# Patient Record
Sex: Female | Born: 1935 | Race: Black or African American
Health system: Southern US, Community
[De-identification: ages and names within clinical notes are randomized; demographics above are authoritative.]

## PROBLEM LIST (undated history)

## (undated) DIAGNOSIS — C8400 Mycosis fungoides, unspecified site: Secondary | ICD-10-CM

## (undated) DIAGNOSIS — I10 Essential (primary) hypertension: Secondary | ICD-10-CM

## (undated) DIAGNOSIS — E039 Hypothyroidism, unspecified: Secondary | ICD-10-CM

## (undated) DIAGNOSIS — H409 Unspecified glaucoma: Secondary | ICD-10-CM

## (undated) DIAGNOSIS — R569 Unspecified convulsions: Secondary | ICD-10-CM

## (undated) DIAGNOSIS — R32 Unspecified urinary incontinence: Secondary | ICD-10-CM

## (undated) DIAGNOSIS — K219 Gastro-esophageal reflux disease without esophagitis: Secondary | ICD-10-CM

## (undated) DIAGNOSIS — I639 Cerebral infarction, unspecified: Secondary | ICD-10-CM

## (undated) DIAGNOSIS — E7800 Pure hypercholesterolemia, unspecified: Secondary | ICD-10-CM

## (undated) HISTORY — DX: Cerebral infarction, unspecified: I63.9

## (undated) HISTORY — DX: Unspecified convulsions: R56.9

## (undated) HISTORY — DX: Pure hypercholesterolemia, unspecified: E78.00

## (undated) HISTORY — DX: Essential (primary) hypertension: I10

## (undated) HISTORY — PX: JOINT REPLACEMENT: SHX530

---

## 2011-08-04 DIAGNOSIS — I10 Essential (primary) hypertension: Secondary | ICD-10-CM | POA: Insufficient documentation

## 2011-08-04 DIAGNOSIS — E785 Hyperlipidemia, unspecified: Secondary | ICD-10-CM | POA: Insufficient documentation

## 2017-04-27 DIAGNOSIS — H401132 Primary open-angle glaucoma, bilateral, moderate stage: Secondary | ICD-10-CM | POA: Insufficient documentation

## 2018-09-12 DIAGNOSIS — C8405 Mycosis fungoides, lymph nodes of inguinal region and lower limb: Secondary | ICD-10-CM | POA: Insufficient documentation

## 2019-01-29 HISTORY — PX: HAND, CLOSED REDUCTION: SHX4185

## 2019-07-31 DIAGNOSIS — K921 Melena: Secondary | ICD-10-CM | POA: Insufficient documentation

## 2019-07-31 DIAGNOSIS — G4089 Other seizures: Secondary | ICD-10-CM | POA: Insufficient documentation

## 2019-07-31 DIAGNOSIS — I639 Cerebral infarction, unspecified: Secondary | ICD-10-CM | POA: Insufficient documentation

## 2021-02-07 ENCOUNTER — Other Ambulatory Visit: Payer: Self-pay

## 2021-02-07 ENCOUNTER — Inpatient Hospital Stay (HOSPITAL_COMMUNITY)
Admission: EM | Admit: 2021-02-07 | Discharge: 2021-02-11 | DRG: 065 | Disposition: A | Discharge status: Skilled Nursing Facility | Payer: Medicare Other | Attending: Internal Medicine | Admitting: Internal Medicine

## 2021-02-07 ENCOUNTER — Encounter (HOSPITAL_COMMUNITY): Payer: Self-pay | Admitting: Neurology

## 2021-02-07 ENCOUNTER — Emergency Department (HOSPITAL_COMMUNITY): Payer: Medicare Other

## 2021-02-07 ENCOUNTER — Observation Stay (HOSPITAL_COMMUNITY): Payer: Medicare Other

## 2021-02-07 DIAGNOSIS — E039 Hypothyroidism, unspecified: Secondary | ICD-10-CM

## 2021-02-07 DIAGNOSIS — R29701 NIHSS score 1: Secondary | ICD-10-CM | POA: Diagnosis present

## 2021-02-07 DIAGNOSIS — I6329 Cerebral infarction due to unspecified occlusion or stenosis of other precerebral arteries: Principal | ICD-10-CM | POA: Diagnosis present

## 2021-02-07 DIAGNOSIS — Z79899 Other long term (current) drug therapy: Secondary | ICD-10-CM

## 2021-02-07 DIAGNOSIS — R32 Unspecified urinary incontinence: Secondary | ICD-10-CM | POA: Diagnosis present

## 2021-02-07 DIAGNOSIS — G459 Transient cerebral ischemic attack, unspecified: Secondary | ICD-10-CM | POA: Diagnosis not present

## 2021-02-07 DIAGNOSIS — I10 Essential (primary) hypertension: Secondary | ICD-10-CM | POA: Diagnosis present

## 2021-02-07 DIAGNOSIS — R4781 Slurred speech: Secondary | ICD-10-CM | POA: Diagnosis present

## 2021-02-07 DIAGNOSIS — R059 Cough, unspecified: Secondary | ICD-10-CM

## 2021-02-07 DIAGNOSIS — R9431 Abnormal electrocardiogram [ECG] [EKG]: Secondary | ICD-10-CM | POA: Diagnosis present

## 2021-02-07 DIAGNOSIS — Z823 Family history of stroke: Secondary | ICD-10-CM

## 2021-02-07 DIAGNOSIS — N390 Urinary tract infection, site not specified: Secondary | ICD-10-CM | POA: Diagnosis present

## 2021-02-07 DIAGNOSIS — I69354 Hemiplegia and hemiparesis following cerebral infarction affecting left non-dominant side: Secondary | ICD-10-CM

## 2021-02-07 DIAGNOSIS — R1310 Dysphagia, unspecified: Secondary | ICD-10-CM | POA: Diagnosis present

## 2021-02-07 DIAGNOSIS — H409 Unspecified glaucoma: Secondary | ICD-10-CM | POA: Diagnosis present

## 2021-02-07 DIAGNOSIS — K5900 Constipation, unspecified: Secondary | ICD-10-CM | POA: Diagnosis present

## 2021-02-07 DIAGNOSIS — Z79890 Hormone replacement therapy (postmenopausal): Secondary | ICD-10-CM

## 2021-02-07 DIAGNOSIS — K219 Gastro-esophageal reflux disease without esophagitis: Secondary | ICD-10-CM | POA: Diagnosis present

## 2021-02-07 DIAGNOSIS — W19XXXA Unspecified fall, initial encounter: Secondary | ICD-10-CM | POA: Diagnosis present

## 2021-02-07 DIAGNOSIS — R471 Dysarthria and anarthria: Secondary | ICD-10-CM | POA: Diagnosis present

## 2021-02-07 DIAGNOSIS — Z8249 Family history of ischemic heart disease and other diseases of the circulatory system: Secondary | ICD-10-CM

## 2021-02-07 DIAGNOSIS — I69392 Facial weakness following cerebral infarction: Secondary | ICD-10-CM

## 2021-02-07 DIAGNOSIS — E785 Hyperlipidemia, unspecified: Secondary | ICD-10-CM | POA: Diagnosis present

## 2021-02-07 DIAGNOSIS — I639 Cerebral infarction, unspecified: Secondary | ICD-10-CM | POA: Diagnosis present

## 2021-02-07 DIAGNOSIS — Z20822 Contact with and (suspected) exposure to covid-19: Secondary | ICD-10-CM | POA: Diagnosis present

## 2021-02-07 DIAGNOSIS — G40909 Epilepsy, unspecified, not intractable, without status epilepticus: Secondary | ICD-10-CM | POA: Diagnosis present

## 2021-02-07 DIAGNOSIS — C8400 Mycosis fungoides, unspecified site: Secondary | ICD-10-CM

## 2021-02-07 DIAGNOSIS — I6350 Cerebral infarction due to unspecified occlusion or stenosis of unspecified cerebral artery: Secondary | ICD-10-CM

## 2021-02-07 DIAGNOSIS — S62101A Fracture of unspecified carpal bone, right wrist, initial encounter for closed fracture: Secondary | ICD-10-CM | POA: Diagnosis present

## 2021-02-07 DIAGNOSIS — Z880 Allergy status to penicillin: Secondary | ICD-10-CM

## 2021-02-07 HISTORY — DX: Unspecified glaucoma: H40.9

## 2021-02-07 HISTORY — DX: Mycosis fungoides, unspecified site: C84.00

## 2021-02-07 HISTORY — DX: Essential (primary) hypertension: I10

## 2021-02-07 HISTORY — DX: Hypothyroidism, unspecified: E03.9

## 2021-02-07 HISTORY — DX: Gastro-esophageal reflux disease without esophagitis: K21.9

## 2021-02-07 HISTORY — DX: Unspecified convulsions: R56.9

## 2021-02-07 HISTORY — DX: Unspecified urinary incontinence: R32

## 2021-02-07 LAB — APTT: aPTT: 29 seconds (ref 24–36)

## 2021-02-07 LAB — I-STAT CHEM 8, ED
BUN: 20 mg/dL (ref 8–23)
Calcium, Ion: 1.11 mmol/L — ABNORMAL LOW (ref 1.15–1.40)
Chloride: 102 mmol/L (ref 98–111)
Creatinine, Ser: 1 mg/dL (ref 0.44–1.00)
Glucose, Bld: 116 mg/dL — ABNORMAL HIGH (ref 70–99)
HCT: 32 % — ABNORMAL LOW (ref 36.0–46.0)
Hemoglobin: 10.9 g/dL — ABNORMAL LOW (ref 12.0–15.0)
Potassium: 3.9 mmol/L (ref 3.5–5.1)
Sodium: 140 mmol/L (ref 135–145)
TCO2: 32 mmol/L (ref 22–32)

## 2021-02-07 LAB — CBC
HCT: 34.8 % — ABNORMAL LOW (ref 36.0–46.0)
Hemoglobin: 11.1 g/dL — ABNORMAL LOW (ref 12.0–15.0)
MCH: 26.3 pg (ref 26.0–34.0)
MCHC: 31.9 g/dL (ref 30.0–36.0)
MCV: 82.5 fL (ref 80.0–100.0)
Platelets: 263 10*3/uL (ref 150–400)
RBC: 4.22 MIL/uL (ref 3.87–5.11)
RDW: 13.8 % (ref 11.5–15.5)
WBC: 4.9 10*3/uL (ref 4.0–10.5)
nRBC: 0 % (ref 0.0–0.2)

## 2021-02-07 LAB — PROTIME-INR
INR: 1 (ref 0.8–1.2)
Prothrombin Time: 13.1 seconds (ref 11.4–15.2)

## 2021-02-07 LAB — URINALYSIS, ROUTINE W REFLEX MICROSCOPIC
Bilirubin Urine: NEGATIVE
Glucose, UA: NEGATIVE mg/dL
Hgb urine dipstick: NEGATIVE
Ketones, ur: NEGATIVE mg/dL
Nitrite: NEGATIVE
Protein, ur: NEGATIVE mg/dL
Specific Gravity, Urine: 1.013 (ref 1.005–1.030)
pH: 7 (ref 5.0–8.0)

## 2021-02-07 LAB — ETHANOL: Alcohol, Ethyl (B): <10 mg/dL (ref <10)

## 2021-02-07 LAB — COMPREHENSIVE METABOLIC PANEL
ALT: 20 U/L (ref 0–44)
AST: 30 U/L (ref 15–41)
Albumin: 3.3 g/dL — ABNORMAL LOW (ref 3.5–5.0)
Alkaline Phosphatase: 62 U/L (ref 38–126)
Anion gap: 10 (ref 5–15)
BUN: 15 mg/dL (ref 8–23)
CO2: 27 mmol/L (ref 22–32)
Calcium: 9 mg/dL (ref 8.9–10.3)
Chloride: 101 mmol/L (ref 98–111)
Creatinine, Ser: 1.01 mg/dL — ABNORMAL HIGH (ref 0.44–1.00)
GFR, Estimated: 52 mL/min — ABNORMAL LOW (ref >60)
Glucose, Bld: 117 mg/dL — ABNORMAL HIGH (ref 70–99)
Potassium: 3.9 mmol/L (ref 3.5–5.1)
Sodium: 138 mmol/L (ref 135–145)
Total Bilirubin: 0.7 mg/dL (ref 0.3–1.2)
Total Protein: 7 g/dL (ref 6.5–8.1)

## 2021-02-07 LAB — DIFFERENTIAL
Abs Immature Granulocytes: 0.02 10*3/uL (ref 0.00–0.07)
Basophils Absolute: 0 10*3/uL (ref 0.0–0.1)
Basophils Relative: 1 %
Eosinophils Absolute: 0.1 10*3/uL (ref 0.0–0.5)
Eosinophils Relative: 1 %
Immature Granulocytes: 0 %
Lymphocytes Relative: 15 %
Lymphs Abs: 0.7 10*3/uL (ref 0.7–4.0)
Monocytes Absolute: 0.5 10*3/uL (ref 0.1–1.0)
Monocytes Relative: 11 %
Neutro Abs: 3.5 10*3/uL (ref 1.7–7.7)
Neutrophils Relative %: 72 %

## 2021-02-07 LAB — LIPID PANEL
Cholesterol: 202 mg/dL — ABNORMAL HIGH (ref 0–200)
HDL: 43 mg/dL (ref >40)
LDL Cholesterol: 138 mg/dL — ABNORMAL HIGH (ref 0–99)
Total CHOL/HDL Ratio: 4.7 RATIO
Triglycerides: 105 mg/dL (ref <150)
VLDL: 21 mg/dL (ref 0–40)

## 2021-02-07 LAB — RAPID URINE DRUG SCREEN, HOSP PERFORMED
Amphetamines: NOT DETECTED
Barbiturates: NOT DETECTED
Benzodiazepines: NOT DETECTED
Cocaine: NOT DETECTED
Opiates: NOT DETECTED
Tetrahydrocannabinol: NOT DETECTED

## 2021-02-07 LAB — HEMOGLOBIN A1C
Hgb A1c MFr Bld: 6.2 % — ABNORMAL HIGH (ref 4.8–5.6)
Mean Plasma Glucose: 131.24 mg/dL

## 2021-02-07 LAB — RESP PANEL BY RT-PCR (FLU A&B, COVID) ARPGX2
Influenza A by PCR: NEGATIVE
Influenza B by PCR: NEGATIVE
SARS Coronavirus 2 by RT PCR: NEGATIVE

## 2021-02-07 IMAGING — CT CT HEAD CODE STROKE
4 series · 16 of 47 positions shown, 18 images · non-contrast
Comparison: None.

CLINICAL DATA: Code stroke.

EXAM:
CT HEAD WITHOUT CONTRAST
TECHNIQUE: Contiguous axial images were obtained from the base of the skull
through the vertex without intravenous contrast.

[Series 3: head wo · axial · 0.43mm/px · z∈[-112,-8]mm · 7 of 29 slices shown, 9 images]
[im 4/29  brain]
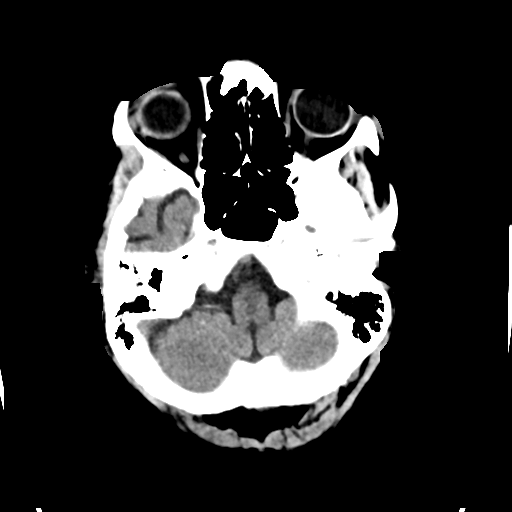
[im 4/29  bone]
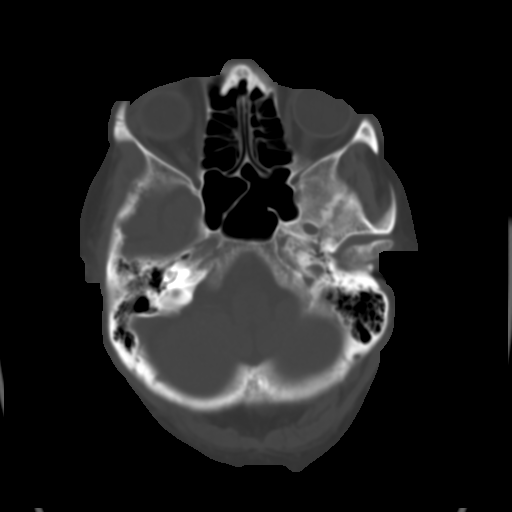
[im 8/29  brain]
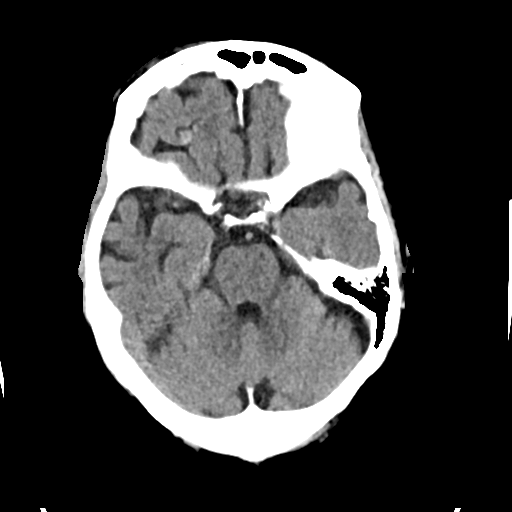
[im 11/29  brain]
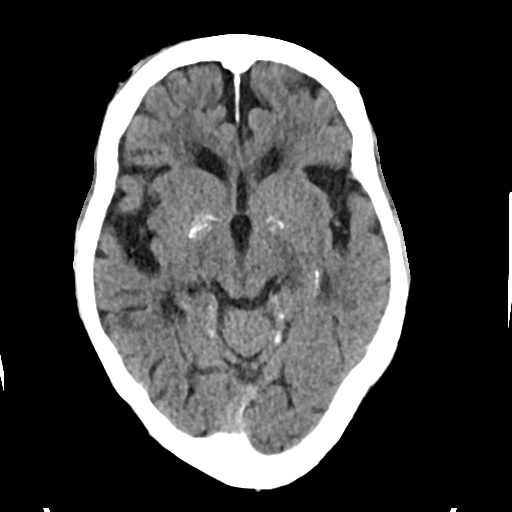
[im 15/29  brain]
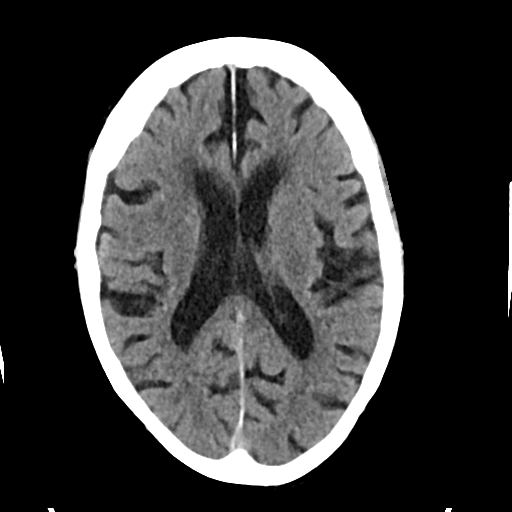
[im 18/29  brain]
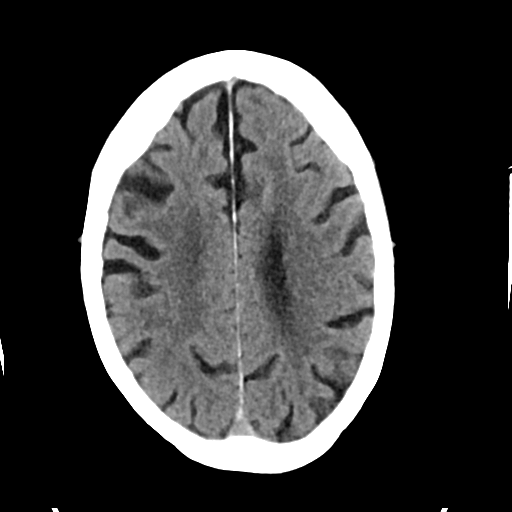
[im 18/29  bone]
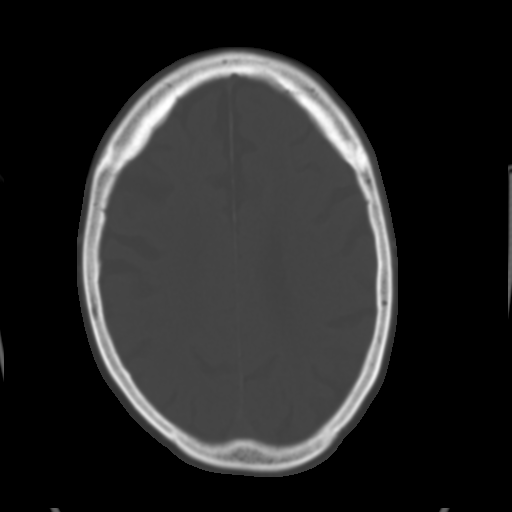
[im 22/29  brain]
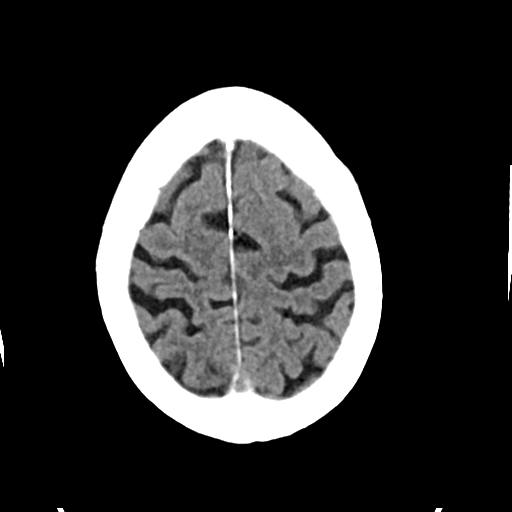
[im 25/29  brain]
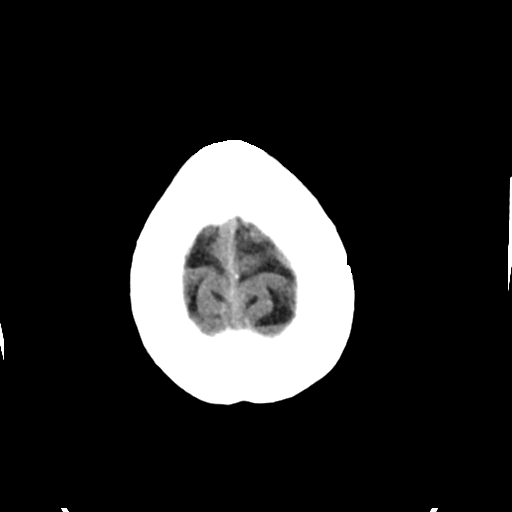

[Series 4: head bone · axial · 0.43mm/px · z∈[-114,-86]mm · 3 of 71 slices shown]
[im 8/71  bone]
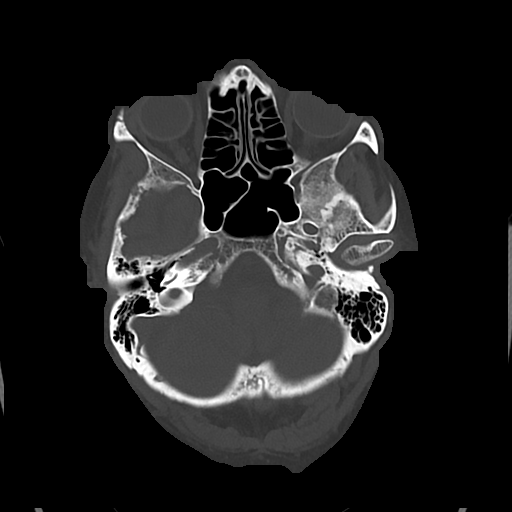
[im 15/71  bone]
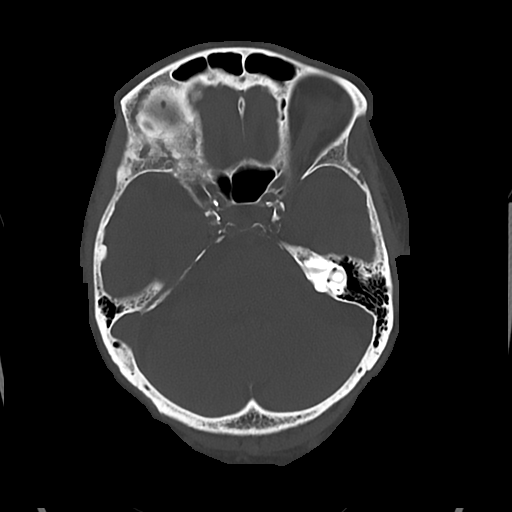
[im 22/71  bone]
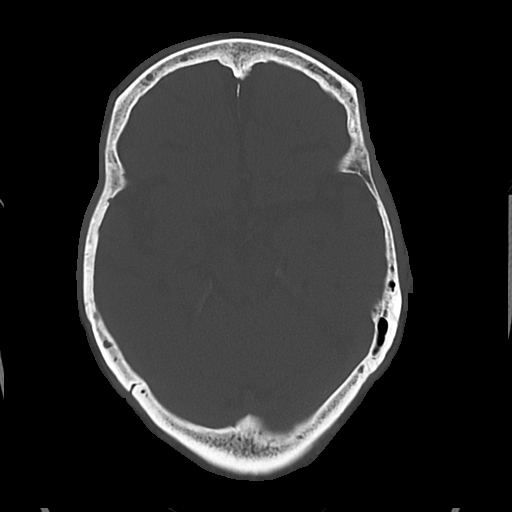

[Series 5: cor soft · coronal · 0.30mm/px · 3 of 68 slices shown]
[im 23/68  brain]
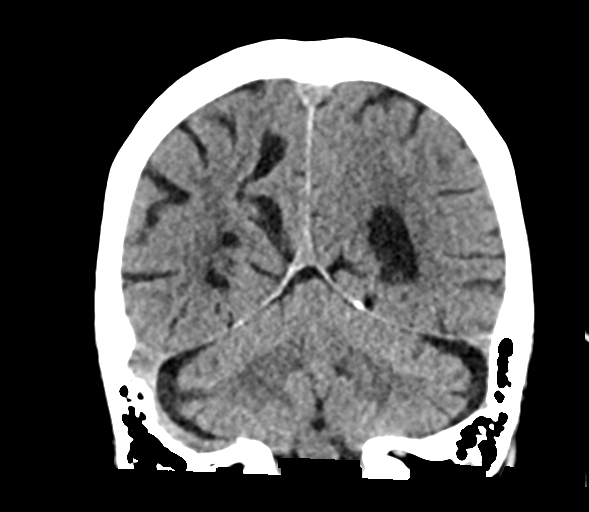
[im 30/68  brain]
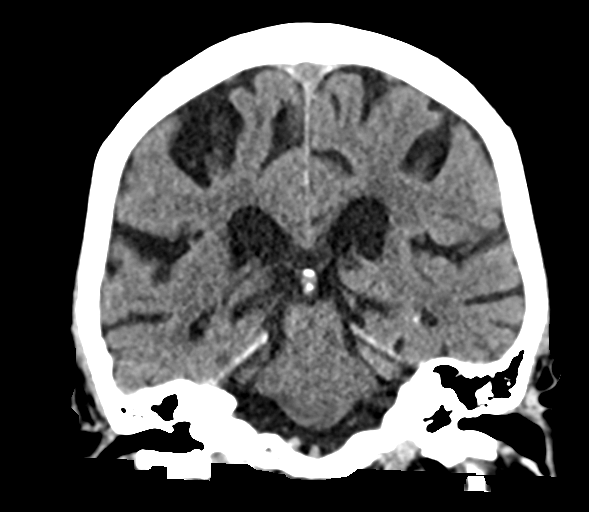
[im 38/68  brain]
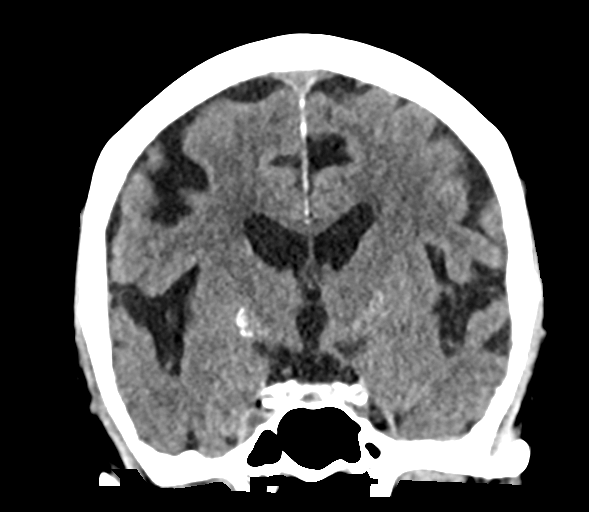

[Series 6: sag soft · sagittal · 0.30mm/px · 3 of 59 slices shown]
[im 20/59  brain]
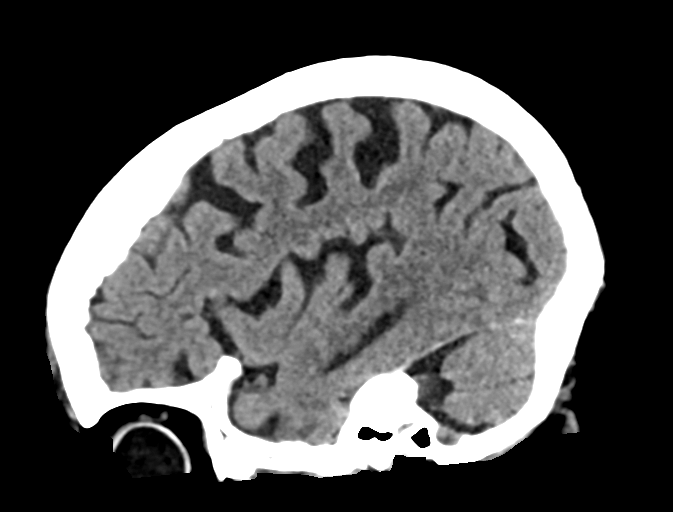
[im 30/59  brain]
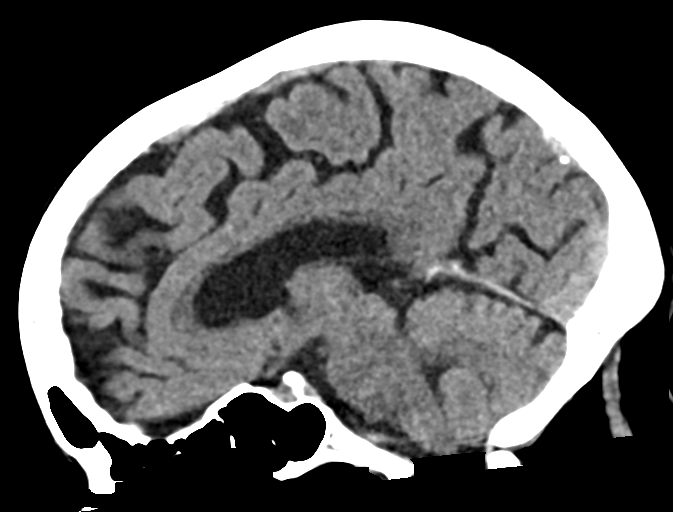
[im 39/59  brain]
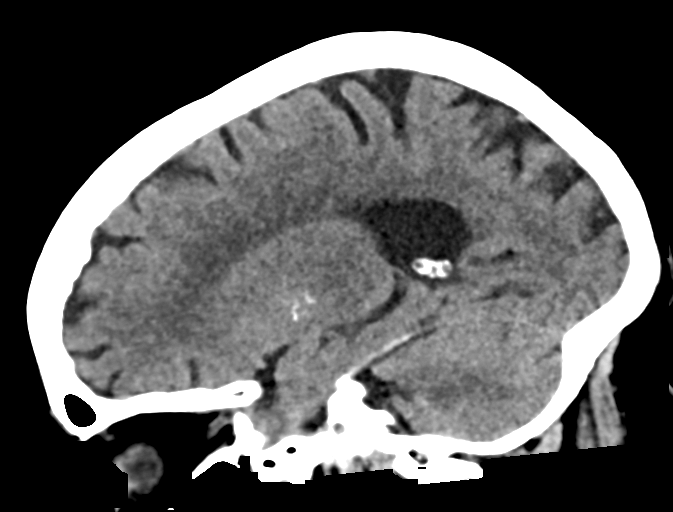

[16 of 47 positions shown; findings below may reference images not displayed]

FINDINGS: Brain: No evidence of acute infarction, hemorrhage, hydrocephalus,
extra-axial collection or mass lesion/mass effect. Generalized brain
atrophy. Chronic small vessel ischemic type low-density in the deep
cerebral white matter.

Vascular: No hyperdense vessel or unexpected calcification.

Skull: Negative for fracture or focal lesion. Subcutaneous
reticulation superior to the right orbit which could be from
contusion or scar.

Sinuses/Orbits: No acute finding.

Other: These results were communicated to Dr BRUNO at [DATE] BRUNO
[DATE]by text page via the [employer] messaging system.

ASPECTS ([place] Stroke Program Early CT Score)

Not scored without localizing symptoms
IMPRESSION: 1. No acute finding.
2. Generalized atrophy and chronic small vessel ischemia.

## 2021-02-07 IMAGING — MR MR HEAD W/O CM
7 of 11 series · 25 of 48 positions shown · non-contrast
Comparison: Head CT from earlier today

CLINICAL DATA: Stroke-like symptoms and left-sided weakness

EXAM:
MRI HEAD WITHOUT CONTRAST
TECHNIQUE: Multiplanar, multiecho pulse sequences of the brain and surrounding
structures were obtained without intravenous contrast.

[Series 2: DWI · axial · 3.0mm · 0.94mm/px · z∈[-85,+66]mm · 7 of 103 slices shown (1 of 2)]
[im 1/103]
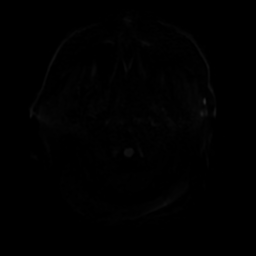
[im 18/103]
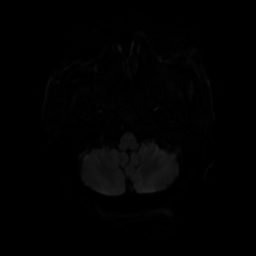
[im 35/103]
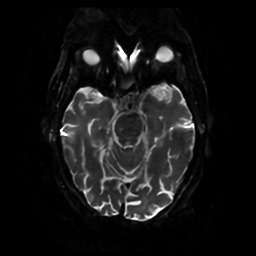
[im 52/103]
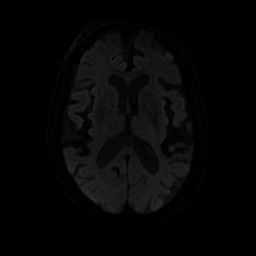
[im 69/103]
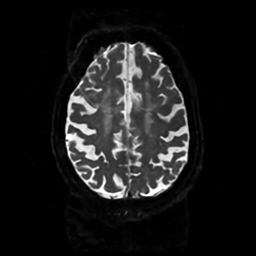
[im 86/103]
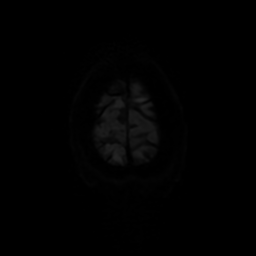
[im 103/103]
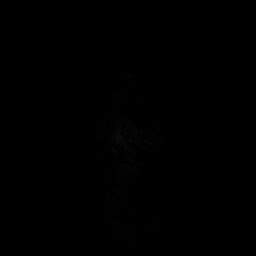

[Series 3: DWI · coronal · 4.0mm · 0.94mm/px · 6 of 77 slices shown (2 of 2)]
[im 1/77]
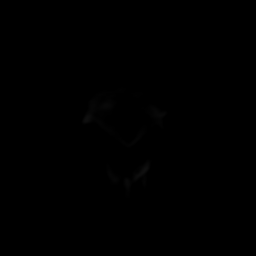
[im 16/77]
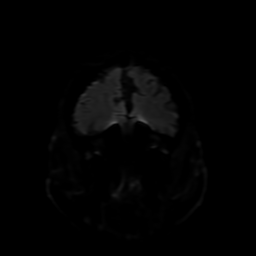
[im 31/77]
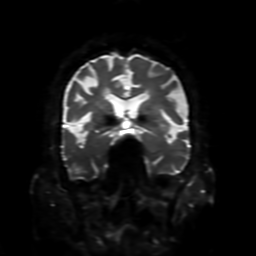
[im 46/77]
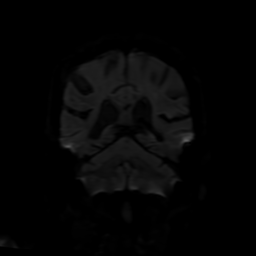
[im 61/77]
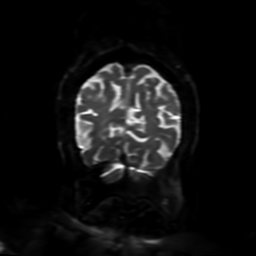
[im 77/77]
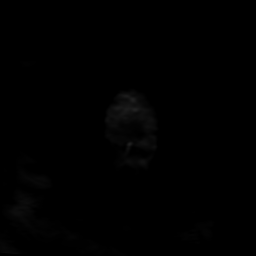

[Series 4: FLAIR · sagittal · 5.0mm · 0.23mm/px · 2 of 26 slices shown (1 of 2)]
[im 1/26]
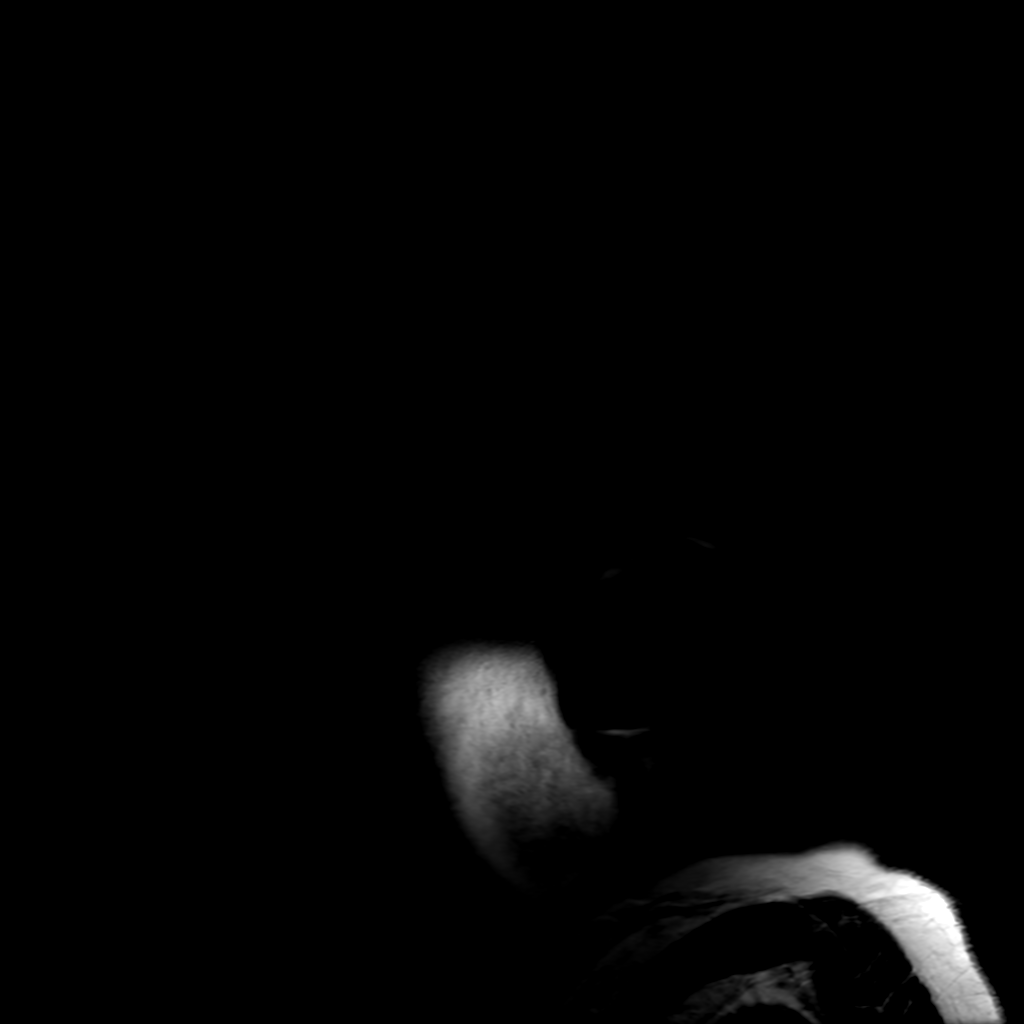
[im 26/26]
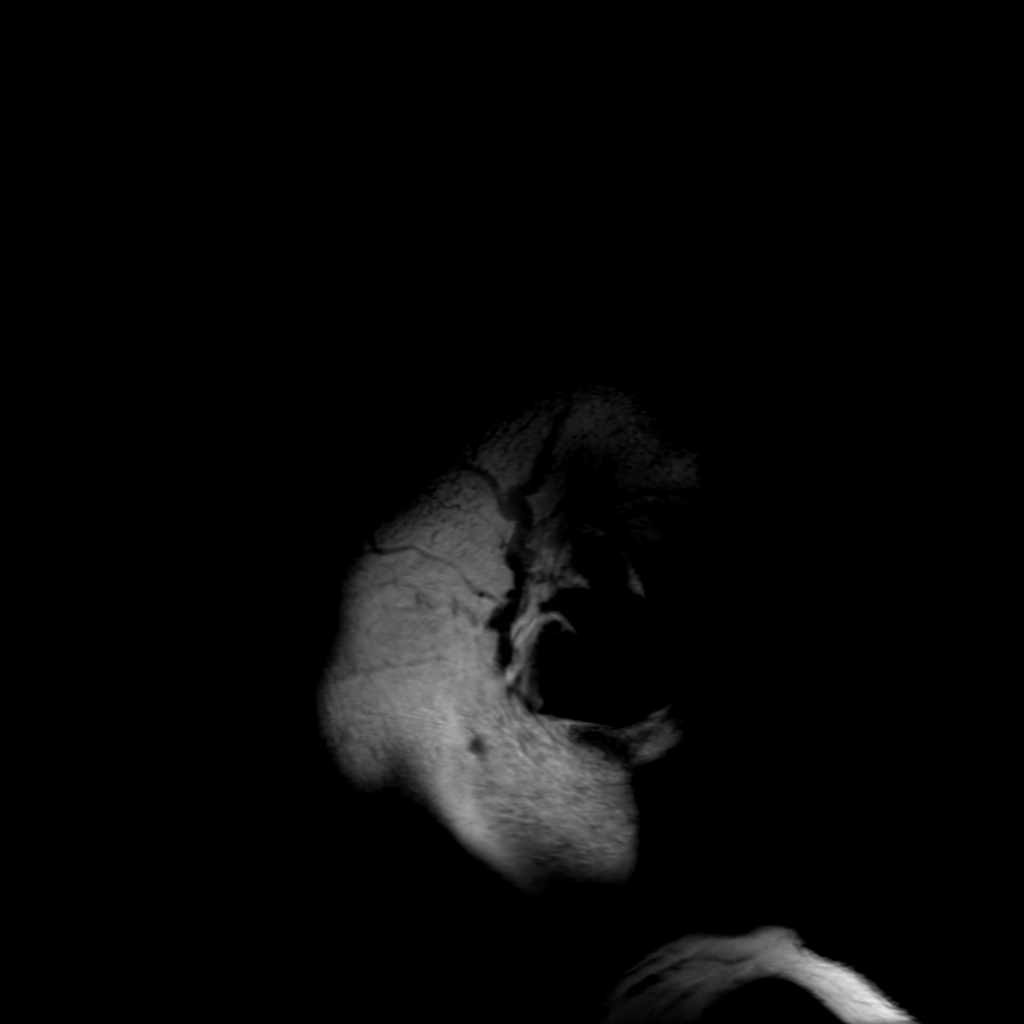

[Series 5: T2 · axial · 5.0mm · 0.23mm/px · 1 of 26 slices shown]
[im 1/26]
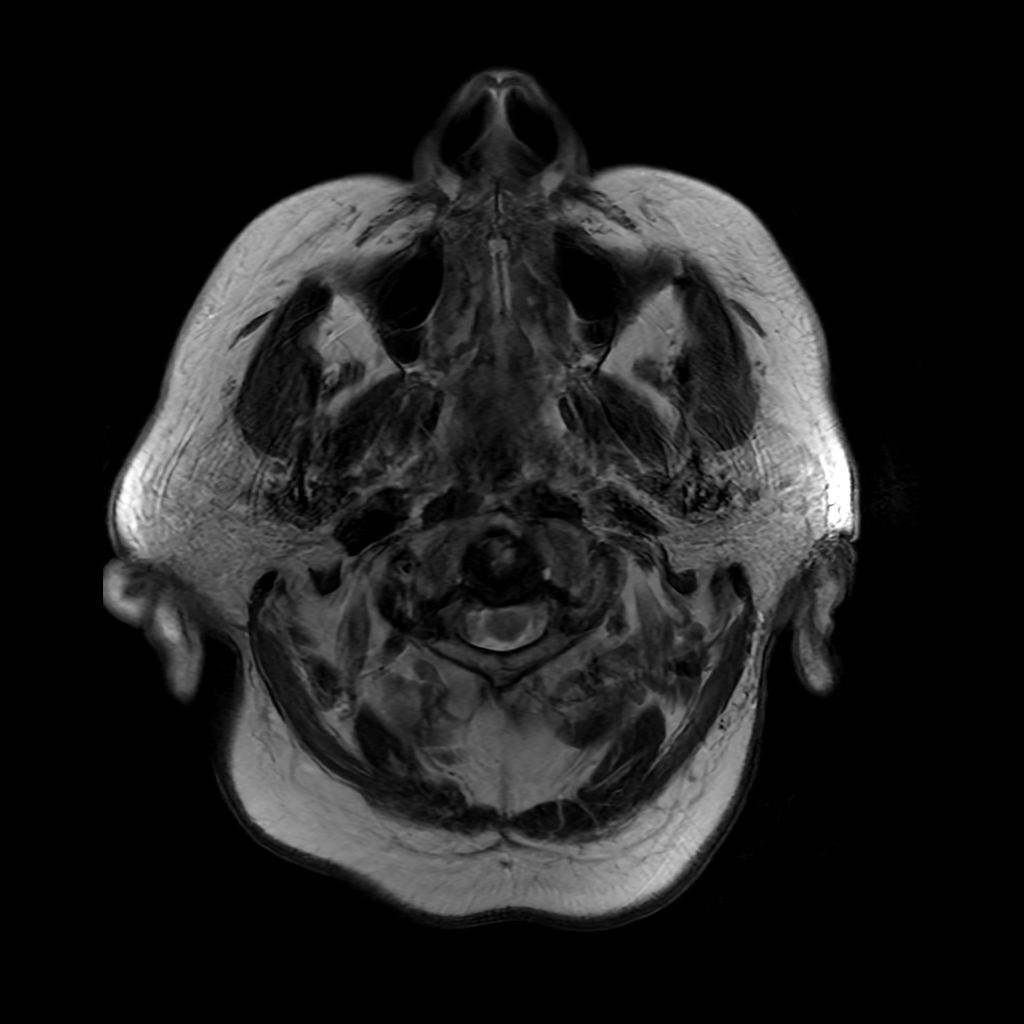

[Series 6: FLAIR · axial · 3.0mm · 0.45mm/px · z∈[-93,+59]mm · 2 of 27 slices shown (2 of 2)]
[im 1/27]
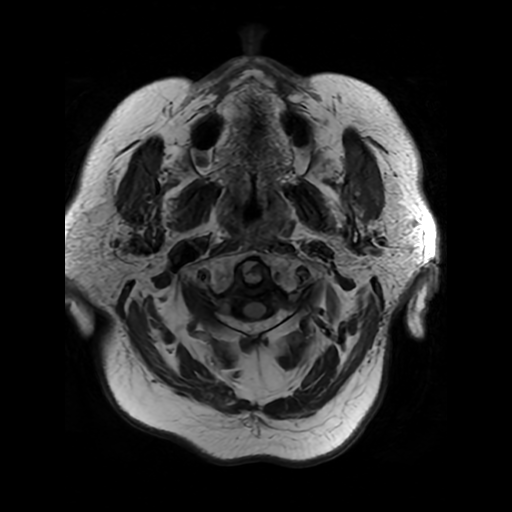
[im 27/27]
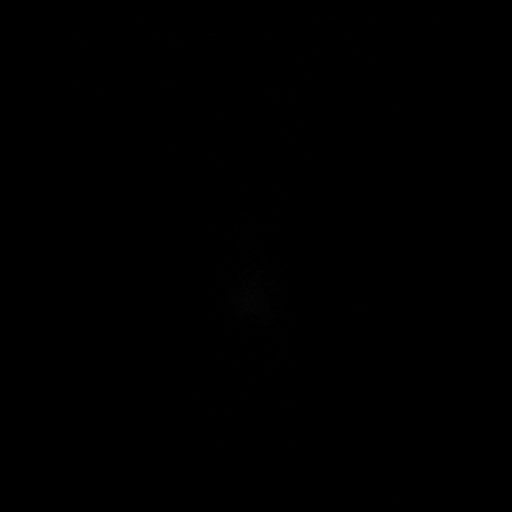

[Series 250: ADC · axial · 3.0mm · 0.94mm/px · z∈[-85,+66]mm · 4 of 52 slices shown (1 of 2)]
[im 1/52]
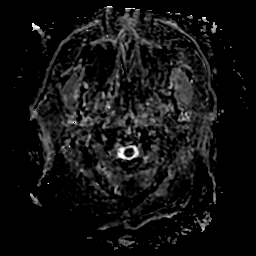
[im 18/52]
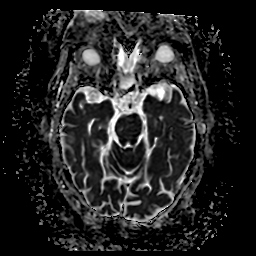
[im 35/52]
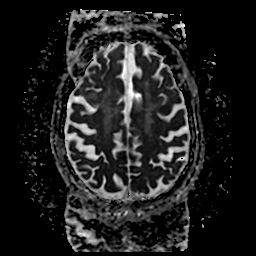
[im 52/52]
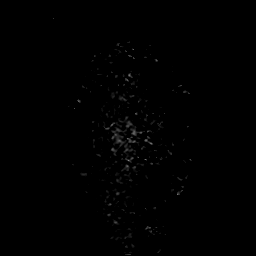

[Series 350: ADC · coronal · 4.0mm · 0.94mm/px · 3 of 38 slices shown (2 of 2)]
[im 1/38]
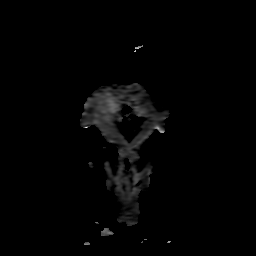
[im 19/38]
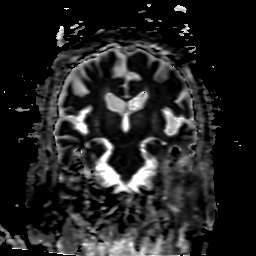
[im 38/38]
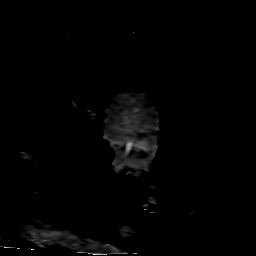

[25 of 48 positions shown; findings below may reference images not displayed]

FINDINGS: Brain: Wedge of acute infarction in the right pons.

Generalized atrophy. Mild-to-moderate periventricular chronic small
vessel ischemia. No collection, hydrocephalus, mass, or acute
hemorrhage

Vascular: Preserved flow voids.

Skull and upper cervical spine: Normal marrow signal. Advanced
cervical spine degeneration with reversal of cervical lordosis and
C3-4, C4-5 retrolisthesis narrowing the spinal canal.

Sinuses/Orbits: Bilateral cataract resection.
IMPRESSION: Acute right pontine infarct.

## 2021-02-07 IMAGING — MR MR MRA HEAD W/O CM
2 series · 18 of 48 positions shown · non-contrast
Comparison: Noncontrast brain MRI performed earlier today
[DATE]. Noncontrast head CT performed earlier today [DATE]

CLINICAL DATA: Stroke, follow-up.

EXAM:
MRA HEAD WITHOUT CONTRAST
TECHNIQUE: Angiographic images of the Circle of Willis were acquired using MRA
technique without intravenous contrast.

[Series 2: ax (id) · axial · 1.0mm · 0.43mm/px · z∈[-60,+21]mm · 17 of 176 slices shown]
[im 1/176]
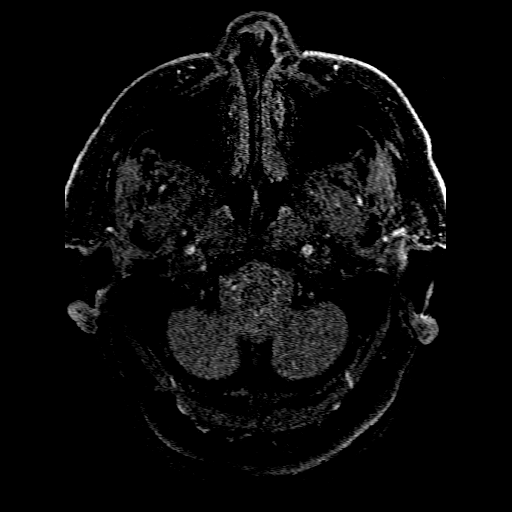
[im 4/176]
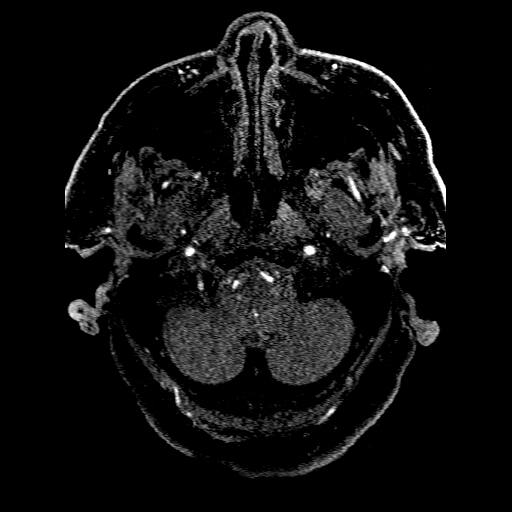
[im 8/176]
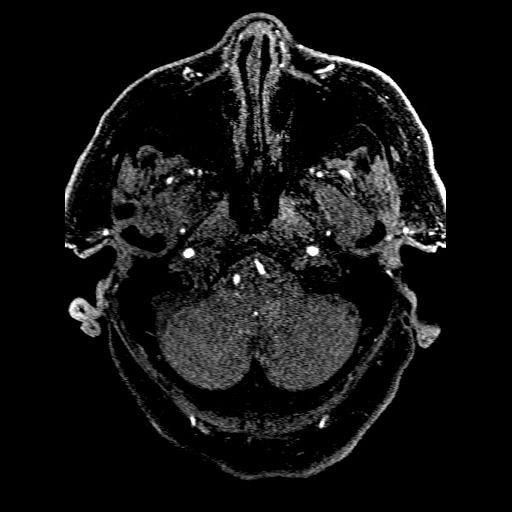
[im 12/176]
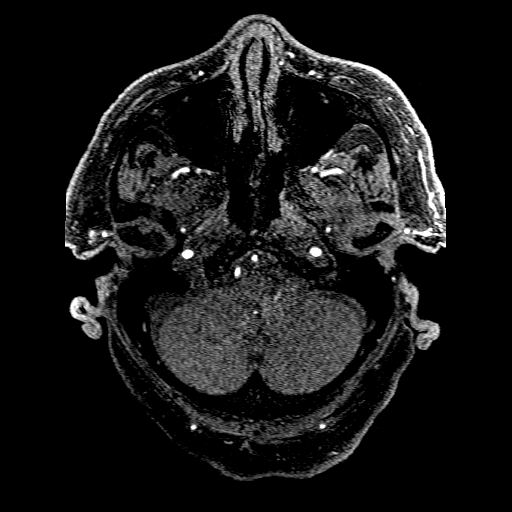
[im 16/176]
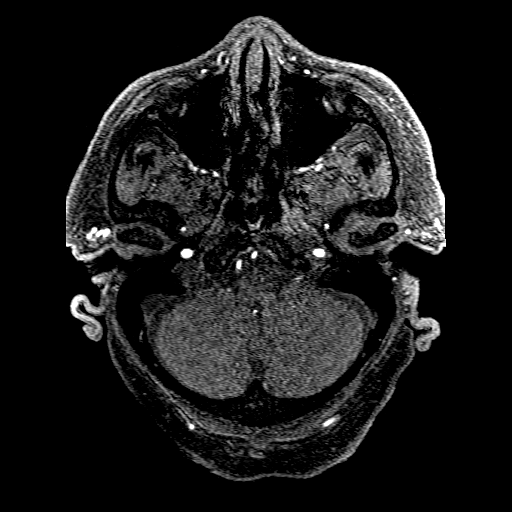
[im 20/176]
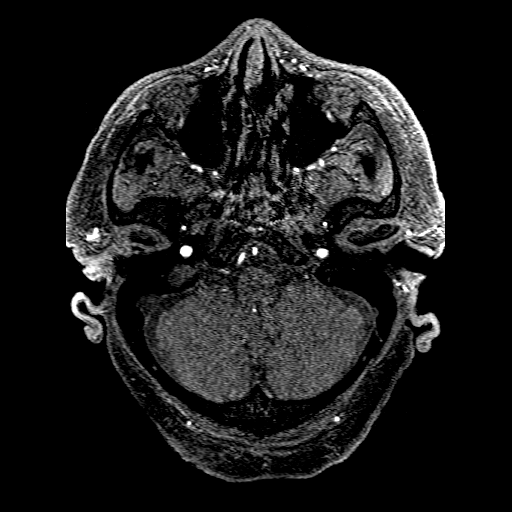
[im 23/176]
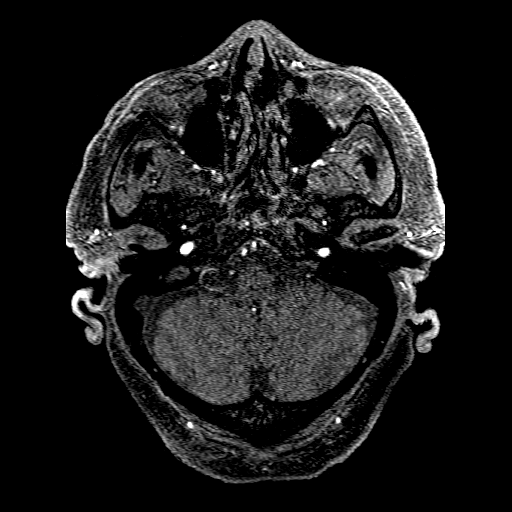
[im 27/176]
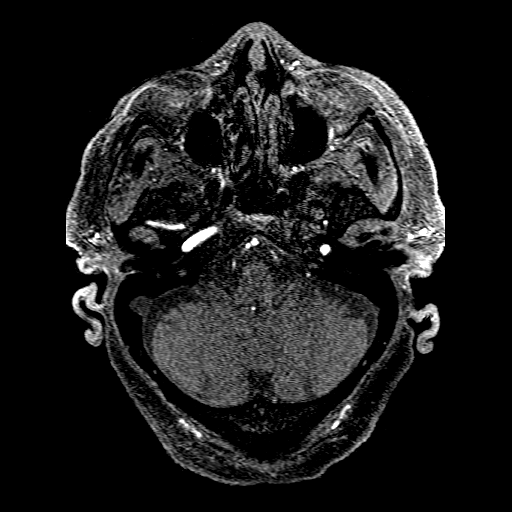
[im 31/176]
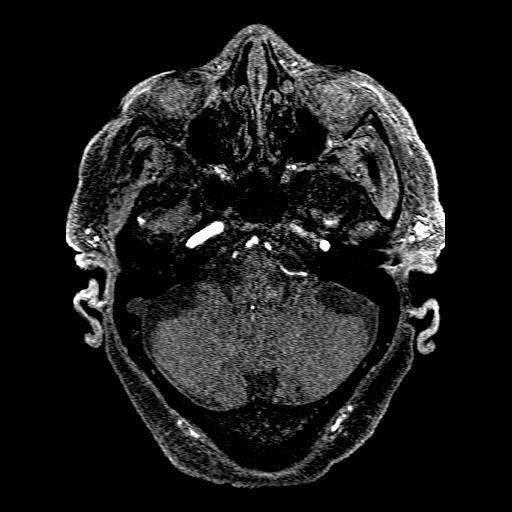
[im 54/176]
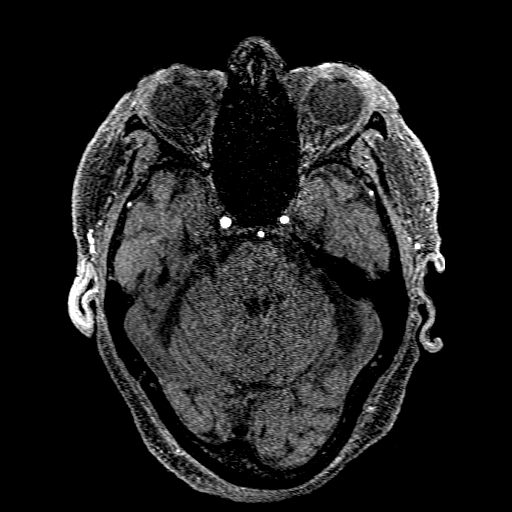
[im 77/176]
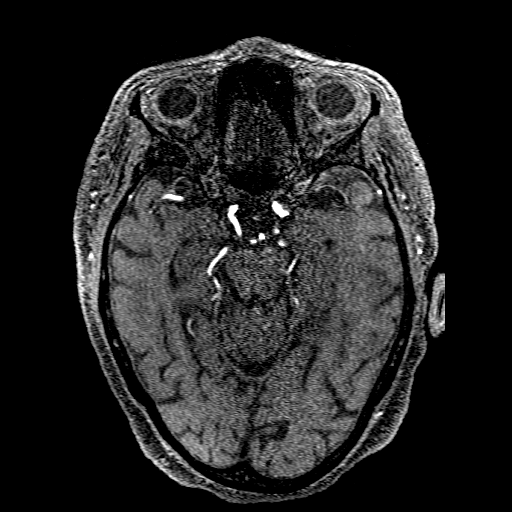
[im 88/176]
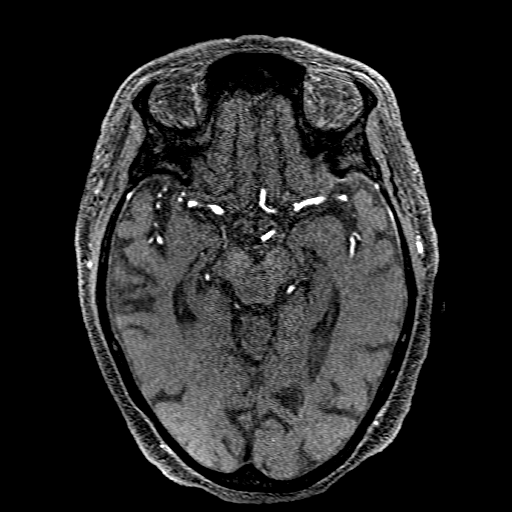
[im 99/176]
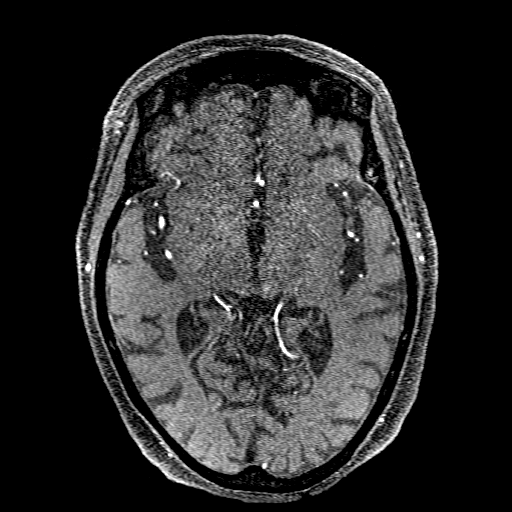
[im 122/176]
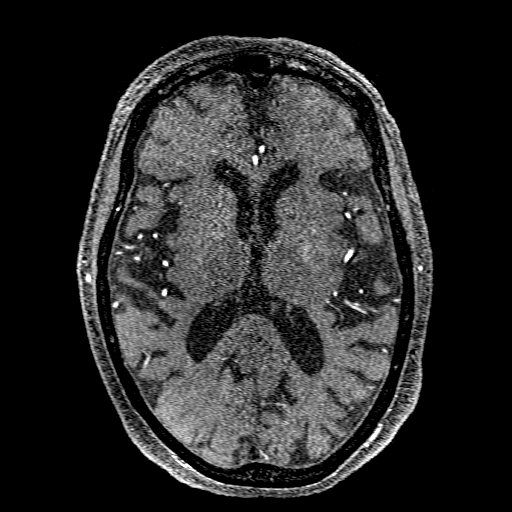
[im 145/176]
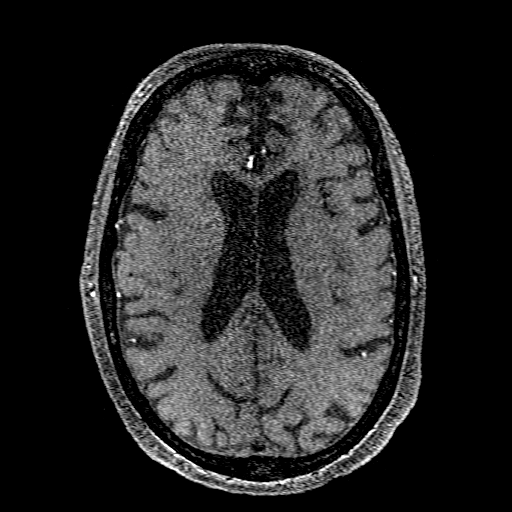
[im 149/176]
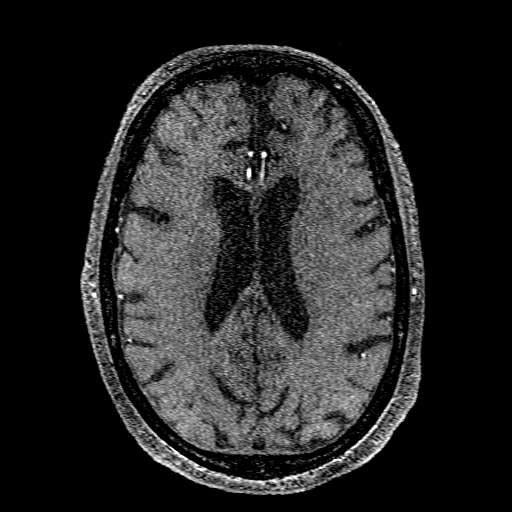
[im 168/176]
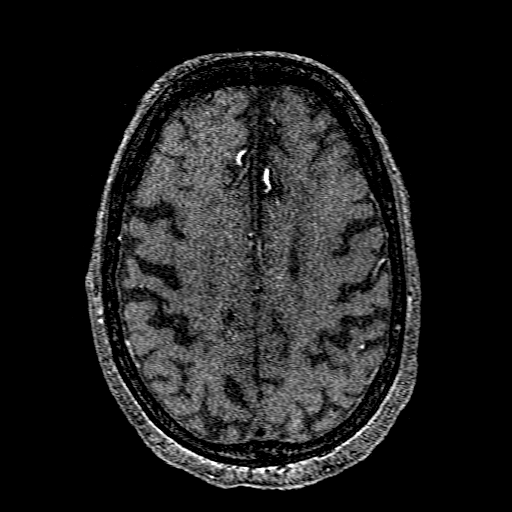

[Series 201: pjn:ax (id) · sagittal · 1.0mm · 0.43mm/px · 1 of 3 slices shown]
[im 1/3]
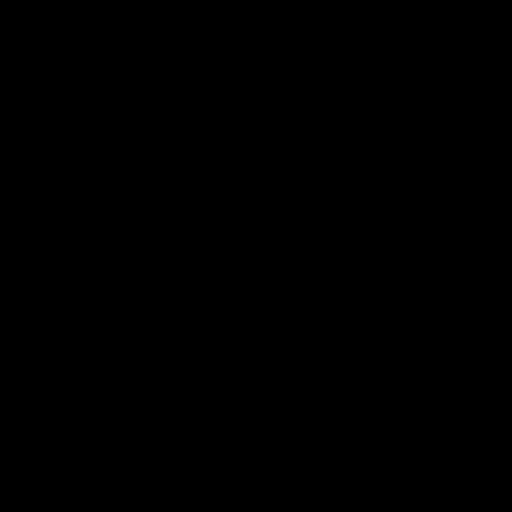

[18 of 48 positions shown; findings below may reference images not displayed]

FINDINGS: Anterior circulation:

The intracranial internal carotid arteries are patent. Mild
atherosclerotic irregularity of both vessels without hemodynamically
significant stenosis. The M1 middle cerebral arteries are patent. No
M2 proximal branch occlusion. The anterior cerebral arteries are
patent. Sites of moderate/severe stenosis within the A2/A3 left
anterior cerebral artery.

2 mm medially projecting vascular protrusion arising from the
cavernous right ICA likely reflecting an aneurysm (series 2, image
65).

1-2 mm inferiorly projecting vascular protrusion arising from the
distal cavernous/paraclinoid right ICA, which could reflect
atherosclerotic irregularity or a small aneurysm (series 2, image
66) (series 257, image 173).

2 mm inferiorly projecting vascular protrusion arising from the
paraclinoid left ICA, which may reflect an aneurysm or the origin of
a partially occluded left posterior communicating artery (series 2,
image 74) series 256, image 200).

Posterior circulation:

The intracranial vertebral arteries are patent. Atherosclerotic
irregularity of both vessels with up to mild stenosis of the
intracranial right vertebral artery, and sites of up to moderate
stenosis of the intracranial left vertebral artery. The basilar
artery is patent. Mild atherosclerotic irregularity of the basilar
artery. The posterior cerebral arteries are patent. Fetal origin
right posterior cerebral artery. Moderate stenosis within the right
PCA at the P1/P2 junction. Moderate stenosis within the P3 right
posterior cerebral artery. There is also atherosclerotic
irregularity of the left posterior cerebral artery. Most notably,
mild/moderate stenoses within the P2 and P3 segments. A left
posterior communicating artery is present but appears partially
occluded.

Anatomic variants: As described
IMPRESSION: 1. Intracranial atherosclerotic disease, as outlined and with
findings most notably as follows.
2. Partially occluded left posterior communicating artery.
3. Sites of up to moderate stenosis within the V4 left vertebral
artery.
4. Moderate stenosis within the right PCA at the P1/P2 junction.
5. Moderate stenosis within the P3 right posterior cerebral artery.
6. Mild/moderate stenoses within the P2 and P3 segments of the left
posterior cerebral artery.
7. Sites of moderate/severe stenosis within the A2/A3 left anterior
cerebral artery.
8. 2 mm medially projecting vascular protrusion arising from the
cavernous right ICA, likely reflecting an aneurysm.
9. 1-2 mm inferiorly projecting vascular protrusion arising from the
distal cavernous/paraclinoid right ICA, which could reflect
atherosclerotic irregularity or a small aneurysm.
10. 2 mm inferiorly projecting vascular protrusion arising from the
paraclinoid left ICA, which may reflect an aneurysm or the origin of
a partially occluded left posterior communicating artery.

## 2021-02-07 MED ORDER — LEVETIRACETAM 500 MG PO TABS
500.0000 mg | ORAL_TABLET | Freq: Two times a day (BID) | ORAL | Status: DC
  Administered 2021-02-07 – 2021-02-11 (×8): 500 mg via ORAL
  Filled 2021-02-07 (×8): qty 1

## 2021-02-07 MED ORDER — ACETAMINOPHEN 650 MG RE SUPP: 650.0000 mg | Freq: Four times a day (QID) | RECTAL | Status: DC | PRN

## 2021-02-07 MED ORDER — POLYETHYLENE GLYCOL 3350 17 G PO PACK: 17.0000 g | PACK | Freq: Every day | ORAL | Status: DC | PRN

## 2021-02-07 MED ORDER — ASPIRIN EC 81 MG PO TBEC
81.0000 mg | DELAYED_RELEASE_TABLET | Freq: Every day | ORAL | Status: DC
  Administered 2021-02-08 – 2021-02-11 (×4): 81 mg via ORAL
  Filled 2021-02-07 (×4): qty 1

## 2021-02-07 MED ORDER — ASPIRIN 300 MG RE SUPP: 300.0000 mg | Freq: Once | RECTAL | Status: DC

## 2021-02-07 MED ORDER — ENOXAPARIN SODIUM 40 MG/0.4ML IJ SOSY
40.0000 mg | PREFILLED_SYRINGE | Freq: Every day | INTRAMUSCULAR | Status: DC
  Administered 2021-02-07 – 2021-02-11 (×5): 40 mg via SUBCUTANEOUS
  Filled 2021-02-07 (×5): qty 0.4

## 2021-02-07 MED ORDER — ACETAMINOPHEN 325 MG PO TABS
650.0000 mg | ORAL_TABLET | Freq: Four times a day (QID) | ORAL | Status: DC | PRN
  Administered 2021-02-07: 650 mg via ORAL
  Filled 2021-02-07: qty 2

## 2021-02-07 MED ORDER — ATORVASTATIN CALCIUM 40 MG PO TABS
40.0000 mg | ORAL_TABLET | Freq: Every day | ORAL | Status: DC
  Administered 2021-02-08 – 2021-02-11 (×4): 40 mg via ORAL
  Filled 2021-02-07 (×4): qty 1

## 2021-02-07 MED ORDER — CLOPIDOGREL BISULFATE 75 MG PO TABS
75.0000 mg | ORAL_TABLET | Freq: Every day | ORAL | Status: DC
  Administered 2021-02-08 – 2021-02-11 (×4): 75 mg via ORAL
  Filled 2021-02-07 (×4): qty 1

## 2021-02-07 NOTE — Hospital Course (Addendum)
Patient is feeling much better this morning and ready to leave hospital back to [place] rehab facility. Explained negative CXR findings, UA findings, and antibiotics for UTI. Discussed logistics of transportation to [place].

## 2021-02-07 NOTE — Consult Note (Addendum)
NEUROLOGY CONSULTATION NOTE   Date of service: Feb 07, 2021 Patient Name: Olivia Sanders MRN:  [id number] DOB:  1923/12/23 Reason for consult: "Stroke code" Requesting Provider: Lennice Sites, DO _ _ _   _ __   _ __ _ _  __ __   _ __   __ _  History of Present Illness  Olivia Sanders is a 87 y.o. female with unclear PMH who presents with confusion since Thursday. Daughter reports that patient was with her niece. Had a fall on Thursday night, went to ED and had a cast placed. Discharged the same day. Next day had some trouble with walking and both legs were weak. Daughter was out of town and returned last night and brought her in to the ED today. She was noted to have LLE weakness in the ED but denies any pain in her leg. Patient and daughter are unable to identify a clear last known well. Daughter reports that patient's niece told her that she has been confused since Thursday with some slurring of speech.  LKW: 02/05/21 NIHSS: 1 for Left leg drift. MRS: 3 TPA/Thrombectomy: Not offered due to unclear LKW, low NIHSS.   ROS   Unable to get a detailed ROS due to the acuity of the situation.  Past History  No past medical history on file.  History reviewed. No pertinent family history. Social History   Socioeconomic History  . Marital status: Widowed    Spouse name: Not on file  . Number of children: Not on file  . Years of education: Not on file  . Highest education level: Not on file  Occupational History  . Occupation: retired  Tobacco Use  . Smoking status: Never Smoker  . Smokeless tobacco: Never Used  Substance and Sexual Activity  . Alcohol use: Never  . Drug use: Never  . Sexual activity: Not Currently  Other Topics Concern  . Not on file  Social History Narrative   Lives with her eldest daughter   Social Determinants of Health   Financial Resource Strain: Not on file  Food Insecurity: Not on file  Transportation Needs: Not on file  Physical Activity: Not on file   Stress: Not on file  Social Connections: Not on file   Not on File  Medications  (Not in a hospital admission)    Vitals   Vitals:   02/07/21 0752 02/07/21 0800 02/07/21 0815 02/07/21 0830  BP: 138/71 137/65 128/68 (!) 144/72  Pulse: 96 91 83 90  Resp: 13 14 14 11   Temp: 98.6 F (37 C)     TempSrc: Oral     SpO2: 99% 98% 99% 99%     There is no height or weight on file to calculate BMI.  Physical Exam   General: Laying comfortably in bed; in no acute distress. HENT: Normal oropharynx and mucosa. Normal external appearance of ears and nose. Neck: Supple, no pain or tenderness CV: No JVD. No peripheral edema. Pulmonary: Symmetric Chest rise. Normal respiratory effort Abdomen: Soft to touch, non-tender. Ext: No cyanosis, edema, or deformity, cast on her R forearm. L leg longer than R leg. Skin: No rash. Normal palpation of skin.  Musculoskeletal: Normal digits and nails by inspection. No clubbing.  Neurologic Examination  Mental status/Cognition: Alert, oriented to self, place, month and year, good attention. Speech/language: Mildly dysarthric(likely from missing teeth)Fluent, comprehension intact, object naming intact, repetition intact. Cranial nerves:   CN II Pupils equal and reactive to light, no VF deficits  CN III,IV,VI EOM intact, no gaze preference or deviation, no nystagmus   CN V normal sensation in V1, V2, and V3 segments bilaterally   CN VII no asymmetry, no nasolabial fold flattening   CN VIII normal hearing to speech   CN IX & X normal palatal elevation, no uvular deviation   CN XI 5/5 head turn and 5/5 shoulder shrug bilaterally   CN XII midline tongue protrusion   Motor:  Muscle bulk: normal, tone increased mildly in BL lower extremities. Mvmt Root Nerve  Muscle Right Left Comments  SA C5/6 Ax Deltoid 4+ 4+ R forearm in cast.  EF C5/6 Mc Biceps  4+   EE C6/7/8 Rad Triceps  4+   WF C6/7 Med FCR     WE C7/8 PIN ECU     F Ab C8/T1 U ADM/FDI  4+    HF L1/2/3 Fem Illopsoas 4+ 4   KE L2/3/4 Fem Quad     DF L4/5 D Peron Tib Ant 4+ 2   PF S1/2 Tibial Grc/Sol 4+ 4    Reflexes:  Right Left Comments  Pectoralis      Biceps (C5/6) 2 2   Brachioradialis (C5/6) 2 2    Triceps (C6/7) 2 2    Patellar (L3/4) 2 2    Achilles (S1)      Hoffman      Plantar     Jaw jerk    Sensation:  Light touch Intact throughout   Pin prick    Temperature    Vibration   Proprioception    Coordination/Complex Motor:  - Finger to Nose intact with LUE, R arm in cast. - Heel to shin unable to do - Rapid alternating movement are slowed. - Gait: Deferred.  Labs   CBC:  Recent Labs  Lab 02/07/21 0831 02/07/21 0847  WBC 4.9  --   NEUTROABS 3.5  --   HGB 11.1* 10.9*  HCT 34.8* 32.0*  MCV 82.5  --   PLT 263  --     Basic Metabolic Panel:  Lab Results  Component Value Date   NA 140 02/07/2021   K 3.9 02/07/2021   CO2 27 02/07/2021   GLUCOSE 116 (H) 02/07/2021   BUN 20 02/07/2021   CREATININE 1.00 02/07/2021   CALCIUM 9.0 02/07/2021   GFRNONAA 52 (L) 02/07/2021   Lipid Panel: No results found for: LDLCALC HgbA1c: No results found for: HGBA1C Urine Drug Screen: No results found for: LABOPIA, COCAINSCRNUR, LABBENZ, AMPHETMU, THCU, LABBARB  Alcohol Level No results found for: [place]  CT Head without contrast: CTH was negative for a large hypodensity concerning for a large territory infarct or hyperdensity concerning for an ICH  MRI Brain: pending  Impression   Olivia Sanders is a 87 y.o. female with PMH significant for HTN p/w fall 2 days ago and unable to walk since yesterday with her walker and LLE weakness noticed on exam today with weak hip flexion, dorsiflexion mostly and planter flexion to lesser extent. Her L leg appears slightly longer than her R leg.   Stroke code was called with unclear LKW but on discussion with patient and daughter, this has been going on since 5/12.  With LLE weakness, lacunar stroke is certainly a  possibility, specially with no pain in her leg.  Recommendations  - Recommend starting with MRI Brain without contrast. - I would only do full stroke workup if the MRI Brain is positive for an acute stroke. - Would benefit from PT for  L Leg weakness. - Continue aspirin 81mg  daily - outpatient workup if MRI Brain is negative. ______________________________________________________________________   [place] Pager Number [phone] 02/07/2021  9:51 AM   Thank you for the opportunity to take part in the care of this patient. If you have any further questions, please contact the neurology consultation attending.  Signed,  [place] Pager Number [phone] _ _ _   _ __   _ __ _ _  __ __   _ __   __ _

## 2021-02-07 NOTE — H&P (Addendum)
Date: 02/07/2021               Patient Name:  Olivia Sanders MRN: [id number]  DOB: 1924-05-31 Age / Sex: 87 y.o., female   PCP: No primary care provider on file.              Medical Service: Internal Medicine Teaching Service              Attending Physician: Dr. Velna Ochs, MD    First Contact: Ethelene Hal, MS3 Pager: [phone]  Second Contact: Dr. Iona Beard Pager: [phone]  Third Contact Dr. Maudie Mercury Pager: [phone]       After Hours (After 5p/  First Contact Pager: [phone]  weekends / holidays): Second Contact Pager: [phone]   Chief Complaint: Weakness  History of Present Illness:  Olivia Sanders is a 87 y.o. female who presented to the ED with her daughter Olivia Sanders with complaints of weakness. Two days ago, she fell while taking out the trash and broke her right wrist. Since that time, she has endorsed generalized weakness. She believes some weakness in her left leg could be due to having a left hip replacement. Today, her daughter picked her up from her hometown in [place], [place], to come to her great granddaughter's graduation from [employer].  This morning, she states that could not move and that she felt very slow. She knew something was wrong because she required three people to assist her out of the bed. She was also unable to lift her arms this morning or hold food in her mouth during her dinner last night. Her daughter notes that today, the left side of her face was drooping when she smiled and that she has been slurring her words. She has noticed that over time in the ED, her mother's slurred speech had improved; however, after getting a CT scan, she began to slur her words again. 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 She states that they never discovered what caused the coma.  Ms. Olivia Sanders has had some constipation, but it is not uncommon for her. She denies any vision changes aside from her cataracts. She  has been progressively losing her hearing for a while. She denies any urinary changes aside from urinating on herself once in the ED. She denies any fever, chills, nausea, vomiting, or diarrhea. Since she is from out of town, her daughter would like to get her moved to a hospital in [place] when able.  ED Course: Patient had normal vitals in ED. Code stroke was initiated. Head CT showed no acute findings and patient outside window for tPA, so code stroke canceled. However, MRI showed acute right pontine infarct. Other lab work unremarkable. Neurology consulted.  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 Penicillins  02/07/2021   Past Medical History:  Diagnosis Date  . GERD (gastroesophageal reflux disease)   . Glaucoma   . Hypertension   . Hypothyroidism   . 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 Seizures ([place])   . Urinary incontinence    Family History: Stroke: Father CV: None HTN: Sister  DM: Sister, mother Cancer: Mycosis fungoides (self), Sister (Unsure what kind)  Social History: Ms. Olivia Sanders lives in [place], [place]. 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 She denies any tobacco use. She used to drink alcohol when she was younger, but not endorses maybe one drink per year. She denies any other substance use.  Review of Systems: A complete ROS was negative except as per HPI.  Physical Exam: Blood pressure (!) 121/57, pulse 79, temperature 98.6 F (37 C), temperature source Oral, resp. rate 14, SpO2 98 %.  Physical Exam Constitutional:       General: She is not in acute distress.    Appearance: Normal appearance.  HENT:     Head: Normocephalic and atraumatic.  Eyes:     Extraocular Movements: Extraocular movements intact.     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   Cardiovascular:     Rate and Rhythm: Normal rate and regular rhythm.     Pulses: Normal pulses.     Heart sounds: Normal heart sounds.  Pulmonary:     Effort: Pulmonary effort is normal.     Breath sounds: Normal breath sounds.  Abdominal:     General: Abdomen is flat. Bowel sounds are normal.     Palpations: Abdomen is soft.  Musculoskeletal:     Left wrist: Normal.     Cervical back: Normal range of motion and neck supple.     Comments: Cast on R wrist due to fracture from fall. 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  Skin:    General: Skin is warm and dry.  Neurological:     Mental Status: She is alert and oriented to person, place, and time.     Sensory: Sensation is intact.     Comments: Left-sided facial droop, strength 3/5 in RUE 2/2 wrist pain, strength 5/5 LUE, strength 5/5 in bilateral lower extremities  Psychiatric:        Attention and Perception: Attention normal.        Mood and Affect: Mood and affect normal.        Speech: Speech is slurred.        Behavior: Behavior normal. Behavior is cooperative.        Thought Content: Thought content normal.    Recent Results (from the past 2160 hour(s))  Ethanol     Status: None   Collection Time: 02/07/21  8:31 AM  Result Value Ref Range   Alcohol, Ethyl (B) <10 <10 mg/dL    Comment: (NOTE) Lowest detectable limit for serum alcohol is 10 mg/dL.  For medical purposes only. Performed at [facility], [place] [address], [place], [place] [address]   Protime-INR     Status: None   Collection Time: 02/07/21  8:31 AM  Result Value Ref Range   Prothrombin Time 13.1 11.4 - 15.2 seconds   INR 1.0 0.8 - 1.2    Comment: (NOTE) INR goal varies based on device and disease  states. Performed at [facility], [place] [name] [place], [place] [address]   APTT     Status: None   Collection Time: 02/07/21  8:31 AM  Result Value Ref Range   aPTT 29 24 - 36 seconds    Comment: Performed at [facility] [address] [place], [place] [address]  CBC     Status: Abnormal   Collection Time: 02/07/21  8:31 AM  Result Value Ref Range   WBC  4.9 4.0 - 10.5 K/uL   RBC 4.22 3.87 - 5.11 MIL/uL   Hemoglobin 11.1 (L) 12.0 - 15.0 g/dL   HCT 34.8 (L) 36.0 - 46.0 %   MCV 82.5 80.0 - 100.0 fL   MCH 26.3 26.0 - 34.0 pg   MCHC 31.9 30.0 - 36.0 g/dL   RDW 13.8 11.5 - 15.5 %   Platelets 263 150 - 400 K/uL   nRBC 0.0 0.0 - 0.2 %    Comment: Performed at [facility], [place] [address] [place], [place] [address]  Differential     Status: None   Collection Time: 02/07/21  8:31 AM  Result Value Ref Range   Neutrophils Relative % 72 %   Neutro Abs 3.5 1.7 - 7.7 K/uL   Lymphocytes Relative 15 %   Lymphs Abs 0.7 0.7 - 4.0 K/uL   Monocytes Relative 11 %   Monocytes Absolute 0.5 0.1 - 1.0 K/uL   Eosinophils Relative 1 %   Eosinophils Absolute 0.1 0.0 - 0.5 K/uL   Basophils Relative 1 %   Basophils Absolute 0.0 0.0 - 0.1 K/uL   Immature Granulocytes 0 %   Abs Immature Granulocytes 0.02 0.00 - 0.07 K/uL    Comment: Performed at [facility] [address] [place], [place] [address]  Comprehensive metabolic panel     Status: Abnormal   Collection Time: 02/07/21  8:31 AM  Result Value Ref Range   Sodium 138 135 - 145 mmol/L   Potassium 3.9 3.5 - 5.1 mmol/L   Chloride 101 98 - 111 mmol/L   CO2 27 22 - 32 mmol/L   Glucose, Bld 117 (H) 70 - 99 mg/dL    Comment: Glucose reference range applies only to samples taken after fasting for at least 8 hours.   BUN 15 8 - 23 mg/dL   Creatinine, Ser 1.01 (H) 0.44 - 1.00 mg/dL   Calcium 9.0 8.9 - 10.3 mg/dL   Total Protein 7.0 6.5 - 8.1 g/dL   Albumin 3.3 (L) 3.5 - 5.0 g/dL   AST 30 15 - 41 U/L   ALT 20 0 - 44  U/L   Alkaline Phosphatase 62 38 - 126 U/L   Total Bilirubin 0.7 0.3 - 1.2 mg/dL   GFR, Estimated 52 (L) >60 mL/min    Comment: (NOTE) Calculated using the CKD-EPI Creatinine Equation (2021)    Anion gap 10 5 - 15    Comment: Performed at [facility] [address] [place], [place] [address]  I-stat chem 8, ED     Status: Abnormal   Collection Time: 02/07/21  8:47 AM  Result Value Ref Range   Sodium 140 135 - 145 mmol/L   Potassium 3.9 3.5 - 5.1 mmol/L   Chloride 102 98 - 111 mmol/L   BUN 20 8 - 23 mg/dL   Creatinine, Ser 1.00 0.44 - 1.00 mg/dL   Glucose, Bld 116 (H) 70 - 99 mg/dL    Comment: Glucose reference range applies only to samples taken after fasting for at least 8 hours.   Calcium, Ion 1.11 (L) 1.15 - 1.40 mmol/L   TCO2 32 22 - 32 mmol/L   Hemoglobin 10.9 (L) 12.0 - 15.0 g/dL   HCT 32.0 (L) 36.0 - 46.0 %  Urine rapid drug screen (hosp performed)     Status: None   Collection Time: 02/07/21  1:00 PM  Result Value Ref Range   Opiates NONE DETECTED NONE DETECTED  Cocaine NONE DETECTED NONE DETECTED   Benzodiazepines NONE DETECTED NONE DETECTED   Amphetamines NONE DETECTED NONE DETECTED   Tetrahydrocannabinol NONE DETECTED NONE DETECTED   Barbiturates NONE DETECTED NONE DETECTED    Comment: (NOTE) DRUG SCREEN FOR MEDICAL PURPOSES ONLY.  IF CONFIRMATION IS NEEDED FOR ANY PURPOSE, NOTIFY LAB WITHIN 5 DAYS.  LOWEST DETECTABLE LIMITS FOR URINE DRUG SCREEN Drug Class                     Cutoff (ng/mL) Amphetamine and metabolites    1000 Barbiturate and metabolites    200 Benzodiazepine                 [id number] Tricyclics and metabolites     300 Opiates and metabolites        300 Cocaine and metabolites        300 THC                            50 Performed at [facility], [place] [address], [place], [place] [address]   Urinalysis, Routine w reflex microscopic Urine, Clean Catch     Status: Abnormal   Collection Time: 02/07/21  1:00 PM  Result Value Ref  Range   Color, Urine YELLOW YELLOW   APPearance HAZY (A) CLEAR   Specific Gravity, Urine 1.013 1.005 - 1.030   pH 7.0 5.0 - 8.0   Glucose, UA NEGATIVE NEGATIVE mg/dL   Hgb urine dipstick NEGATIVE NEGATIVE   Bilirubin Urine NEGATIVE NEGATIVE   Ketones, ur NEGATIVE NEGATIVE mg/dL   Protein, ur NEGATIVE NEGATIVE mg/dL   Nitrite NEGATIVE NEGATIVE   Leukocytes,Ua SMALL (A) NEGATIVE   RBC / HPF 0-5 0 - 5 RBC/hpf   WBC, UA 0-5 0 - 5 WBC/hpf   Bacteria, UA RARE (A) NONE SEEN   Squamous Epithelial / LPF 0-5 0 - 5    Comment: Performed at [facility], [place] [address], [place], [place] [address]   EKG: sinus rhythm, probable LAE and old anteroseptal infarct  CT Head WO Contrast: No acute finding. Generalized atrophy and chronic small vessel ischemia.  MR Brain WO Contrast: Acute right pontine infarct.  Assessment & Plan by Problem: Active Problems:   Stroke ([place])  1. Stroke Patient is HDS and in NAD. She has been experiencing weakness for 2 days now that likely played a role in her fall. She does have history of hypertension as well as a neurological history of coma of unknown etiology and Keppra for unknown type of seizures. Labs largely unremarkable. MRI demonstrates acute right pontine infarct. Given imaging findings and persistence of symptoms, stroke is more likely than CVA. Neurology consulted and provided recommendations, appreciate assistance. -Serial neurological assessments -MR head without contrast -MRA head without contrast, US carotid doppler to assess thrombotic source of stroke -TTE to assess cardiac embolic source of stroke -Telemetry to monitor for arrythmia -Consult PT, OT, and SLP with swallow evaluation -NPO until after swallow study -Lipid panel with LDL, Hgb A1c -Hold baby aspirin due to NPO status -Lovenox 40 mg for DVT prophylaxis  2. Wrist fracture Patient fell on wrist 2 days ago. She is in cast. Marked pain on movement. She has been taking tramadol for  pain control. -Hold oral tramadol due to NPO -IV Toradol 7.5 mg prn for pain  3. Hypertension Patient has history of hypertension. BP is 136/59. Would like BP slightly higher to ensure  remaining perfusion in setting of stroke. -Hold home amlodipine, furosemide, clonidine, and losartan due to BP and NPO status  4. Hypothyroidism Patient's great granddaughter reports she takes synthroid daily. -Hold synthroid due to NPO  5. Seizures Patient's great granddaughter reports she takes keppra daily. -Hold keppra due to NPO  6. GERD Patient's great granddaughter reports she takes pantoprazole daily. -Hold pantoprazole due to NPO  7. Urinary incontinence Patient has had episode of incontinence today in ED. Her great granddaughter reports she takes tolterodine daily. -Hold tolterodine due to NPO  8. Mycosis fungoides Patient's great granddaughter reports she takes clobetasol daily. -Continue clobetasol on affected areas.  9. Glaucoma Patient's great granddaughter reports she takes latanoprost daily. -Continue latanoprost.  Dispo: Admit patient to Observation with expected length of stay less than 2 midnights.  Signed: Mikal Plane, Medical Student 02/07/2021, 4:04 PM  Pager: [phone] After 5pm on weekdays and 1pm on weekends: On Call pager [phone]

## 2021-02-07 NOTE — Progress Notes (Signed)
Patient received to the unit. Patient is alert and oriented x3. Skin assessment done with another nurse .iv in place. Given instructions about call bell and phone. Bed in low position and call bell in reach.Marland Kitchen

## 2021-02-07 NOTE — Progress Notes (Signed)
Brief Neuro Update:  Reviewed MRI Brain and demonstrated an acute pontine stroke.  Plan: - Frequent Neuro checks per stroke unit protocol - Recommend brain imaging with MRI Brain without contrast - Recommend Vascular imaging with MRA Angio Head without contrast and US Carotid doppler - Recommend obtaining TTE - Recommend obtaining Lipid panel with LDL - Please start statin if LDL > 70 - Recommend HbA1c - Antithrombotic - aspirin 81mg  daily - Recommend DVT ppx - SBP goal - permissive hypertension first 24 h < 220/110. Held home meds.  - Recommend Telemetry monitoring for arrythmia - Recommend bedside swallow screen prior to PO intake. - Stroke education booklet - Recommend PT/OT/SLP consult  [place] Pager Number [phone]

## 2021-02-07 NOTE — ED Notes (Signed)
Pt soiled home brief with urine, pt changed and cleaned up. Pt placed on PureWick and educated on how to use.

## 2021-02-07 NOTE — ED Provider Notes (Signed)
[facility] EMERGENCY DEPARTMENT Provider Note   CSN: [id number] Arrival date & time: 02/07/21  0749     History Chief Complaint  Patient presents with  . Weakness    Olivia Sanders is a 87 y.o. female.  Patient here with weakness.  Per family member seems like to be more of a general weakness since a fall on Thursday.  Patient started tramadol as she broke her right wrist.  Not sure if it is medication related.  Sounds like she had may be some left-sided weakness but not really sure.  Possibly slurred speech.  Possibly facial droop.  Last known normal is may be Thursday.  Denies any infectious symptoms.  The history is provided by the patient.  Weakness Severity:  Mild Onset quality:  Gradual Timing:  Constant Chronicity:  New Relieved by:  Nothing Worsened by:  Nothing Associated symptoms: falls   Associated symptoms: no abdominal pain, no anorexia, no arthralgias, no chest pain, no cough, no dysuria, no fever, no seizures, no shortness of breath and no vomiting        Past Medical History:  Diagnosis Date  . Hypertension     There are no problems to display for this patient.      OB History   No obstetric history on file.     History reviewed. No pertinent family history.  Social History   Tobacco Use  . Smoking status: Never Smoker  . Smokeless tobacco: Never Used  Substance Use Topics  . Alcohol use: Never  . Drug use: Never    Home Medications Prior to Admission medications   Not on File    Allergies    Patient has no allergy information on record.  Review of Systems   Review of Systems  Constitutional: Negative for chills and fever.  HENT: Negative for ear pain and sore throat.   Eyes: Negative for pain and visual disturbance.  Respiratory: Negative for cough and shortness of breath.   Cardiovascular: Negative for chest pain and palpitations.  Gastrointestinal: Negative for abdominal pain, anorexia and vomiting.   Genitourinary: Negative for dysuria and hematuria.  Musculoskeletal: Positive for falls. Negative for arthralgias and back pain.  Skin: Negative for color change and rash.  Neurological: Positive for facial asymmetry, speech difficulty and weakness. Negative for seizures and syncope.  All other systems reviewed and are negative.   Physical Exam Updated Vital Signs  ED Triage Vitals [02/07/21 0752]  Enc Vitals Group     BP 138/71     Pulse Rate 96     Resp 13     Temp 98.6 F (37 C)     Temp Source Oral     SpO2 99 %     Weight      Height      Head Circumference      Peak Flow      Pain Score 0     Pain Loc      Pain Edu?      Excl. in [place]?     Physical Exam Vitals and nursing note reviewed.  Constitutional:      General: She is not in acute distress.    Appearance: She is well-developed. She is not ill-appearing.  HENT:     Head: Normocephalic and atraumatic.     Nose: Nose normal.     Mouth/Throat:     Mouth: Mucous membranes are moist.  Eyes:     Extraocular Movements: Extraocular movements intact.  Conjunctiva/sclera: Conjunctivae normal.     Pupils: Pupils are equal, round, and reactive to light.  Cardiovascular:     Rate and Rhythm: Normal rate and regular rhythm.     Pulses: Normal pulses.     Heart sounds: Normal heart sounds. No murmur heard.   Pulmonary:     Effort: Pulmonary effort is normal. No respiratory distress.     Breath sounds: Normal breath sounds.  Abdominal:     Palpations: Abdomen is soft.     Tenderness: There is no abdominal tenderness.  Musculoskeletal:     Cervical back: Normal range of motion and neck supple.  Skin:    General: Skin is warm and dry.  Neurological:     Mental Status: She is alert.     Comments: Appears to have some left-sided lower leg weakness 4+ out of 5 with no obvious drift, appears at 5+ out of 5 strength in right lower extremity with no drift, right arm is in a cast but strength appears to be intact,  left arm overall appears to be 5+ out of 5, normal sensation, no neglect, speech feels slightly slurred but no aphasia, she is slow with finger-to-nose finger     ED Results / Procedures / Treatments   Labs (all labs ordered are listed, but only abnormal results are displayed) Labs Reviewed  CBC - Abnormal; Notable for the following components:      Result Value   Hemoglobin 11.1 (*)    HCT 34.8 (*)    All other components within normal limits  COMPREHENSIVE METABOLIC PANEL - Abnormal; Notable for the following components:   Glucose, Bld 117 (*)    Creatinine, Ser 1.01 (*)    Albumin 3.3 (*)    GFR, Estimated 52 (*)    All other components within normal limits  I-STAT CHEM 8, ED - Abnormal; Notable for the following components:   Glucose, Bld 116 (*)    Calcium, Ion 1.11 (*)    Hemoglobin 10.9 (*)    HCT 32.0 (*)    All other components within normal limits  RESP PANEL BY RT-PCR (FLU A&B, COVID) ARPGX2  ETHANOL  PROTIME-INR  APTT  DIFFERENTIAL  RAPID URINE DRUG SCREEN, HOSP PERFORMED  URINALYSIS, ROUTINE W REFLEX MICROSCOPIC    EKG EKG Interpretation  Date/Time:  Saturday Feb 07 2021 07:50:08 EDT Ventricular Rate:  96 PR Interval:  151 QRS Duration: 89 QT Interval:  404 QTC Calculation: 511 R Axis:   11 Text Interpretation: Sinus rhythm Probable left atrial enlargement Probable anteroseptal infarct, old Borderline repolarization abnormality Prolonged QT interval Confirmed by Lennice Sites ([id number]) on 02/07/2021 8:16:06 AM   Radiology MR BRAIN WO CONTRAST  Result Date: 02/07/2021 CLINICAL DATA:  Stroke-like symptoms and left-sided weakness EXAM: MRI HEAD WITHOUT CONTRAST TECHNIQUE: Multiplanar, multiecho pulse sequences of the brain and surrounding structures were obtained without intravenous contrast. COMPARISON:  Head CT from earlier today FINDINGS: Brain: Wedge of acute infarction in the right pons. Generalized atrophy. Mild-to-moderate periventricular chronic small  vessel ischemia. No collection, hydrocephalus, mass, or acute hemorrhage Vascular: Preserved flow voids. Skull and upper cervical spine: Normal marrow signal. Advanced cervical spine degeneration with reversal of cervical lordosis and C3-4, C4-5 retrolisthesis narrowing the spinal canal. Sinuses/Orbits: Bilateral cataract resection. IMPRESSION: Acute right pontine infarct. Electronically Signed   By: Monte Fantasia M.D.   On: 02/07/2021 11:45   CT HEAD CODE STROKE WO CONTRAST  Result Date: 02/07/2021 CLINICAL DATA:  Code stroke. EXAM: CT HEAD WITHOUT  CONTRAST TECHNIQUE: Contiguous axial images were obtained from the base of the skull through the vertex without intravenous contrast. COMPARISON:  None. FINDINGS: Brain: No evidence of acute infarction, hemorrhage, hydrocephalus, extra-axial collection or mass lesion/mass effect. Generalized brain atrophy. Chronic small vessel ischemic type low-density in the deep cerebral white matter. Vascular: No hyperdense vessel or unexpected calcification. Skull: Negative for fracture or focal lesion. Subcutaneous reticulation superior to the right orbit which could be from contusion or scar. Sinuses/Orbits: No acute finding. Other: These results were communicated to Dr Lorrin Goodell at 8:54 amon 5/14/2022by text page via the [facility] messaging system. ASPECTS [facility] Stroke Program Early CT Score) Not scored without localizing symptoms IMPRESSION: 1. No acute finding. 2. Generalized atrophy and chronic small vessel ischemia. Electronically Signed   By: Monte Fantasia M.D.   On: 02/07/2021 08:56    Procedures .Critical Care Performed by: Lennice Sites, DO Authorized by: Lennice Sites, DO   Critical care provider statement:    Critical care time (minutes):  35   Critical care was necessary to treat or prevent imminent or life-threatening deterioration of the following conditions:  CNS failure or compromise   Critical care was time spent personally by me on the  following activities:  Blood draw for specimens, development of treatment plan with patient or surrogate, discussions with consultants, discussions with primary provider, evaluation of patient's response to treatment, obtaining history from patient or surrogate, ordering and performing treatments and interventions, ordering and review of laboratory studies, ordering and review of radiographic studies, pulse oximetry, re-evaluation of patient's condition and review of old charts   I assumed direction of critical care for this patient from another provider in my specialty: no       Medications Ordered in ED Medications - No data to display  ED Course  I have reviewed the triage vital signs and the nursing notes.  Pertinent labs & imaging results that were available during my care of the patient were reviewed by me and considered in my medical decision making (see chart for details).    MDM Rules/Calculators/A&P                          Neylan Hopsin is here with strokelike symptoms/weakness.  Normal vitals.  No fever.  Concern for LVO stroke upon my evaluation as patient tells me that last known normal was last night.  She appears to have some left lower leg weakness and slurred speech.  Possibly facial droop.  Code stroke was initiated given concern for LVO.  She is outside the window for tPA.  During this code stroke.  A family member arrived to thought that her last known normal would likely be Thursday.  Family concerned that possibly tramadol side effect causing her generalized weakness and slurred speech.  Head CT showed no acute findings.  Left lower leg weakness may be in the setting of having a left hip replacement.  She is not having any pain and suspect that left lower leg weakness likely due to lack of range of motion in the left hip.  Overall code stroke was canceled but will get an MRI to fully rule out stroke.  Will pursue infectious work-up including urine study.  Vital signs are  otherwise normal.  Suspect possibly medication side effect.  Code stroke eventually canceled after normal head CT and patient outside of the window for tPA and after more history obtained does not meet criteria for LVO/other acute stroke intervention.  However, patient had MRI that showed acute right pontine infarct.  Otherwise lab work is unremarkable.  Patient to be admitted for further stroke care.  This chart was dictated using voice recognition software.  Despite best efforts to proofread,  errors can occur which can change the documentation meaning.    Final Clinical Impression(s) / ED Diagnoses Final diagnoses:  Cerebrovascular accident (CVA), unspecified mechanism [facility])    Rx / DC Orders ED Discharge Orders    None       Lennice Sites, DO 02/07/21 1158

## 2021-02-07 NOTE — ED Triage Notes (Signed)
Pt brought to ED via EMS from home with c/o slurred speech, left sided facial droop, left sided weakness. All symptoms resolved PTA. EMS reports pt fell on Thursday and broke her right wrist, was treated in [place] for fall. Patient alert and oriented x 4 in ED, VSS, NAD. Left leg drift, neuro intact, pupils equal and reactive bilaterally.  EMS v/s: 136/78 137 cbg 98% on room air

## 2021-02-07 NOTE — ED Notes (Signed)
Pt encouraged to urinate and pt visitor also encouraging pt to urinate for sample.

## 2021-02-07 NOTE — ED Notes (Signed)
Patient transported to MRI 

## 2021-02-07 NOTE — ED Notes (Signed)
Patient to MRI at this time.

## 2021-02-08 ENCOUNTER — Inpatient Hospital Stay (HOSPITAL_COMMUNITY): Payer: Medicare Other

## 2021-02-08 ENCOUNTER — Observation Stay (HOSPITAL_BASED_OUTPATIENT_CLINIC_OR_DEPARTMENT_OTHER): Payer: Medicare Other

## 2021-02-08 ENCOUNTER — Observation Stay (HOSPITAL_COMMUNITY): Payer: Medicare Other

## 2021-02-08 DIAGNOSIS — Z79890 Hormone replacement therapy (postmenopausal): Secondary | ICD-10-CM | POA: Diagnosis not present

## 2021-02-08 DIAGNOSIS — R9431 Abnormal electrocardiogram [ECG] [EKG]: Secondary | ICD-10-CM | POA: Diagnosis present

## 2021-02-08 DIAGNOSIS — I6389 Other cerebral infarction: Secondary | ICD-10-CM | POA: Diagnosis not present

## 2021-02-08 DIAGNOSIS — I10 Essential (primary) hypertension: Secondary | ICD-10-CM | POA: Diagnosis not present

## 2021-02-08 DIAGNOSIS — I639 Cerebral infarction, unspecified: Secondary | ICD-10-CM

## 2021-02-08 DIAGNOSIS — H409 Unspecified glaucoma: Secondary | ICD-10-CM | POA: Diagnosis present

## 2021-02-08 DIAGNOSIS — W19XXXA Unspecified fall, initial encounter: Secondary | ICD-10-CM | POA: Diagnosis present

## 2021-02-08 DIAGNOSIS — S62101A Fracture of unspecified carpal bone, right wrist, initial encounter for closed fracture: Secondary | ICD-10-CM | POA: Diagnosis present

## 2021-02-08 DIAGNOSIS — N390 Urinary tract infection, site not specified: Secondary | ICD-10-CM | POA: Diagnosis present

## 2021-02-08 DIAGNOSIS — Z880 Allergy status to penicillin: Secondary | ICD-10-CM | POA: Diagnosis not present

## 2021-02-08 DIAGNOSIS — R1310 Dysphagia, unspecified: Secondary | ICD-10-CM | POA: Diagnosis present

## 2021-02-08 DIAGNOSIS — K5900 Constipation, unspecified: Secondary | ICD-10-CM | POA: Diagnosis present

## 2021-02-08 DIAGNOSIS — I6302 Cerebral infarction due to thrombosis of basilar artery: Secondary | ICD-10-CM

## 2021-02-08 DIAGNOSIS — E039 Hypothyroidism, unspecified: Secondary | ICD-10-CM | POA: Diagnosis not present

## 2021-02-08 DIAGNOSIS — Z823 Family history of stroke: Secondary | ICD-10-CM | POA: Diagnosis not present

## 2021-02-08 DIAGNOSIS — Z20822 Contact with and (suspected) exposure to covid-19: Secondary | ICD-10-CM | POA: Diagnosis present

## 2021-02-08 DIAGNOSIS — R29701 NIHSS score 1: Secondary | ICD-10-CM | POA: Diagnosis present

## 2021-02-08 DIAGNOSIS — R471 Dysarthria and anarthria: Secondary | ICD-10-CM | POA: Diagnosis present

## 2021-02-08 DIAGNOSIS — K219 Gastro-esophageal reflux disease without esophagitis: Secondary | ICD-10-CM | POA: Diagnosis present

## 2021-02-08 DIAGNOSIS — R4781 Slurred speech: Secondary | ICD-10-CM | POA: Diagnosis present

## 2021-02-08 DIAGNOSIS — C8400 Mycosis fungoides, unspecified site: Secondary | ICD-10-CM | POA: Diagnosis present

## 2021-02-08 DIAGNOSIS — I69354 Hemiplegia and hemiparesis following cerebral infarction affecting left non-dominant side: Secondary | ICD-10-CM | POA: Diagnosis not present

## 2021-02-08 DIAGNOSIS — G40909 Epilepsy, unspecified, not intractable, without status epilepticus: Secondary | ICD-10-CM | POA: Diagnosis present

## 2021-02-08 DIAGNOSIS — Z8249 Family history of ischemic heart disease and other diseases of the circulatory system: Secondary | ICD-10-CM | POA: Diagnosis not present

## 2021-02-08 DIAGNOSIS — I6329 Cerebral infarction due to unspecified occlusion or stenosis of other precerebral arteries: Secondary | ICD-10-CM | POA: Diagnosis present

## 2021-02-08 DIAGNOSIS — E785 Hyperlipidemia, unspecified: Secondary | ICD-10-CM | POA: Diagnosis present

## 2021-02-08 DIAGNOSIS — I69392 Facial weakness following cerebral infarction: Secondary | ICD-10-CM | POA: Diagnosis not present

## 2021-02-08 DIAGNOSIS — R32 Unspecified urinary incontinence: Secondary | ICD-10-CM | POA: Diagnosis present

## 2021-02-08 DIAGNOSIS — R569 Unspecified convulsions: Secondary | ICD-10-CM | POA: Diagnosis not present

## 2021-02-08 LAB — BASIC METABOLIC PANEL
Anion gap: 8 (ref 5–15)
BUN: 15 mg/dL (ref 8–23)
CO2: 28 mmol/L (ref 22–32)
Calcium: 9.3 mg/dL (ref 8.9–10.3)
Chloride: 103 mmol/L (ref 98–111)
Creatinine, Ser: 1.01 mg/dL — ABNORMAL HIGH (ref 0.44–1.00)
GFR, Estimated: 52 mL/min — ABNORMAL LOW (ref >60)
Glucose, Bld: 102 mg/dL — ABNORMAL HIGH (ref 70–99)
Potassium: 3.7 mmol/L (ref 3.5–5.1)
Sodium: 139 mmol/L (ref 135–145)

## 2021-02-08 LAB — CBC
HCT: 33.6 % — ABNORMAL LOW (ref 36.0–46.0)
Hemoglobin: 10.8 g/dL — ABNORMAL LOW (ref 12.0–15.0)
MCH: 26.4 pg (ref 26.0–34.0)
MCHC: 32.1 g/dL (ref 30.0–36.0)
MCV: 82.2 fL (ref 80.0–100.0)
Platelets: 267 10*3/uL (ref 150–400)
RBC: 4.09 MIL/uL (ref 3.87–5.11)
RDW: 13.6 % (ref 11.5–15.5)
WBC: 5.1 10*3/uL (ref 4.0–10.5)
nRBC: 0 % (ref 0.0–0.2)

## 2021-02-08 LAB — ECHOCARDIOGRAM COMPLETE BUBBLE STUDY
AR max vel: 1.53 cm2
AV Area VTI: 1.81 cm2
AV Area mean vel: 1.57 cm2
AV Mean grad: 12 mmHg
AV Peak grad: 22.3 mmHg
Ao pk vel: 2.36 m/s
Area-P 1/2: 4.44 cm2
S' Lateral: 2.9 cm

## 2021-02-08 IMAGING — MR MR HEAD W/O CM
8 of 9 series · 39 of 48 positions shown · non-contrast
Comparison: Noncontrast head CT [DATE]. MRI/MRA head
[DATE].

CLINICAL DATA: Stroke, follow-up.

EXAM:
MRI HEAD WITHOUT CONTRAST
TECHNIQUE: Multiplanar, multiecho pulse sequences of the brain and surrounding
structures were obtained without intravenous contrast.

[Series 5: DWI · axial · 3.0mm · 0.88mm/px · z∈[-94,+46]mm · 8 of 96 slices shown (1 of 4)]
[im 1/96]
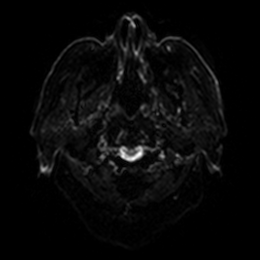
[im 14/96]
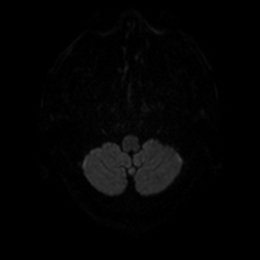
[im 28/96]
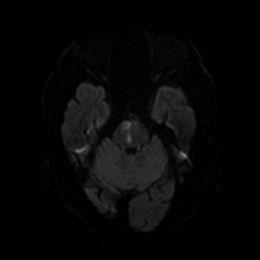
[im 41/96]
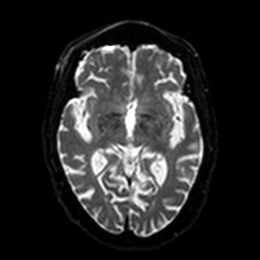
[im 55/96]
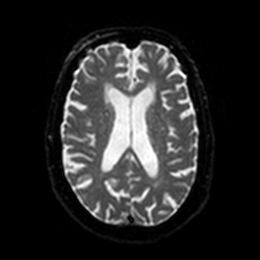
[im 68/96]
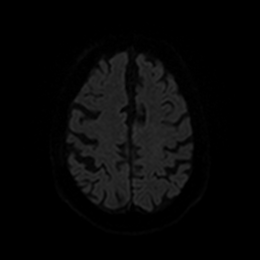
[im 82/96]
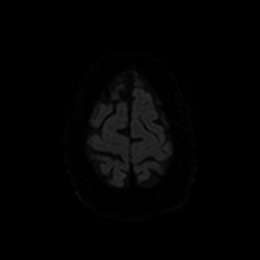
[im 96/96]
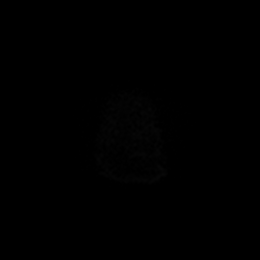

[Series 6: DWI · axial · 3.0mm · 0.88mm/px · z∈[-94,+46]mm · 4 of 48 slices shown (2 of 4)]
[im 1/48]
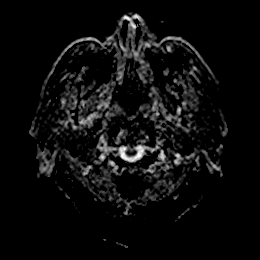
[im 16/48]
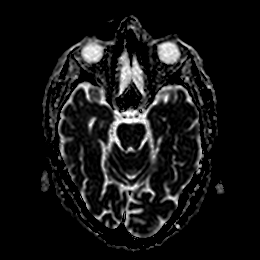
[im 32/48]
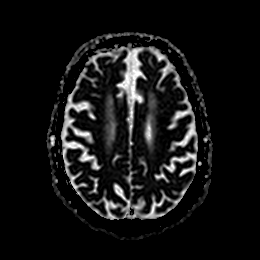
[im 48/48]
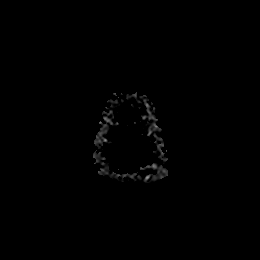

[Series 7: DWI · coronal · 4.0mm · 0.88mm/px · 5 of 68 slices shown (3 of 4)]
[im 1/68]
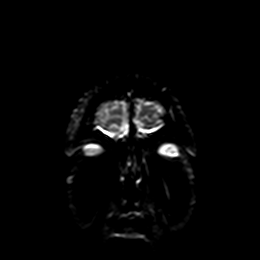
[im 17/68]
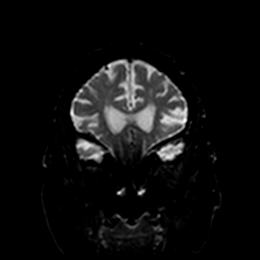
[im 34/68]
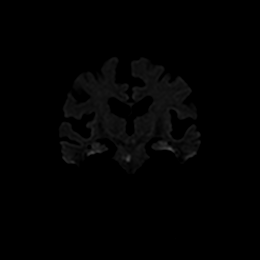
[im 51/68]
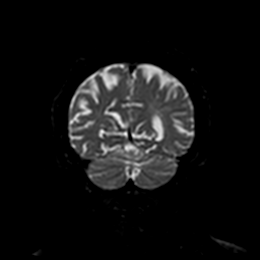
[im 68/68]
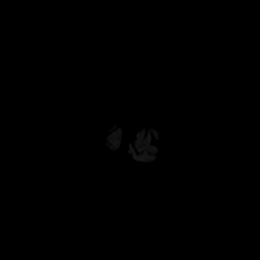

[Series 8: DWI · coronal · 4.0mm · 0.88mm/px · 3 of 34 slices shown (4 of 4)]
[im 1/34]
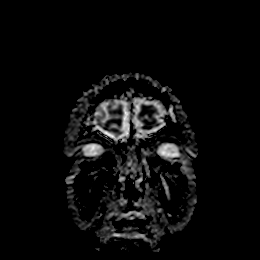
[im 17/34]
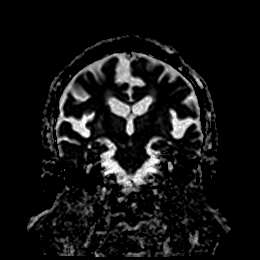
[im 34/34]
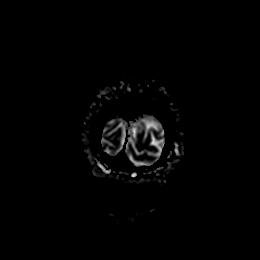

[Series 9: mag_images · axial · 3.0mm · 0.90mm/px · z∈[-120,+56]mm · 5 of 60 slices shown]
[im 1/60]
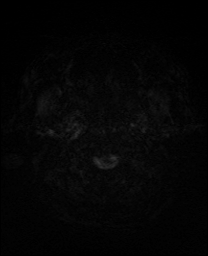
[im 15/60]
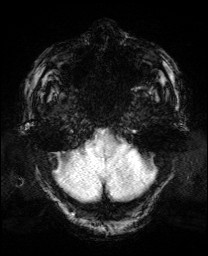
[im 30/60]
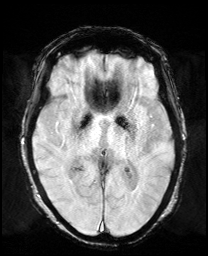
[im 45/60]
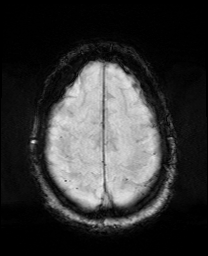
[im 60/60]
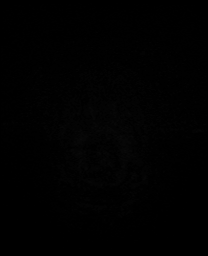

[Series 10: pha_images · axial · 3.0mm · 0.90mm/px · z∈[-117,+56]mm · 5 of 59 slices shown]
[im 1/59]
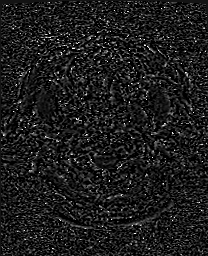
[im 15/59]
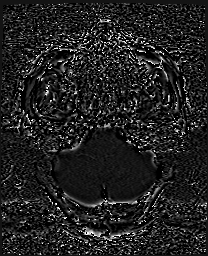
[im 30/59]
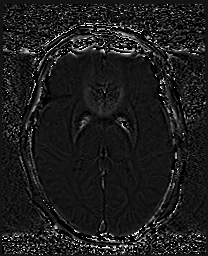
[im 44/59]
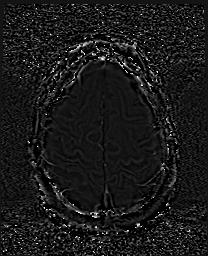
[im 59/59]
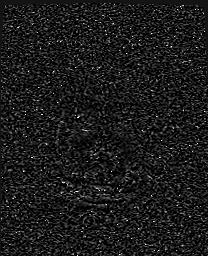

[Series 11: swi_images · axial · 3.0mm · 0.90mm/px · z∈[-120,+56]mm · 5 of 60 slices shown]
[im 1/60]
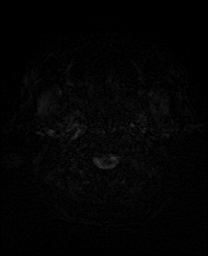
[im 15/60]
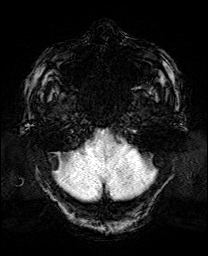
[im 30/60]
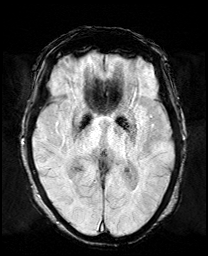
[im 45/60]
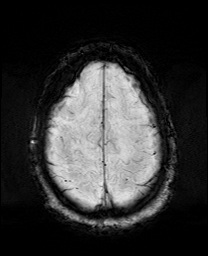
[im 60/60]
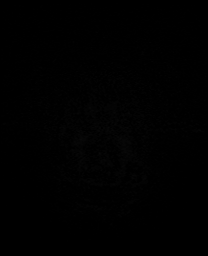

[Series 12: mip_images(sw) · axial · 24.0mm · 0.90mm/px · z∈[-110,+45]mm · 4 of 53 slices shown]
[im 1/53]
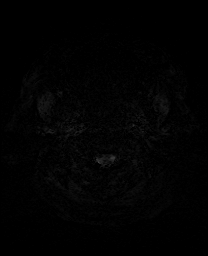
[im 18/53]
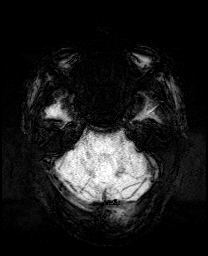
[im 35/53]
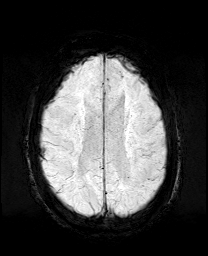
[im 53/53]
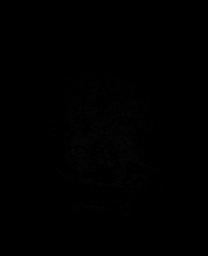

[39 of 48 positions shown; findings below may reference images not displayed]

FINDINGS: A limited protocol brain MRI was performed at the ordering
provider's request. Only axial and coronal diffusion-weighted
sequences and an axial SWI sequence were acquired.

An acute infarct within the right pons has not significantly changed
in size as compared to the brain MRI of [DATE], again measuring
1.7 x 0.9 x 1.1 cm. No evidence of hemorrhagic conversion.

No interval acute infarct is identified elsewhere.
IMPRESSION: Limited protocol brain MRI consisting of only diffusion-weighted and
SWI sequences.

An acute infarct within the right pons is not significantly changed
in size as compared to the brain MRI of [DATE], again measuring
1.7 x 0.9 x 1.1 cm. No evidence of hemorrhagic conversion.

## 2021-02-08 MED ORDER — PANTOPRAZOLE SODIUM 40 MG PO TBEC
40.0000 mg | DELAYED_RELEASE_TABLET | Freq: Every day | ORAL | Status: DC
  Administered 2021-02-08 – 2021-02-11 (×4): 40 mg via ORAL
  Filled 2021-02-08 (×4): qty 1

## 2021-02-08 MED ORDER — LEVOTHYROXINE SODIUM 25 MCG PO TABS
25.0000 ug | ORAL_TABLET | Freq: Every day | ORAL | Status: DC
  Administered 2021-02-08 – 2021-02-11 (×3): 25 ug via ORAL
  Filled 2021-02-08 (×4): qty 1

## 2021-02-08 NOTE — Progress Notes (Addendum)
HD#1 Subjective:  Overnight Events: None  Patient this morning examined at bedside. Notes her left arm is more weak. States this started late last night/early this morning. States she did not talk to anyone about this. Patient expresses she would like to go home and that she is sad that she missed her grandaughter's graduation.   Objective:  Vital signs in last 24 hours: Vitals:   02/07/21 1726 02/07/21 2022 02/08/21 0024 02/08/21 0433  BP: (!) 154/65 (!) 159/70 (!) 153/69 (!) 148/75  Pulse: 93 99 92 94  Resp: 16 18 18 18   Temp: 98.7 F (37.1 C) 99.7 F (37.6 C) 99.4 F (37.4 C) 99.4 F (37.4 C)  TempSrc: Oral Oral Oral Oral  SpO2: 100% 95% 97% 99%   Supplemental O2: Room Air SpO2: 99 %   Physical Exam:  Physical Exam Constitutional:      Appearance: Normal appearance.  HENT:     Head: Normocephalic and atraumatic.  Cardiovascular:     Rate and Rhythm: Normal rate and regular rhythm.     Comments: Systolic murmur of best her on the right sternal border Pulmonary:     Effort: Pulmonary effort is normal.     Breath sounds: Normal breath sounds. No wheezing or rhonchi.  Abdominal:     General: Abdomen is flat. Bowel sounds are normal.     Palpations: Abdomen is soft.  Neurological:     Mental Status: She is alert.     Comments: 0-1/5 strength of the LUE, RUE strength limited by pain, left facial droop persistent from yesterday, intermittent slurred speech during exam    There were no vitals filed for this visit.   Intake/Output Summary (Last 24 hours) at 02/08/2021 0615 Last data filed at 02/08/2021 0434 Gross per 24 hour  Intake 120 ml  Output 400 ml  Net -280 ml   Net IO Since Admission: -280 mL [02/08/21 0615]  Pertinent Labs: CBC Latest Ref Rng & Units 02/08/2021 02/07/2021 02/07/2021  WBC 4.0 - 10.5 K/uL 5.1 - 4.9  Hemoglobin 12.0 - 15.0 g/dL 10.8(L) 10.9(L) 11.1(L)  Hematocrit 36.0 - 46.0 % 33.6(L) 32.0(L) 34.8(L)  Platelets 150 - 400 K/uL 267 - 263     CMP Latest Ref Rng & Units 02/08/2021 02/07/2021 02/07/2021  Glucose 70 - 99 mg/dL 102(H) 116(H) 117(H)  BUN 8 - 23 mg/dL 15 20 15   Creatinine 0.44 - 1.00 mg/dL 1.01(H) 1.00 1.01(H)  Sodium 135 - 145 mmol/L 139 140 138  Potassium 3.5 - 5.1 mmol/L 3.7 3.9 3.9  Chloride 98 - 111 mmol/L 103 102 101  CO2 22 - 32 mmol/L 28 - 27  Calcium 8.9 - 10.3 mg/dL 9.3 - 9.0  Total Protein 6.5 - 8.1 g/dL - - 7.0  Total Bilirubin 0.3 - 1.2 mg/dL - - 0.7  Alkaline Phos 38 - 126 U/L - - 62  AST 15 - 41 U/L - - 30  ALT 0 - 44 U/L - - 20   A1c of 6.2  Ref Range & Units 1 d ago  Cholesterol 0 - 200 mg/dL 202High   Triglycerides <150 mg/dL 105   HDL >40 mg/dL 43   Total CHOL/HDL Ratio RATIO 4.7   VLDL 0 - 40 mg/dL 21   LDL Cholesterol 0 - 99 mg/dL 138High     Imaging: MR ANGIO HEAD WO CONTRAST  IMPRESSION:  1. Intracranial atherosclerotic disease, as outlined and with findings most notably as follows.  2. Partially occluded left posterior communicating  artery.  3. Sites of up to moderate stenosis within the V4 left vertebral artery.  4. Moderate stenosis within the right PCA at the P1/P2 junction.  5. Moderate stenosis within the P3 right posterior cerebral artery.  6. Mild/moderate stenoses within the P2 and P3 segments of the left posterior cerebral artery.  7. Sites of moderate/severe stenosis within the A2/A3 left anterior cerebral artery.  8. 2 mm medially projecting vascular protrusion arising from the cavernous right ICA, likely reflecting an aneurysm.  9. 1-2 mm inferiorly projecting vascular protrusion arising from the distal cavernous/paraclinoid right ICA, which could reflect atherosclerotic irregularity or a small aneurysm.  10. 2 mm inferiorly projecting vascular protrusion arising from the paraclinoid left ICA, which may reflect an aneurysm or the origin of a partially occluded left posterior communicating artery. Electronically Signed   By: Kellie Simmering DO   On: 02/07/2021 16:25    MR BRAIN WO CONTRAST  Result Date: 02/07/2021 CLINICAL DATA:  Stroke-like symptoms and left-sided weakness EXAM: MRI HEAD WITHOUT CONTRAST TECHNIQUE: Multiplanar, multiecho pulse sequences of the brain and surrounding structures were obtained without intravenous contrast. COMPARISON:  Head CT from earlier today FINDINGS: Brain: Wedge of acute infarction in the right pons. Generalized atrophy. Mild-to-moderate periventricular chronic small vessel ischemia. No collection, hydrocephalus, mass, or acute hemorrhage Vascular: Preserved flow voids. Skull and upper cervical spine: Normal marrow signal. Advanced cervical spine degeneration with reversal of cervical lordosis and C3-4, C4-5 retrolisthesis narrowing the spinal canal. Sinuses/Orbits: Bilateral cataract resection. IMPRESSION: Acute right pontine infarct. Electronically Signed   By: Monte Fantasia M.D.   On: 02/07/2021 11:45   CT HEAD CODE STROKE WO CONTRAST  Result Date: 02/07/2021 CLINICAL DATA:  Code stroke. EXAM: CT HEAD WITHOUT CONTRAST TECHNIQUE: Contiguous axial images were obtained from the base of the skull through the vertex without intravenous contrast. COMPARISON:  None. FINDINGS: Brain: No evidence of acute infarction, hemorrhage, hydrocephalus, extra-axial collection or mass lesion/mass effect. Generalized brain atrophy. Chronic small vessel ischemic type low-density in the deep cerebral white matter. Vascular: No hyperdense vessel or unexpected calcification. Skull: Negative for fracture or focal lesion. Subcutaneous reticulation superior to the right orbit which could be from contusion or scar. Sinuses/Orbits: No acute finding. Other: These results were communicated to Dr Lorrin Goodell at 8:54 amon 5/14/2022by text page via the [facility] messaging system. ASPECTS [facility] Stroke Program Early CT Score) Not scored without localizing symptoms IMPRESSION: 1. No acute finding. 2. Generalized atrophy and chronic small vessel ischemia. Electronically  Signed   By: Monte Fantasia M.D.   On: 02/07/2021 08:56    Assessment/Plan:   Active Problems:   Stroke [facility])   Patient Summary: Acute right pontine infarct Patient notes new LUE weakness this morning/late last night. Continues to feel weak. On exam LUE strength markedly diminished. MRA with multiple areas of atherosclerotic disease with stenosis of L posterior communication, left vertebral artery, right PCA, Left PCA, and L ACA. Caroid [place] without significant stenosis of internal carotid arteries. Echo with EF of 17-61%, grade I diastolic dysfunction.  Mild aortic valve stenosis, negative for shunt. Discussed new LUE weakness with neurology. - Neurology consult, appreciate recommendations  - Follow up MRI -Continue atorvastatin 40 mg daily -Continue ASA 81 mg daily and plavix 75 mg daily - Continue to hold BP medications allow for permissive hypertension, goal <220/110 -Monitor on tele -PT/OT  Hypertension Continue to allow for permissive hypertension. BP in setting of evolving symptoms -Hold home amlodipine, furosemide, clonidine, and losartan due to BP  and NPO status  QT prolongation Noted to have QT prolongation on EKG on admission.  -Avoid QT prolonging medications  Hypothyroidism -Restart synthroid 25 mcg  Seizures Patient and family reports history of seizure. Patient alert and oriented, no seizure like activity.  -Continue keppra  500 mg twice daily  GERD - resume home pantoprazole  Diet: Heart Healthy IVF: None,None VTE: Enoxaparin Code: Full PT/OT recs: Pending TOC recs: Pending   Dispo: Anticipated discharge to Pending in 2 days pending further workup.   Iona Beard, MD 02/08/2021, 6:15 AM Pager: [phone]  Please contact the on call pager after 5 pm and on weekends at [phone].

## 2021-02-08 NOTE — Progress Notes (Addendum)
PT Cancellation Note  Patient Details Name: Olivia Sanders MRN: [id number] DOB: 10-06-1923   Cancelled Treatment:    Reason Eval/Treat Not Completed: Patient at procedure or test/unavailable   Will follow up later today as time allows;  Otherwise, will follow up for PT tomorrow;   Thank you,  Roney Marion, [place] Pager [phone] Office [phone]  Addendum: Noted she has a recent R wrist fx; Will proceed NWB through R forearm/wrist unless otherwise ordered   Colletta [place] 02/08/2021, 9:22 AM

## 2021-02-08 NOTE — Progress Notes (Addendum)
STROKE TEAM PROGRESS NOTE   INTERVAL HISTORY Her two daughters and granddaughter  is at the bedside.   They had numerous questions regarding plan of care. All questions were answered to their satisfaction.   Vitals:   02/08/21 0433 02/08/21 0806 02/08/21 1219 02/08/21 1224  BP: (!) 148/75 (!) 144/71 (!) 176/68 (!) 159/63  Pulse: 94 100 96   Resp: 18 17 18    Temp: 99.4 F (37.4 C) 99.8 F (37.7 C) 99.8 F (37.7 C)   TempSrc: Oral Oral Oral   SpO2: 99% 96% 97%    CBC:  Recent Labs  Lab 02/07/21 0831 02/07/21 0847 02/08/21 0320  WBC 4.9  --  5.1  NEUTROABS 3.5  --   --   HGB 11.1* 10.9* 10.8*  HCT 34.8* 32.0* 33.6*  MCV 82.5  --  82.2  PLT 263  --  99991111   Basic Metabolic Panel:  Recent Labs  Lab 02/07/21 0831 02/07/21 0847 02/08/21 0320  NA 138 140 139  K 3.9 3.9 3.7  CL 101 102 103  CO2 27  --  28  GLUCOSE 117* 116* 102*  BUN 15 20 15   CREATININE 1.01* 1.00 1.01*  CALCIUM 9.0  --  9.3    Lipid Panel:  Recent Labs  Lab 02/07/21 1359  CHOL 202*  TRIG 105  HDL 43  CHOLHDL 4.7  VLDL 21  LDLCALC 138*    HgbA1c:  Recent Labs  Lab 02/07/21 1359  HGBA1C 6.2*   Urine Drug Screen:  Recent Labs  Lab 02/07/21 1300  LABOPIA NONE DETECTED  COCAINSCRNUR NONE DETECTED  LABBENZ NONE DETECTED  AMPHETMU NONE DETECTED  THCU NONE DETECTED  LABBARB NONE DETECTED    Alcohol Level  Recent Labs  Lab 02/07/21 0831  ETH <10    IMAGING past 24 hours MR ANGIO HEAD WO CONTRAST  Result Date: 02/07/2021 CLINICAL DATA:  Stroke, follow-up. EXAM: MRA HEAD WITHOUT CONTRAST TECHNIQUE: Angiographic images of the Circle of Willis were acquired using MRA technique without intravenous contrast. COMPARISON:  Noncontrast brain MRI performed earlier today 02/07/2021. Noncontrast head CT performed earlier today 02/07/2021 FINDINGS: Anterior circulation: The intracranial internal carotid arteries are patent. Mild atherosclerotic irregularity of both vessels without hemodynamically  significant stenosis. The M1 middle cerebral arteries are patent. No M2 proximal branch occlusion. The anterior cerebral arteries are patent. Sites of moderate/severe stenosis within the A2/A3 left anterior cerebral artery. 2 mm medially projecting vascular protrusion arising from the cavernous right ICA likely reflecting an aneurysm (series 2, image 65). 1-2 mm inferiorly projecting vascular protrusion arising from the distal cavernous/paraclinoid right ICA, which could reflect atherosclerotic irregularity or a small aneurysm (series 2, image 66) (series 257, image 173). 2 mm inferiorly projecting vascular protrusion arising from the paraclinoid left ICA, which may reflect an aneurysm or the origin of a partially occluded left posterior communicating artery (series 2, image 74) series 256, image 200). Posterior circulation: The intracranial vertebral arteries are patent. Atherosclerotic irregularity of both vessels with up to mild stenosis of the intracranial right vertebral artery, and sites of up to moderate stenosis of the intracranial left vertebral artery. The basilar artery is patent. Mild atherosclerotic irregularity of the basilar artery. The posterior cerebral arteries are patent. Fetal origin right posterior cerebral artery. Moderate stenosis within the right PCA at the P1/P2 junction. Moderate stenosis within the P3 right posterior cerebral artery. There is also atherosclerotic irregularity of the left posterior cerebral artery. Most notably, mild/moderate stenoses within the P2 and P3  segments. A left posterior communicating artery is present but appears partially occluded. Anatomic variants: As described IMPRESSION: 1. Intracranial atherosclerotic disease, as outlined and with findings most notably as follows. 2. Partially occluded left posterior communicating artery. 3. Sites of up to moderate stenosis within the V4 left vertebral artery. 4. Moderate stenosis within the right PCA at the P1/P2  junction. 5. Moderate stenosis within the P3 right posterior cerebral artery. 6. Mild/moderate stenoses within the P2 and P3 segments of the left posterior cerebral artery. 7. Sites of moderate/severe stenosis within the A2/A3 left anterior cerebral artery. 8. 2 mm medially projecting vascular protrusion arising from the cavernous right ICA, likely reflecting an aneurysm. 9. 1-2 mm inferiorly projecting vascular protrusion arising from the distal cavernous/paraclinoid right ICA, which could reflect atherosclerotic irregularity or a small aneurysm. 10. 2 mm inferiorly projecting vascular protrusion arising from the paraclinoid left ICA, which may reflect an aneurysm or the origin of a partially occluded left posterior communicating artery. Electronically Signed   By: Kellie Simmering DO   On: 02/07/2021 16:25   ECHOCARDIOGRAM COMPLETE BUBBLE STUDY  Result Date: 02/08/2021    ECHOCARDIOGRAM REPORT   Patient Name:   LORELEE HOPSIN Date of Exam: 02/08/2021 Medical Rec #:  [id number]      Height: Accession #:    [id number]     Weight: Date of Birth:  [birth year]      BSA: Patient Age:    87 years       BP:           144/71 mmHg Patient Gender: F              HR:           100 bpm. Exam Location:  Inpatient Procedure: 2D Echo, Cardiac Doppler, Color Doppler and Saline Contrast Bubble            Study Indications:    Stroke 434.91 / I63.9  History:        Patient has no prior history of Echocardiogram examinations.  Sonographer:    Merrie Roof Referring Phys: [id number] ADAM CURATOLO IMPRESSIONS  1. Left ventricular ejection fraction, by estimation, is 65 to 70%. The left ventricle has normal function. The left ventricle has no regional wall motion abnormalities. There is moderate concentric left ventricular hypertrophy. Left ventricular diastolic parameters are consistent with Grade I diastolic dysfunction (impaired relaxation). Elevated left ventricular end-diastolic pressure.  2. Right ventricular systolic function is normal.  The right ventricular size is normal. There is normal pulmonary artery systolic pressure. The estimated right ventricular systolic pressure is 123XX123 mmHg.  3. The mitral valve is normal in structure. Trivial mitral valve regurgitation. No evidence of mitral stenosis.  4. The non coronary cusp is severely calcified and thickened and immobile. The aortic valve is tricuspid. Aortic valve regurgitation is not visualized. Mild aortic valve stenosis. Aortic valve area, by VTI measures 1.81 cm. Aortic valve mean gradient measures 12.0 mmHg. Aortic valve Vmax measures 2.36 m/s.  5. The inferior vena cava is normal in size with greater than 50% respiratory variability, suggesting right atrial pressure of 3 mmHg.  6. Agitated saline contrast bubble study was negative, with no evidence of any interatrial shunt. FINDINGS  Left Ventricle: Left ventricular ejection fraction, by estimation, is 65 to 70%. The left ventricle has normal function. The left ventricle has no regional wall motion abnormalities. The left ventricular internal cavity size was normal in size. There is  moderate concentric left ventricular hypertrophy. Left ventricular  diastolic parameters are consistent with Grade I diastolic dysfunction (impaired relaxation). Elevated left ventricular end-diastolic pressure. Right Ventricle: The right ventricular size is normal. No increase in right ventricular wall thickness. Right ventricular systolic function is normal. There is normal pulmonary artery systolic pressure. The tricuspid regurgitant velocity is 2.61 m/s, and  with an assumed right atrial pressure of 3 mmHg, the estimated right ventricular systolic pressure is 123XX123 mmHg. Left Atrium: Left atrial size was normal in size. Right Atrium: Right atrial size was normal in size. Pericardium: There is no evidence of pericardial effusion. Mitral Valve: The mitral valve is normal in structure. Trivial mitral valve regurgitation. No evidence of mitral valve stenosis.  Tricuspid Valve: The tricuspid valve is normal in structure. Tricuspid valve regurgitation is not demonstrated. No evidence of tricuspid stenosis. Aortic Valve: The non coronary cusp is severely calcified and thickened and immobile. The aortic valve is tricuspid. Aortic valve regurgitation is not visualized. Mild aortic stenosis is present. Aortic valve mean gradient measures 12.0 mmHg. Aortic valve peak gradient measures 22.3 mmHg. Aortic valve area, by VTI measures 1.81 cm. Pulmonic Valve: The pulmonic valve was normal in structure. Pulmonic valve regurgitation is not visualized. No evidence of pulmonic stenosis. Aorta: The aortic root is normal in size and structure. Venous: The inferior vena cava is normal in size with greater than 50% respiratory variability, suggesting right atrial pressure of 3 mmHg. IAS/Shunts: No atrial level shunt detected by color flow Doppler. Agitated saline contrast was given intravenously to evaluate for intracardiac shunting. Agitated saline contrast bubble study was negative, with no evidence of any interatrial shunt.  LEFT VENTRICLE PLAX 2D LVIDd:         3.90 cm  Diastology LVIDs:         2.90 cm  LV e' medial:    6.31 cm/s LV PW:         1.30 cm  LV E/e' medial:  12.3 LV IVS:        1.30 cm  LV e' lateral:   5.44 cm/s LVOT diam:     2.00 cm  LV E/e' lateral: 14.3 LV SV:         74 LVOT Area:     3.14 cm  RIGHT VENTRICLE RV Basal diam:  3.00 cm LEFT ATRIUM             RIGHT ATRIUM LA diam:        3.20 cm RA Area:     11.30 cm LA Vol (A2C):   39.2 ml RA Volume:   22.30 ml LA Vol (A4C):   33.7 ml LA Biplane Vol: 37.2 ml  AORTIC VALVE AV Area (Vmax):    1.53 cm AV Area (Vmean):   1.57 cm AV Area (VTI):     1.81 cm AV Vmax:           236.00 cm/s AV Vmean:          168.000 cm/s AV VTI:            0.412 m AV Peak Grad:      22.3 mmHg AV Mean Grad:      12.0 mmHg LVOT Vmax:         115.00 cm/s LVOT Vmean:        83.900 cm/s LVOT VTI:          0.237 m LVOT/AV VTI ratio: 0.58  AORTA  Ao Root diam: 2.70 cm Ao Asc diam:  2.60 cm MITRAL VALVE  TRICUSPID VALVE MV Area (PHT): 4.44 cm     TR Peak grad:   27.2 mmHg MV Decel Time: 171 msec     TR Vmax:        261.00 cm/s MV E velocity: 77.70 cm/s MV A velocity: 111.00 cm/s  SHUNTS MV E/A ratio:  0.70         Systemic VTI:  0.24 m                             Systemic Diam: 2.00 cm Fransico Him MD Electronically signed by Fransico Him MD Signature Date/Time: 02/08/2021/12:08:03 PM    Final    VAS US CAROTID  Result Date: 02/08/2021 Carotid Arterial Duplex Study Patient Name:  SAMYAH BREELAND  Date of Exam:   02/08/2021 Medical Rec #: [id number]       Accession #:    [id number] Date of Birth: 10/27/1923       Patient Gender: F Patient Age:   7Y Exam Location:  [facility] Procedure:      VAS US CAROTID Referring Phys: [id number] Velna Ochs --------------------------------------------------------------------------------  Indications:       CVA. Risk Factors:      Hypertension, no history of smoking. Comparison Study:  No previous exams Performing Technologist: Rogelia Rohrer  Examination Guidelines: A complete evaluation includes B-mode imaging, spectral Doppler, color Doppler, and power Doppler as needed of all accessible portions of each vessel. Bilateral testing is considered an integral part of a complete examination. Limited examinations for reoccurring indications may be performed as noted.  Right Carotid Findings: +----------+-------+--------+--------+-----------------------+-----------------+           PSV    EDV cm/sStenosisPlaque Description     Comments                    cm/s                                                            +----------+-------+--------+--------+-----------------------+-----------------+ CCA Prox  104    16                                                       +----------+-------+--------+--------+-----------------------+-----------------+ CCA Distal97     14               heterogenous           intimal                                                                   thickening        +----------+-------+--------+--------+-----------------------+-----------------+ ICA Prox  123    23      1-39%   heterogenous and       tortuous  calcific                                 +----------+-------+--------+--------+-----------------------+-----------------+ ICA Distal104    28                                     tortuous          +----------+-------+--------+--------+-----------------------+-----------------+ ECA       237    11      >50%    calcific and irregular                   +----------+-------+--------+--------+-----------------------+-----------------+ +----------+--------+-------+----------------+-------------------+           PSV cm/sEDV cmsDescribe        Arm Pressure (mmHG) +----------+--------+-------+----------------+-------------------+ [id number]            Multiphasic, WNL                    +----------+--------+-------+----------------+-------------------+ +---------+--------+--+--------+--+---------+ VertebralPSV cm/s49EDV cm/s11Antegrade +---------+--------+--+--------+--+---------+  Left Carotid Findings: +----------+--------+--------+--------+------------------+------------------+           PSV cm/sEDV cm/sStenosisPlaque DescriptionComments           +----------+--------+--------+--------+------------------+------------------+ CCA Prox  133     18                                intimal thickening +----------+--------+--------+--------+------------------+------------------+ CCA Distal192     27      <50%    hypoechoic                           +----------+--------+--------+--------+------------------+------------------+ ICA Prox  104     27                                                    +----------+--------+--------+--------+------------------+------------------+ ICA Distal94      24                                                   +----------+--------+--------+--------+------------------+------------------+ ECA       100     0                                                    +----------+--------+--------+--------+------------------+------------------+ +----------+--------+--------+----------------+-------------------+           PSV cm/sEDV cm/sDescribe        Arm Pressure (mmHG) +----------+--------+--------+----------------+-------------------+ Subclavian129             Multiphasic, WNL                    +----------+--------+--------+----------------+-------------------+ +---------+--------+--+--------+--+---------+ VertebralPSV cm/s57EDV cm/s15Antegrade +---------+--------+--+--------+--+---------+   Summary: Right Carotid: Velocities in the right ICA are consistent with a 1-39% stenosis.                Non-hemodynamically significant plaque <50% noted in the CCA.  The                ECA appears >50% stenosed. Left Carotid: There is no evidence of stenosis in the left ICA. Near 50%               stenosis noted in the distal CCA. The ECA appears <50% stenosed. Vertebrals:  Bilateral vertebral arteries demonstrate antegrade flow. Subclavians: Normal flow hemodynamics were seen in bilateral subclavian              arteries. *See table(s) above for measurements and observations.  Electronically signed by Servando Snare MD on 02/08/2021 at 10:45:47 AM.    Final     PHYSICAL EXAM  Mental status/Cognition: Alert, oriented to self, place, month and year, good attention. Able to name all daughters at bedside Speech/language: Mildly dysarthric(likely from missing teeth)Fluent, comprehension intact, object naming intact, repetition intact. Cranial nerves:   CN II Pupils equal and reactive to light, no VF deficits   CN III,IV,VI EOM intact, no gaze preference or  deviation, no nystagmus   CN V normal sensation in V1, V2, and V3 segments bilaterally   CN VII no asymmetry, no nasolabial fold flattening   CN VIII normal hearing to speech   CN IX & X normal palatal elevation, no uvular deviation   CN XI 5/5 head turn and 5/5 shoulder shrug bilaterally   CN XII midline tongue protrusion     ASSESSMENT/PLAN Ms. Vernecia Umble is a 87 y.o. 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    Initial NIHSS 1 for left leg drift. She was not given TPA due to unclear LKW, Low NIHSS.    She was admitted for acute CVA work-up and frequent neurochecks.  Started on Lipitor, aspirin, and Plavix.   Chart review shows that patient developed new left upper extremity weakness but did not inform nursing staff. MRI brain is being obtained at this hour.   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  Small vessel disease. Atrophy.   MRI   02/07/21  Acute right pontine infarct.  MRA   02/07/21  2. Partially occluded left posterior communicating artery.  3. Sites of up to moderate stenosis within the V4 left vertebral  artery.  4. Moderate stenosis within the right PCA at the P1/P2 junction.  5. Moderate stenosis within the P3 right posterior cerebral artery.  6. Mild/moderate stenoses within the P2 and P3 segments of the left  posterior cerebral artery.  7.  Sites of moderate/severe stenosis within the A2/A3 left anterior  cerebral artery.    Carotid Doppler - unremarkable  2D Echo 02/08/21 LVEF 65-70%  LDL 138  HgbA1c 6.2  VTE prophylaxis - lovenox 40mg  daily   No antithrombotic prior to admission, now on aspirin 81 mg daily and clopidogrel 75 mg daily.    Therapy recommendations:  pending  Disposition:  peneding  ? Depression ? Anxiety  Per daughter, pt has fluctuating symptoms of  speech and left UE  Per RN, pt is anxious and verbally worried about a lot of things  Clinical presentation not correlating with MRI finding  Concerning for anxiety vs. Depression  Continue to monitoring closely  Hypertension  Home meds:  Amlodipine, clonidine, losartan   Stable . gradually normalize in 3-5 days . Long-term BP goal normotensive  Hyperlipidemia  Home meds:  none,   lipitor 40mg  daily ordered in hospital  LDL 138, goal < 70  Add lipitor   High intensity statin  Started in hospital   Continue statin at discharge  Other Stroke Risk Factors   Advanced Age >/= 59    Other Active Problems     Hospital day # 0  ATTENDING NOTE: I reviewed above note and agree with the assessment and plan. Pt was seen and examined.   87 year old female with history of seizure on Keppra admitted for confusion, falling at home, difficulty walking and bilateral lower extremity weakness.  CT no acute abnormality.  MRI showed right pontine infarct.  MRI showed intracranial stenosis.  Carotid Doppler negative.  EF 65 to 70%.  LDL 138, A1c 6.2.  UDS negative.  UA negative.  Creatinine 1.00.  On exam, patient awake alert, low spirit, flat affect, orientated to self, age, time.  No aphasia, paucity of speech, follows simple commands, able to name and repeat.  No gaze palsy, visual field fall, mild left nasolabial fold flattening.  Tongue midline.  Left upper extremity 0/5 flaccid.  Left lower extremity 2 -/5 proximal and distal.  Right upper  extremity at least 4/5, right lower extremity at least 3/5.  Sensation decreased on left lower extremity, however left upper and left face sensation symmetrical.  Right finger-to-nose intact grossly.  Gait not tested.  This AM, called by primary team stated that patient left arm flaccid, and yesterday patient has no weakness on left arm, and patient stated that this happened overnight.  Had a stat MRI repeat showed no change of right pontine infarct.  Per daughter, patient had intermittent left arm weakness and slurred speech since admission.  Per RN, patient told RN patient worries about a lot of things.  And on exam, patient low spirit, flat affect and atypical sensation pattern, taken together concerning for functional component, such as related to depression or anxiety.  We will continue to monitor.  Patient currently on aspirin 81 Plavix 75 DAPT for 3 weeks and then aspirin alone.  Continue Lipitor 40.  Continue Keppra home medication.  PT/OT pending.  We will follow.  For detailed assessment and plan, please refer to above as I have made changes wherever appropriate.   Rosalin Hawking, MD PhD Stroke Neurology 02/08/2021 4:12 PM  I spent  35 minutes in total face-to-face time with the patient, more than 50% of which was spent in counseling and coordination of care, reviewing test results, images and medication, and discussing the diagnosis, treatment plan and potential prognosis. This patient's care requiresreview of multiple databases, neurological assessment, discussion with family, other specialists and medical decision making of high complexity.  I discussed with primary team.         To contact Stroke Continuity provider, please refer to [email or web address] After hours, contact General Neurology

## 2021-02-08 NOTE — Progress Notes (Signed)
SLP Cancellation Note  Patient Details Name: Serrina Minogue MRN: [id number] DOB: 14-May-1924   Cancelled treatment:       Reason Eval/Treat Not Completed: Other (comment) (Passed swallow screen and tolerating pos well per RN. Do note acute pontine infarct which can affect swallowing. Please reconsult if difficulty is noted.)  Gabriel Rainwater MA, CCC-SLP   Weylin Plagge Meryl 02/08/2021, 11:20 AM

## 2021-02-08 NOTE — Progress Notes (Signed)
Carotid duplex has been completed.  Results can be found under chart review under CV PROC. 02/08/2021 10:09 AM Skip Litke RVT, RDMS

## 2021-02-08 NOTE — Progress Notes (Addendum)
Internal Medicine Attending Note:  Patient is a 87 yo F with a pmhx of HTN, HLD, and hypothyroidism visiting [place] from [place], [place] for her Granddaughter's graduation. 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 MRA demonstrated multivessel intracranial atherosclerosis with partially occluded left posterior communicating artery, moderate stenosis of the V4 left vertebral artery, right PCA, left PCA, and left anterior cerebral artery.  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  She was admitted for acute CVA work-up and frequent neurochecks.  Started on Lipitor, aspirin, and Plavix.  Unfortunately this morning on our evaluation she has new left upper extremity hemiparesis.  She is unable to move her left fingers or lift her arm against gravity.  She has a persistent left-sided facial droop. She reports her left upper extremity weakness developed late yesterday evening or possibly early this morning, but she did not let the nursing staff know.  Frequent neurochecks were ordered but I am unable to see where these are documented.  The team is reaching out to neurology now to see if further imaging is warranted.  She is outside of the tPA window, doubt she would be a candidate for endovascular intervention.  Otherwise, we are awaiting PT / OT evaluation. Appreciate assistance.  Carotid Doppler showed nonocclusive disease.  Echo with bubble study was negative for PFO.  Admission EKG was normal sinus rhythm.  She has remained in sinus rhythm on telemetry aside from one episode of nonsustained SVT.     Velna Ochs, MD 02/08/2021, 12:23 PM

## 2021-02-08 NOTE — Progress Notes (Signed)
Patient had difficulty with movement against gravity of left arm this morning. Throughout day, began to improve, able to lift left arm up high with some drift.

## 2021-02-08 NOTE — Progress Notes (Signed)
OT Cancellation Note  Patient Details Name: Olivia Sanders MRN: [id number] DOB: 07-13-1924   Cancelled Treatment:    Reason Eval/Treat Not Completed: Patient at procedure or test/ unavailable (Vascular). Plan to reattempt at a later date/time.  Tyrone Schimke, OT Acute Rehabilitation Services Pager: [phone] Office: [phone]  02/08/2021, 9:22 AM

## 2021-02-08 NOTE — Progress Notes (Signed)
  Echocardiogram 2D Echocardiogram has been performed.  Olivia Sanders F 02/08/2021, 9:50 AM

## 2021-02-09 DIAGNOSIS — R569 Unspecified convulsions: Secondary | ICD-10-CM

## 2021-02-09 DIAGNOSIS — H42 Glaucoma in diseases classified elsewhere: Secondary | ICD-10-CM

## 2021-02-09 LAB — CBC
HCT: 35.5 % — ABNORMAL LOW (ref 36.0–46.0)
Hemoglobin: 11.3 g/dL — ABNORMAL LOW (ref 12.0–15.0)
MCH: 26.4 pg (ref 26.0–34.0)
MCHC: 31.8 g/dL (ref 30.0–36.0)
MCV: 82.9 fL (ref 80.0–100.0)
Platelets: 276 10*3/uL (ref 150–400)
RBC: 4.28 MIL/uL (ref 3.87–5.11)
RDW: 13.6 % (ref 11.5–15.5)
WBC: 6.2 10*3/uL (ref 4.0–10.5)
nRBC: 0 % (ref 0.0–0.2)

## 2021-02-09 LAB — BASIC METABOLIC PANEL
Anion gap: 8 (ref 5–15)
BUN: 15 mg/dL (ref 8–23)
CO2: 26 mmol/L (ref 22–32)
Calcium: 9.3 mg/dL (ref 8.9–10.3)
Chloride: 105 mmol/L (ref 98–111)
Creatinine, Ser: 1.02 mg/dL — ABNORMAL HIGH (ref 0.44–1.00)
GFR, Estimated: 51 mL/min — ABNORMAL LOW (ref >60)
Glucose, Bld: 110 mg/dL — ABNORMAL HIGH (ref 70–99)
Potassium: 3.8 mmol/L (ref 3.5–5.1)
Sodium: 139 mmol/L (ref 135–145)

## 2021-02-09 MED ORDER — LATANOPROST 0.005 % OP SOLN
1.0000 [drp] | Freq: Every day | OPHTHALMIC | Status: DC
  Administered 2021-02-09 – 2021-02-10 (×2): 1 [drp] via OPHTHALMIC
  Filled 2021-02-09: qty 2.5

## 2021-02-09 MED ORDER — CLOBETASOL PROPIONATE 0.05 % EX OINT
1.0000 "application " | TOPICAL_OINTMENT | Freq: Two times a day (BID) | CUTANEOUS | Status: DC
  Administered 2021-02-09 – 2021-02-11 (×4): 1 via TOPICAL
  Filled 2021-02-09: qty 15

## 2021-02-09 NOTE — Evaluation (Signed)
Physical Therapy Evaluation Patient Details Name: Olivia Sanders MRN: [id number] DOB: 1924-02-16 Today's Date: 02/09/2021   History of Present Illness  Olivia Sanders is a 87 y.o. 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 MRI brain showed an acute right pontine infarct. Two days prior to arrival, she fell while taking out the trash and broke her right wrist.  Clinical Impression   Pt admitted with above diagnosis. Comes from her home in [place], [place], where she lives in a two story home with a stair lift to access 2nd story; Walks household distances at baseline with RW; Very good family support in [place]; Presents to PT with L sided weakness and dyscoordination, decr use of RUE due to fx and short-arm cast, dependencies in functional mobility and ADLs; Currently requires Max assist for bed mobility and transfers; Still, once up and OOB abd awake, she participated very well; Considering good family support, relative independence in functional mobility premorbidly, and good participation this session, Olivia Sanders has very good rehab potential;  Pt currently with functional limitations due to the deficits listed below (see PT Problem List). Pt will benefit from skilled PT to increase their independence and safety with mobility to allow discharge to the venue listed below.    I believe going to post-acute rehab in her hometown [place], [place] will most benefit pt, so that she will have support and encouragement from her family     Follow Up Recommendations SNF;Supervision/Assistance - 24 hour;Other (comment) (Back to her home in [place], [place])    Financial risk analyst (measurements PT);Wheelchair cushion (measurements PT);3in1 (PT);Other (comment)  (perhaps drop-arm BSC; R platform for RW; can defer equipment recs to post-acute rehab -- must look into options for transportation to post-acute rehab in [place], [place])    Recommendations for Other Services       Precautions / Restrictions Precautions Precautions: Fall Precaution Comments: presume NWB on RUE due to cast from broken wrist, orders not in her chart      Mobility  Bed Mobility Overal bed mobility: Needs Assistance Bed Mobility: Rolling Rolling: Max assist         General bed mobility comments: Mqx assist to roll R and L for lift pad placement    Transfers Overall transfer level: Needs assistance Equipment used: 1 person hand held assist;Ambulation equipment used Transfers: Sit to/from Stand (and mechanical lift transfer OOB to chair) Sit to Stand: Max assist         General transfer comment: Used Maximove to safely and securely help pt OOB to recliner; Once up in recliner, able to work on sit to stand transfers with Max assist and initial knee block for stability  Ambulation/Gait                Stairs            Wheelchair Mobility    Modified Rankin (Stroke Patients Only)       Balance     Sitting balance-Leahy Scale: Fair Sitting balance - Comments: Worked on pulling self from supported sitting in recliner to unsupported sitting; Light mod assist to come forward off of the back of the chair; Noted trunkl muscle recruitment, and initaiting use of L UE to help with pulling herself forward     Standing balance-Leahy  Scale: Poor Standing balance comment: Briefly stood from recliner, and able to keep knees and hips in near extension for approx 15 seconds without buckling                             Pertinent Vitals/Pain Pain Assessment: No/denies pain    Home Living Family/patient expects to be discharged to:: Private residence Living Arrangements: Children Available Help at Discharge: Family Type of Home: House Home  Access: Stairs to enter   Technical brewer of Steps: 1 step into the den, 2 steps into main level Home Layout: Two level Home Equipment: Shower seat;Tub bench;Walker - 2 wheels Additional Comments: family is close by but unable to provide 24/7 assistance, is able to provide prn; pt lives in [place]    Prior Function Level of Independence: Independent with assistive device(s)         Comments: pt takes care of her granddaughter who recently had a stroke, granddaughter lives with pt. Pt was independent with ADL/IADL and functional mobility at RW level. Pt was not driving.     Hand Dominance   Dominant Hand: Right    Extremity/Trunk Assessment   Upper Extremity Assessment Upper Extremity Assessment: Defer to OT evaluation    Lower Extremity Assessment Lower Extremity Assessment: Generalized weakness;LLE deficits/detail LLE Deficits / Details: 2/5 MMT ankle, knee, hip LLE Coordination: decreased fine motor;decreased gross motor    Cervical / Trunk Assessment Cervical / Trunk Assessment: Kyphotic  Communication   Communication: No difficulties  Cognition Arousal/Alertness: Awake/alert Behavior During Therapy: WFL for tasks assessed/performed Overall Cognitive Status: Within Functional Limits for tasks assessed                                 General Comments: More interactive and conversational than earlier today      General Comments General comments (skin integrity, edema, etc.): Pt and daughter educated in increasing stimulation and interaction with her environment on her L side; Family face-time'd in from [place] during session    Exercises Other Exercises Other Exercises: educated pt on importance of mobilizing LUE/LLE as much as possible and importance of elevating LUE   Assessment/Plan    PT Assessment Patient needs continued PT services  PT Problem List Decreased strength;Decreased range of motion;Decreased activity tolerance;Decreased  balance;Decreased mobility;Decreased coordination;Decreased cognition;Decreased knowledge of use of DME;Decreased safety awareness;Decreased knowledge of precautions       PT Treatment Interventions DME instruction;Gait training;Stair training;Functional mobility training;Therapeutic activities;Therapeutic exercise;Balance training;Neuromuscular re-education;Cognitive remediation;Patient/family education    PT Goals (Current goals can be found in the Care Plan section)  Acute Rehab PT Goals Patient Stated Goal: to get back to normal PT Goal Formulation: With patient Time For Goal Achievement: 02/23/21 Potential to Achieve Goals: Good    Frequency Min 4X/week   Barriers to discharge Other (comment) Will need to look into transportation options to get to post-acute rehab in [place], [place]    Co-evaluation               AM-PAC PT "6 Clicks" Mobility  Outcome Measure Help needed turning from your back to your side while in a flat bed without using bedrails?: A Lot Help needed moving from lying on your back to sitting on the side of a flat bed without using bedrails?: A Lot Help needed moving to and from a bed to a chair (including a wheelchair)?: A Lot  Help needed standing up from a chair using your arms (e.g., wheelchair or bedside chair)?: A Lot Help needed to walk in hospital room?: Total Help needed climbing 3-5 steps with a railing? : Total 6 Click Score: 10    End of Session Equipment Utilized During Treatment: Gait belt;Other (comment) (Maximove) Activity Tolerance: Patient tolerated treatment well Patient left: in chair;with call bell/phone within reach;with family/visitor present Nurse Communication: Mobility status PT Visit Diagnosis: Hemiplegia and hemiparesis Hemiplegia - Right/Left: Right Hemiplegia - dominant/non-dominant: Dominant Hemiplegia - caused by: Cerebral infarction    Time: 0981-1914 PT Time Calculation (min) (ACUTE ONLY): 56 min   Charges:   PT  Evaluation $PT Eval Moderate Complexity: 1 Mod PT Treatments $Therapeutic Activity: 38-52 mins        Roney Marion, PT  Acute Rehabilitation Services Pager [phone] Office [place] 02/09/2021, 2:04 PM

## 2021-02-09 NOTE — TOC Initial Note (Signed)
Transition of Care [facility]) - Initial/Assessment Note    Patient Details  Name: Olivia Sanders MRN: [id number] Date of Birth: [birth year]  Transition of Care [facility]) CM/SW Contact:    Vinie Sill, LCSW Phone Number: 02/09/2021, 3:16 PM  Clinical Narrative:                  CSW spoke with patient's daughter,Olivia Sanders and patient via speaker phone. CSW introduced self and explained role. CSW discussed short term rehab at [facility]. Patient and family were agreeable. CSW explained the SNF process. CSW informed family will be responsible for cost of EMS transport. Family states understanding. Advised, will give estimate of cost once SNF Family requested SNF in the [place], [place] area. [place] [phone]), [facility] [phone]) and last choice [phone]). Family also interested in [facility] [phone] they are inpatient rehab facility(CSW updated the family).  CSW faxed clinicals to requested SNFs TOC will provide bed offers once available  TOC will continue to follow and assist with discharge planning.  Thurmond Butts, MSW, LCSW Clinical Social Worker   Expected Discharge Plan: Skilled Nursing Facility Barriers to Discharge: Continued Medical Work up,SNF Pending bed offer   Patient Goals and CMS Choice     Choice offered to / list presented to : [place]  Expected Discharge Plan and Services Expected Discharge Plan: [place] In-house Referral: Clinical Social Work     Living arrangements for the past 2 months: Single Family Home                                      Prior Living Arrangements/Services Living arrangements for the past 2 months: Single Family Home Lives with:: Self                   Activities of Daily Living      Permission Sought/Granted Permission sought to share information with : Family Supports Permission granted  to share information with : Yes, Verbal Permission Granted              Emotional Assessment              Admission diagnosis:  Stroke [facility]) [I63.9] Cerebrovascular accident (CVA), unspecified mechanism ([place]) [I63.9] Patient Active Problem List   Diagnosis Date Noted  . Stroke ([place]) 02/07/2021   PCP:  Pcp, No Pharmacy:  No Pharmacies Listed    Social Determinants of Health (SDOH) Interventions    Readmission Risk Interventions No flowsheet data found.

## 2021-02-09 NOTE — Progress Notes (Addendum)
Subjective:  Patient is feeling better this morning than yesterday. She is unable to specify. She still feels weak in her left arm, but she feels no new weakness anywhere else. She endorses a robust appetite. She had a bowel movement today of her normal quality. She also has been urinating well through an external catheter.   Patient knows she is in the hospital but cannot identify why. She states she does not feel confused here in the hospital. Daughter at bedside. She states her mother is tired from occupational therapy. She also wants patient's care transferred closer home in [place], [place], whenever possible, as this is where she is most comfortable and where her support system is. She states that her mother's friends and church community could help her recover, as she lives in a two-story home with a stair lift. She also lives alone after her other daughter who lived with her had a stroke and is unable to care for her.   Objective:  Vital signs in last 24 hours: Vitals:   02/09/21 0029 02/09/21 0520 02/09/21 1047 02/09/21 1230  BP: (!) 181/76 (!) 169/81 130/68 (!) 156/74  Pulse: 95 96 88 87  Resp: 18 18 16 16   Temp: 99.4 F (37.4 C) 99 F (37.2 C) 99.5 F (37.5 C) 97.8 F (36.6 C)  TempSrc: Oral Oral Oral Oral  SpO2: 100% 99% 97% 98%   Weight change:   Intake/Output Summary (Last 24 hours) at 02/09/2021 1622 Last data filed at 02/09/2021 4098 Gross per 24 hour  Intake --  Output 700 ml  Net -700 ml   Physical Exam Constitutional:      General: She is awake.     Appearance: She is well-developed.     Comments: She is quiet and appears tired, but she is alert.  Cardiovascular:     Rate and Rhythm: Normal rate and regular rhythm.     Pulses:          Dorsalis pedis pulses are 0 on the right side and 0 on the left side.     Comments: Her feet are warm to the touch. Pulmonary:     Comments: Normal breath sounds anteriorly. Breathing effort is normal. Musculoskeletal:      Right lower leg: No edema.     Left lower leg: No edema.  Feet:     Comments: Calluses present over lateral calcaneous bilaterally. Hammer toes present in all toes without pressure injury. Hallux valgus present bilaterally. Skin:    Comments: Skin is hot in bilateral upper extremities. Forehead and remainder of body at normal temperature. Large bulla on anterolateral R shin. Small vesicle medially on R calf. Hyperpigmented, annular plaques noted along anteriomedial lower legs bilaterally. Marked dystrophy of all toenails.  Neurological:     Sensory: Sensation is intact.     Comments: Oriented to person and place. L sided, lower facial drooping at rest and with smiling. Slight tongue deviation to the L. 3/5 strength of LUE. 0/5 strength of LLE. RUE and RLE both 4/5 strength. Slurred speech.    Assessment/Plan:  Active Problems:   Stroke [facility])  Patient Summary: Olivia Sanders is a 87 y.o. female with a history of HTN, HLD, hypothyroidism, and undetermined seizure disorder who is hospital day 3 for evaluation of acute right pontine stroke.  1. Acute right pontine infarct Patient is feeling better this morning overall, but she is unable to specify. She continues to endorse weakness in her LUE. It seems to have improved from  0-1/5 in strength yesterday morning to 3/5 strength today after positive reinforcement. LLE strength worsened from 5/5 on admission to 0-1/5 today. Facial droop and slurring of speech are unchanged. Repeat MRI per neurology demonstrated no change to pontine infarct, appreciate assistance. MRA showed atherosclerotic disease and stenosis in L posterior communicating artery, left vertebral artery, right PCA, left PCA, and left ACA. Carotid US unremarkable. Echo demonstrated LVEF 78-93%, grade I diastolic dysfunction, no PFO, and mild aortic valve stenosis. Overall picture concerning for small vessel disease as the source of the stroke in the setting of hypertension and  hyperlipidemia history. PT and OT consulted and recommend SNF placement, appreciate assistance. Patient and daughter amenable to SNF back home in [place], [place] for placement at SNF for rehabilitation -Continue atorvastatin 40 mg daily -Continue DAPT daily and discontinue plavix 75 mg after 3 weeks -Discontinue telemetry due to no concern for arrhythmia or significant cardiac source for stroke and its deliriogenic nature -Monitor vitals, serial neuro exams   2. Hypertension Continue to allow for permissive hypertension (goal <220/110) in setting of acute stroke. Her BP has remained fairly stable throughout admission at 140-150s/50-70s with some recent readings in the 160-180s/70-80s. Would expect her to have higher BP readings in the setting of held BP medications. Considering hypotension secondary to medication usage as contributing cause of stroke. Hypotension in conjunction with known atherosclerotic disease on MRA could have resulted in ischemia due to low pressure system unable to pump blood against occluded vessels to tissues. -Hold homeamlodipine 10 mg, furosemide 20 mg, clonidine 0.1 mg, and losartan 50 mg daily  3. Hypothyroidism Patient's family reported history of hypothyroidism. She has endorsed constipation and fatigue, and she has multiple blankets on her at all times. No TSH values on file to assess status. -Order TSH -Continue home synthroid 25 mcg daily for now  4. Seizures Patient and family reports history of undetermined seizure. Last seizure was 3 years ago and she has been taking keppra for 2 years. It is unclear why keppra was started a year after her last seizure. Patient appears tired today but has demonstrated no seizure like activity since admission. -Continue keppra 500 mg twice daily  5. GERD Patient's family reported history of GERD. Patient has not endorsed any reflux, dysphagia, burning after meals, or chest pain. -Continue home pantoprazole 40 mg  daily  6. Glaucoma Patient's daughter states patient has not been receiving her latanoprost. -Order latanoprost 0.005% one drop in both eyes daily at bedtime  7. Mycosis fungoides Patient's daughter states patient has not been receiving her clobetasol. Her physical exam findings of hyperpigmented, annular plaques are consistent with mycosis fungoides. -Order clobetasol 0.05% topically to legs twice daily   LOS: 1 day   Mikal Plane, Medical Student 02/09/2021, 4:22 PM Pager: [phone] After 5pm on weekdays and 1pm on weekends: On Call pager [phone]

## 2021-02-09 NOTE — NC FL2 (Signed)
[place] MEDICAID FL2 LEVEL OF CARE SCREENING TOOL     IDENTIFICATION  Patient Name: Olivia Sanders Birthdate: [birth year] Sex: female Admission Date (Current Location): 02/07/2021  [facility] [place] Number:      Facility and Address:  The . [facility], [place] [address] [place], [place] [address]      Provider Number: [id number]  Attending Physician Name and Address:  Angelica Pou, MD  Relative Name and Phone Number:       Current Level of Care: Hospital Recommended Level of Care: [place] Prior Approval Number:    Date Approved/Denied:   PASRR Number:    Discharge Plan: SNF    Current Diagnoses: Patient Active Problem List   Diagnosis Date Noted  . Stroke ([place]) 02/07/2021    Orientation RESPIRATION BLADDER Height & Weight     Self,Time,Situation,Place  Normal External catheter,Incontinent Weight:   Height:     BEHAVIORAL SYMPTOMS/MOOD NEUROLOGICAL BOWEL NUTRITION STATUS      Continent Diet (please see discharge summary)  AMBULATORY STATUS COMMUNICATION OF NEEDS Skin   Supervision Verbally Normal                       Personal Care Assistance Level of Assistance  Bathing,Feeding,Dressing Bathing Assistance: Maximum assistance Feeding assistance: Limited assistance Dressing Assistance: Maximum assistance     Functional Limitations Info  Sight,Hearing,Speech Sight Info: Adequate Hearing Info: Adequate Speech Info: Adequate    SPECIAL CARE FACTORS FREQUENCY  PT (By licensed PT),OT (By licensed OT)     PT Frequency: 5x per week OT Frequency: 5x per week            Contractures Contractures Info: Not present    Additional Factors Info  Code Status,Allergies Code Status Info: Full Code Allergies Info: Penicillins           Current Medications (02/09/2021):  This is the current hospital active medication list Current Facility-Administered Medications  Medication Dose Route Frequency Provider  Last Rate Last Admin  . acetaminophen (TYLENOL) tablet 650 mg  650 mg Oral Q6H PRN Maudie Mercury, MD   650 mg at 02/07/21 2216   Or  . acetaminophen (TYLENOL) suppository 650 mg  650 mg Rectal Q6H PRN Maudie Mercury, MD      . aspirin EC tablet 81 mg  81 mg Oral Daily Rosalin Hawking, MD   81 mg at 02/09/21 0913  . atorvastatin (LIPITOR) tablet 40 mg  40 mg Oral Daily Rosalin Hawking, MD   40 mg at 02/09/21 0913  . clobetasol ointment (TEMOVATE) 3.79 % 1 application  1 application Topical BID Iona Beard, MD      . clopidogrel (PLAVIX) tablet 75 mg  75 mg Oral Daily Rosalin Hawking, MD   75 mg at 02/09/21 0913  . enoxaparin (LOVENOX) injection 40 mg  40 mg Subcutaneous Daily Maudie Mercury, MD   40 mg at 02/09/21 0913  . latanoprost (XALATAN) 0.005 % ophthalmic solution 1 drop  1 drop Both Eyes QHS Iona Beard, MD      . levETIRAcetam (KEPPRA) tablet 500 mg  500 mg Oral BID Maudie Mercury, MD   500 mg at 02/09/21 0913  . levothyroxine (SYNTHROID) tablet 25 mcg  25 mcg Oral Q0600 Iona Beard, MD   25 mcg at 02/09/21 [phone]  . pantoprazole (PROTONIX) EC tablet 40 mg  40 mg Oral Daily Iona Beard, MD   40 mg at 02/09/21 0913  . polyethylene glycol (MIRALAX / GLYCOLAX)  packet 17 g  17 g Oral Daily PRN Maudie Mercury, MD         Discharge Medications: Please see discharge summary for a list of discharge medications.  Relevant Imaging Results:  Relevant Lab Results:   Additional Information SSN # [id number]- patient has received covid vaccine and both booster shots  Vinie Sill, LCSW

## 2021-02-09 NOTE — Evaluation (Signed)
Occupational Therapy Evaluation Patient Details Name: Olivia Sanders MRN: [id number] DOB: 1924-06-13 Today's Date: 02/09/2021    History of Present Illness Olivia Sanders is a 87 y.o. 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 MRI brain showed an acute right pontine infarct. Two days prior to arrival, she fell while taking out the trash and broke her right wrist.   Clinical Impression   PTA, pt was independent with ADL/IADL and modified independent at RW level. Pt had a fall 2 days prior to admission. Pt lives with her granddaughter, who recently had a stroke, and is unable to provide assistance for pt. Pt's daughter present during session. Pt minimally conversant during session. She required maxA for bed mobility and modA for stability sitting EOB secondary to left lateral lean and posterior lean. Pt presents with LUE and LLE profound weakness with trace activation in LUE and 2/5 MMT for LLE. RN notified. Due to decline in current level of function, pt would benefit from acute OT to address established goals to facilitate safe D/C to venue listed below. At this time, recommend SNF follow-up. Will continue to follow acutely.  Pt and daughter requesting a SNF close to pt's home in [place], [place]. Communicated this with CM.     Follow Up Recommendations  SNF;Supervision/Assistance - 24 hour    Equipment Recommendations  3 in 1 bedside commode    Recommendations for Other Services       Precautions / Restrictions Precautions Precautions: Fall Precaution Comments: presume NWB on RUE due to cast from broken wrist, orders not in her chart      Mobility Bed Mobility Overal bed mobility: Needs Assistance Bed Mobility: Supine to Sit;Sit to Supine      Supine to sit: Max assist;HOB elevated Sit to supine: Max assist;HOB elevated   General bed mobility comments: maxA for all aspects, pt attempting to progress BLE to EOB but due to LLE weakness required assistance for progression    Transfers                 General transfer comment: deferred    Balance Overall balance assessment: Needs assistance Sitting-balance support: Single extremity supported;Feet supported Sitting balance-Leahy Scale: Poor Sitting balance - Comments: requires moderate assistance for stability sitting EOB, pt with left lateral lean and posterior lean. Pt required assistance for postural adjustments Postural control: Posterior lean;Left lateral lean                                 ADL either performed or assessed with clinical judgement   ADL Overall ADL's : Needs assistance/impaired Eating/Feeding: Maximal assistance   Grooming: Maximal assistance   Upper Body Bathing: Maximal assistance   Lower Body Bathing: Total assistance   Upper Body Dressing : Maximal assistance   Lower Body Dressing: Total assistance     Toilet Transfer Details (indicate cue type and reason): did not assess   Toileting - Clothing Manipulation Details (indicate cue type and reason): did not assess       General ADL Comments: pt limited to sitting EOB, pt limited by decrease activity tolerance, instability, left lateral lean requiring moderate assistance for postural stability and correction     Vision Patient Visual Report: No change  from baseline Vision Assessment?: Yes Eye Alignment: Within Functional Limits Ocular Range of Motion: Within Functional Limits Alignment/Gaze Preference: Within Defined Limits Tracking/Visual Pursuits: Able to track stimulus in all quads without difficulty Saccades: Within functional limits Convergence: Within functional limits     Perception     Praxis      Pertinent Vitals/Pain Pain Assessment: No/denies  pain     Hand Dominance Right   Extremity/Trunk Assessment Upper Extremity Assessment Upper Extremity Assessment: RUE deficits/detail;LUE deficits/detail RUE Deficits / Details: cast in place from MCP to forearm, pt fell prior to arrival. PT with generalized weakness unable to thoroughly assess secondary to pain in digits RUE Coordination: decreased fine motor;decreased gross motor LUE Deficits / Details: trace activation of hand, wrist, forearm and shoulder. Pt reports sensation intact. full PROM all joints LUE Sensation: WNL LUE Coordination: decreased fine motor;decreased gross motor   Lower Extremity Assessment Lower Extremity Assessment: LLE deficits/detail;Defer to PT evaluation LLE Deficits / Details: 2/5 MMT ankle, knee, hip LLE Sensation: WNL LLE Coordination: decreased fine motor;decreased gross motor   Cervical / Trunk Assessment Cervical / Trunk Assessment: Kyphotic   Communication Communication Communication: No difficulties (pt minimally conversant this date)   Cognition Arousal/Alertness: Awake/alert Behavior During Therapy: WFL for tasks assessed/performed Overall Cognitive Status: Within Functional Limits for tasks assessed                                 General Comments: oriented x4 WFL for basic ADL and mobility;continue to assess higher cognitive functioning, pt knew she was at a hopsital but unable to specify [employer], pt with minimal conversation this date   General Comments  HR 90s at rest    Exercises Exercises: Other exercises Other Exercises Other Exercises: educated pt on importance of mobilizing LUE/LLE as much as possible and importance of elevating LUE   Shoulder Instructions      Home Living Family/patient expects to be discharged to:: Private residence Living Arrangements: Children Available Help at Discharge: Family Type of Home: House Home Access: Stairs to enter [employer] of Steps: 1 step into the den, 2  steps into main level   Home Layout: Two level Alternate Level Stairs-Number of Steps: pt has a chair lift   Bathroom Shower/Tub: Tub/shower unit;Walk-in Psychologist, prison and probation services: Standard     Home Equipment: Shower seat;Tub bench;Walker - 2 wheels   Additional Comments: family is close by but unable to provide 24/7 assistance, is able to provide prn; pt lives in [place]      Prior Functioning/Environment Level of Independence: Independent        Comments: pt takes care of her granddaughter who recently had a stroke, granddaughter lives with pt. Pt was independent with ADL/IADL and functional mobility at RW level. Pt was not driving.        OT Problem List: Decreased strength;Decreased range of motion;Impaired balance (sitting and/or standing);Decreased activity tolerance;Decreased safety awareness;Decreased knowledge of use of DME or AE;Decreased knowledge of precautions;Cardiopulmonary status limiting activity;Impaired UE functional use      OT Treatment/Interventions: Self-care/ADL training;Therapeutic exercise;Energy conservation;DME and/or AE instruction;Therapeutic activities;Patient/family education;Balance training    OT Goals(Current goals can be found in the care plan section) Acute Rehab OT Goals Patient Stated Goal: to get back to normal OT Goal Formulation: With patient Time For Goal Achievement: 02/23/21 Potential to Achieve Goals: Good ADL Goals Pt Will Perform Lower Body Dressing: with min assist;sit to/from stand  Pt Will Transfer to Toilet: with min assist;bedside commode;stand pivot transfer Additional ADL Goal #1: Pt will complete bed mobility with minimal assistance in preparation for ADL and functional mobility.  OT Frequency: Min 2X/week   Barriers to D/C:            Co-evaluation              AM-PAC OT "6 Clicks" Daily Activity     Outcome Measure Help from another person eating meals?: A Lot Help from another person taking care of  personal grooming?: A Lot Help from another person toileting, which includes using toliet, bedpan, or urinal?: Total Help from another person bathing (including washing, rinsing, drying)?: A Lot Help from another person to put on and taking off regular upper body clothing?: A Lot Help from another person to put on and taking off regular lower body clothing?: Total 6 Click Score: 10   End of Session Nurse Communication: Mobility status  Activity Tolerance: Patient tolerated treatment well Patient left: in bed;with call bell/phone within reach;with bed alarm set;with family/visitor present  OT Visit Diagnosis: Unsteadiness on feet (R26.81);Other abnormalities of gait and mobility (R26.89);Muscle weakness (generalized) (M62.81);History of falling (Z91.81);Other symptoms and signs involving cognitive function;Hemiplegia and hemiparesis Hemiplegia - Right/Left: Left Hemiplegia - dominant/non-dominant: Non-Dominant Hemiplegia - caused by: Cerebral infarction                Time: 0920-0953 OT Time Calculation (min): 33 min Charges:  OT General Charges $OT Visit: 1 Visit OT Evaluation $OT Eval Moderate Complexity: 1 Mod OT Treatments $Self Care/Home Management : 8-22 mins  Helene Kelp OTR/L Acute Rehabilitation Services Office: [place] 02/09/2021, 10:28 AM

## 2021-02-09 NOTE — Progress Notes (Signed)
STROKE TEAM PROGRESS NOTE   INTERVAL HISTORY Her daughter at the bedside and her granddaughter is on the phone with facetime. Pt was lifted to chair, she is awake alert, still has left arm and leg weakness but able to against gravity, much better than yesterday. PT/OT recommend SNF  Vitals:   02/08/21 2031 02/09/21 0029 02/09/21 0520 02/09/21 1047  BP: (!) 176/74 (!) 181/76 (!) 169/81 130/68  Pulse: 89 95 96 88  Resp: 18 18 18 16   Temp: 98.8 F (37.1 C) 99.4 F (37.4 C) 99 F (37.2 C) 99.5 F (37.5 C)  TempSrc: Oral Oral Oral Oral  SpO2: 97% 100% 99% 97%   CBC:  Recent Labs  Lab 02/07/21 0831 02/07/21 0847 02/08/21 0320 02/09/21 0341  WBC 4.9  --  5.1 6.2  NEUTROABS 3.5  --   --   --   HGB 11.1*   < > 10.8* 11.3*  HCT 34.8*   < > 33.6* 35.5*  MCV 82.5  --  82.2 82.9  PLT 263  --  267 276   < > = values in this interval not displayed.   Basic Metabolic Panel:  Recent Labs  Lab 02/08/21 0320 02/09/21 0341  NA 139 139  K 3.7 3.8  CL 103 105  CO2 28 26  GLUCOSE 102* 110*  BUN 15 15  CREATININE 1.01* 1.02*  CALCIUM 9.3 9.3    Lipid Panel:  Recent Labs  Lab 02/07/21 1359  CHOL 202*  TRIG 105  HDL 43  CHOLHDL 4.7  VLDL 21  LDLCALC 138*    HgbA1c:  Recent Labs  Lab 02/07/21 1359  HGBA1C 6.2*   Urine Drug Screen:  Recent Labs  Lab 02/07/21 1300  LABOPIA NONE DETECTED  COCAINSCRNUR NONE DETECTED  LABBENZ NONE DETECTED  AMPHETMU NONE DETECTED  THCU NONE DETECTED  LABBARB NONE DETECTED    Alcohol Level  Recent Labs  Lab 02/07/21 0831  ETH <10    IMAGING past 24 hours MR BRAIN WO CONTRAST  Result Date: 02/08/2021 CLINICAL DATA:  Stroke, follow-up. EXAM: MRI HEAD WITHOUT CONTRAST TECHNIQUE: Multiplanar, multiecho pulse sequences of the brain and surrounding structures were obtained without intravenous contrast. COMPARISON:  Noncontrast head CT 02/07/2021. MRI/MRA head 02/07/2021. FINDINGS: A limited protocol brain MRI was performed at the  ordering provider's request. Only axial and coronal diffusion-weighted sequences and an axial SWI sequence were acquired. An acute infarct within the right pons has not significantly changed in size as compared to the brain MRI of 02/07/2021, again measuring 1.7 x 0.9 x 1.1 cm. No evidence of hemorrhagic conversion. No interval acute infarct is identified elsewhere. IMPRESSION: Limited protocol brain MRI consisting of only diffusion-weighted and SWI sequences. An acute infarct within the right pons is not significantly changed in size as compared to the brain MRI of 02/07/2021, again measuring 1.7 x 0.9 x 1.1 cm. No evidence of hemorrhagic conversion. Electronically Signed   By: Kellie Simmering DO   On: 02/08/2021 14:43    PHYSICAL EXAM  Temp:  [98.8 F (37.1 C)-99.8 F (37.7 C)] 99.5 F (37.5 C) (05/16 1047) Pulse Rate:  [88-98] 88 (05/16 1047) Resp:  [16-18] 16 (05/16 1047) BP: (130-181)/(63-81) 130/68 (05/16 1047) SpO2:  [97 %-100 %] 97 % (05/16 1047)  General - Well nourished, well developed, in no apparent distress.  Ophthalmologic - fundi not visualized due to noncooperation.  Cardiovascular - Regular rhythm and rate.  Neuro - awake alert, orientated to self, age, time.  No  aphasia, but paucity of speech, follows simple commands, able to name and repeat.  No gaze palsy, visual field fall, mild left nasolabial fold flattening.  Tongue midline.  Left upper extremity 3/5 proximally and 3+/5 distally.  Left lower extremity 2+/5 proximal and distal.  Right upper extremity on flint below elbow but proximally at least 4/5, right lower extremity at least 3/5.  Sensation bilaterally symmetrical.  Right finger-to-nose intact grossly.  Gait not tested.   ASSESSMENT/PLAN Ms. Olivia Sanders is a 87 y.o. female with history of  HTN, HLD, and hypothyroidism admitted for weakness, dysphagia, dysarthria, and left sided facial droop, and LLE weakness.  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  Small vessel disease. Atrophy.   MRI   02/07/21 Acute right pontine infarct.  MRA  Intracranial stenosis including partially occluded left posterior communicating artery. Sites of up to moderate stenosis within the V4 left vertebral artery. Moderate stenosis within the right PCA at the P1/P2 junction. Moderate stenosis within the P3 right posterior cerebral artery. Mild/moderate stenoses within the P2 and P3 segments of the left posterior cerebral artery. Sites of moderate/severe stenosis within the A2/A3 left anterior cerebral artery.  MRI repeat 02/08/21 acute right pontine infarct, no significant change  Carotid Doppler - unremarkable  2D Echo LVEF 65-70%  LDL 138  HgbA1c 6.2  VTE prophylaxis - lovenox 40mg  daily   No antithrombotic prior to admission, now on aspirin 81 mg daily and clopidogrel 75 mg daily for 3 weeks and then ASA alone.  Therapy recommendations:  SNF  Disposition:  peneding  ? Depression ? Anxiety  Per daughter, pt has fluctuating symptoms of speech and left UE  Per RN, pt is anxious and verbally worried about a lot of things  Clinical presentation not correlating with MRI finding (no change of MRI repeat)  Concerning for anxiety vs. Depression  Improved today  Hypertension  Home meds:  Amlodipine, clonidine, losartan  BP Stable . gradually normalize in 3-5 days . Long-term BP goal normotensive  Hyperlipidemia  Home meds:  none  LDL 138, goal < 70  lipitor 40mg  daily ordered in hospital  Continue statin at discharge  Other Stroke Risk Factors   Advanced Age >/= 35    Other Active Problems     Hospital day # 1  Neurology will sign off. Please call with questions. Pt will follow up with her local neurologist. Thanks for the consult.   Rosalin Hawking, MD PhD Stroke Neurology 02/09/2021 12:08 PM           To contact Stroke Continuity provider, please refer to  [email or web address] After hours, contact General Neurology

## 2021-02-10 ENCOUNTER — Inpatient Hospital Stay (HOSPITAL_COMMUNITY): Payer: Medicare Other

## 2021-02-10 LAB — TSH: TSH: 3.204 u[IU]/mL (ref 0.350–4.500)

## 2021-02-10 IMAGING — DX DG CHEST 1V PORT
1 series · 1 of 1 positions shown · non-contrast
Comparison: None.

CLINICAL DATA: Cough.

EXAM:
PORTABLE CHEST 1 VIEW

[chest ap]
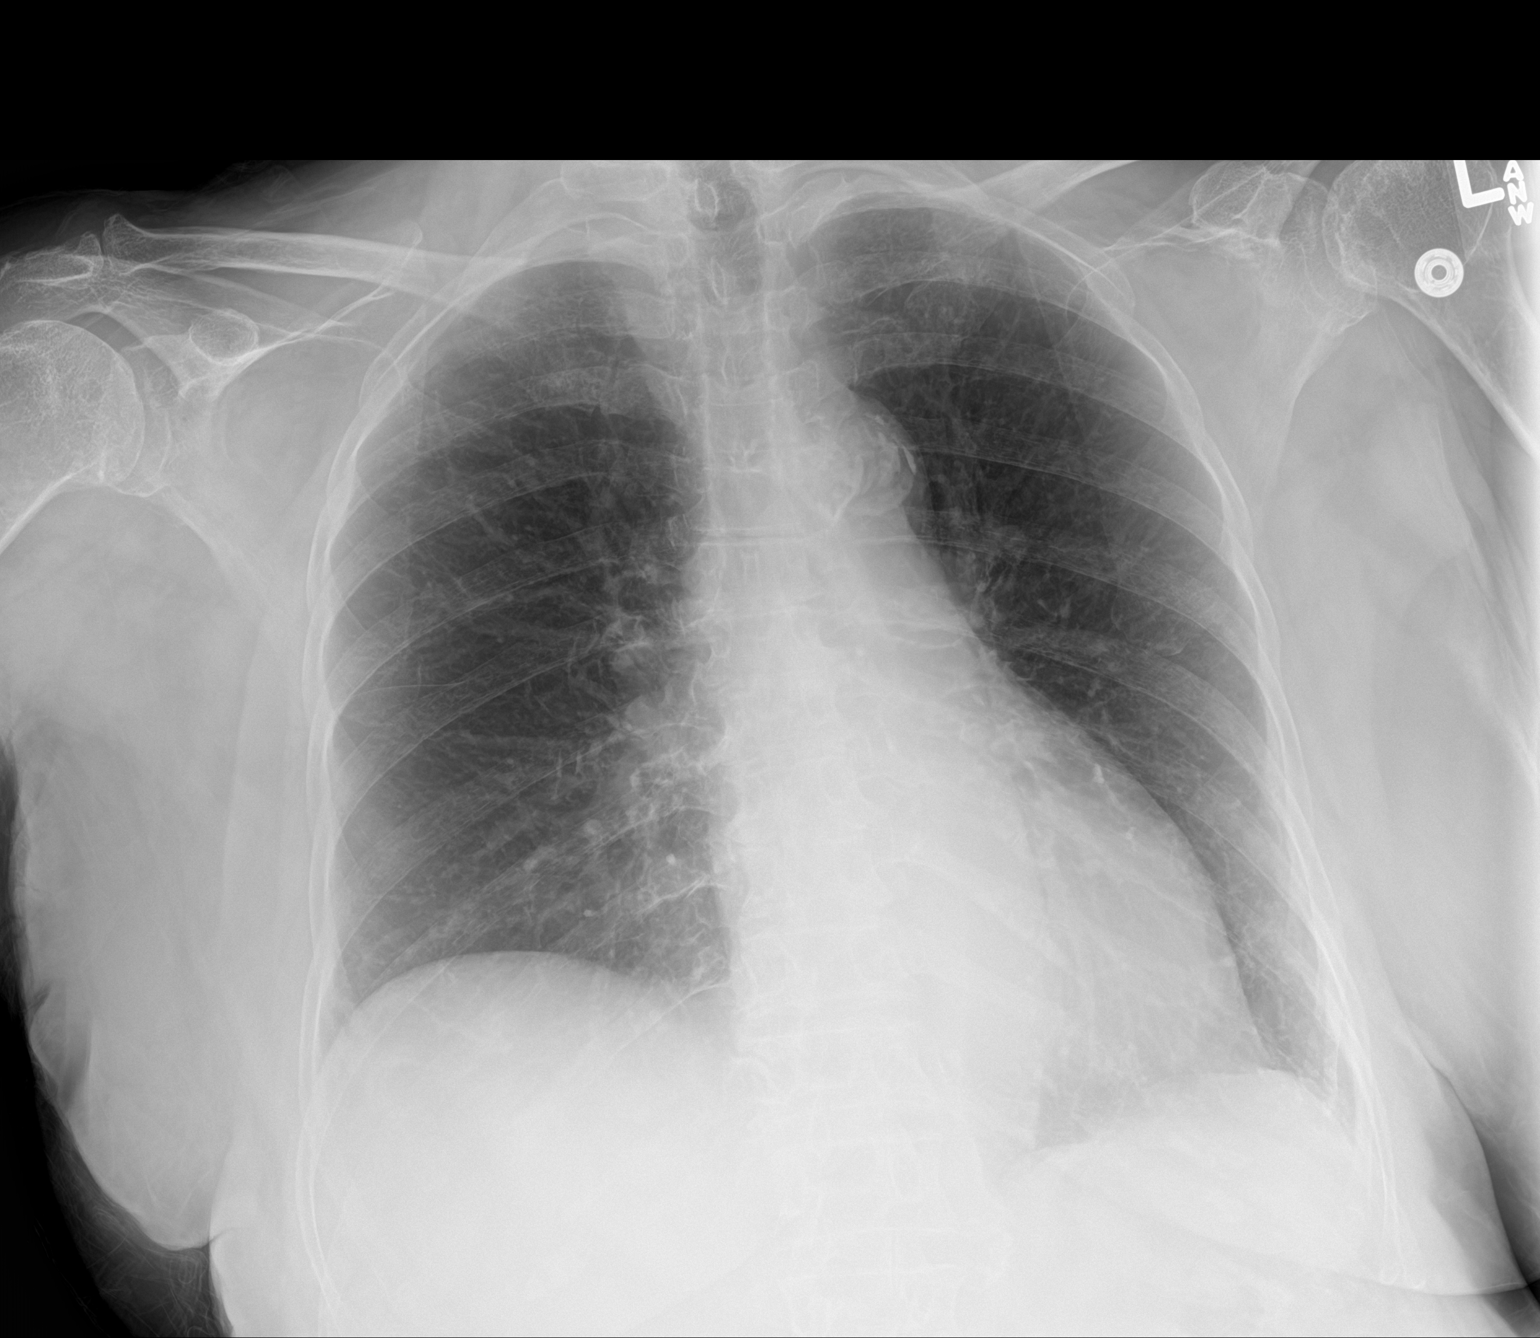

[1 of 1 positions shown; findings below may reference images not displayed]

FINDINGS: The heart size and mediastinal contours are within normal limits.
Both lungs are clear. The visualized skeletal structures are
unremarkable.
IMPRESSION: No active disease.

Aortic Atherosclerosis ([7C]-[7C]).

## 2021-02-10 NOTE — Progress Notes (Signed)
Physical Therapy Treatment Patient Details Name: Olivia Sanders MRN: [id number] DOB: 1924-01-17 Today's Date: 02/10/2021    History of Present Illness Ms. Olivia Sanders is a 87 y.o. 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 MRI brain showed an acute right pontine infarct. Two days prior to arrival, she fell while taking out the trash and broke her right wrist.    PT Comments    Continuing work on functional mobility and activity tolerance;  Session focused on standing, getting weight bearing through LEs, and functional transfers; +2 heavy mod/max assist for transfers, with noting onset of fatigue as session progressed -- even with that, pt gave very good effort; Overall progressing well; Anticipate continuing good progress at post-acute rehabilitation.    Follow Up Recommendations  SNF;Supervision/Assistance - 24 hour;Other (comment)     Equipment Recommendations  Wheelchair (measurements PT);Wheelchair cushion (measurements PT);3in1 (PT);Other (comment) (perhaps drop-arm BSC; R platform for RW; can defer equipment recs to post-acute rehab -- must look into options for transportation to post-acute rehab in [place], [place])    Recommendations for Other Services       Precautions / Restrictions Precautions Precautions: Fall Precaution Comments: presume NWB on RUE due to cast from broken wrist, orders not in her chart    Mobility  Bed Mobility Overal bed mobility: Needs Assistance Bed Mobility: Supine to Sit     Supine to sit: Mod assist;Max assist     General bed mobility comments: Good initiation of movment with cues for technique, including semi-bridging for optimal bed pad placement; up to max assist to elevate trunk to sit     Transfers Overall transfer level: Needs assistance Equipment used: Right platform walker Transfers: Sit to/from Stand;Stand Pivot Transfers Sit to Stand: Mod assist;Max assist;+2 physical assistance;+2 safety/equipment Stand pivot transfers: Max assist       General transfer comment: Initial mod assist to stand from semi-elevated bed; Max assist to move RW and pivot to recliner; practiced standing from the recliner, and noted diffuculty getting hips fully extended for upright standing  Ambulation/Gait                 Stairs             Wheelchair Mobility    Modified Rankin (Stroke Patients Only)       Balance     Sitting balance-Leahy Scale: Fair       Standing balance-Leahy Scale: Poor                              Cognition Arousal/Alertness: Awake/alert Behavior During Therapy: WFL for tasks assessed/performed Overall Cognitive Status: Within Functional Limits for tasks assessed                                 General Comments: Smiles often      Exercises      General Comments        Pertinent Vitals/Pain Pain Assessment: No/denies pain    Home Living                      Prior Function            PT  Goals (current goals can now be found in the care plan section) Acute Rehab PT Goals Patient Stated Goal: to get back to normal PT Goal Formulation: With patient Time For Goal Achievement: 02/23/21 Potential to Achieve Goals: Good Progress towards PT goals: Progressing toward goals    Frequency    Min 4X/week      PT Plan Current plan remains appropriate    Co-evaluation              AM-PAC PT "6 Clicks" Mobility   Outcome Measure  Help needed turning from your back to your side while in a flat bed without using bedrails?: A Lot Help needed moving from lying on your back to sitting on the side of a flat bed without using bedrails?: A Lot Help needed moving to and from a bed to a  chair (including a wheelchair)?: A Lot Help needed standing up from a chair using your arms (e.g., wheelchair or bedside chair)?: A Lot Help needed to walk in hospital room?: Total Help needed climbing 3-5 steps with a railing? : Total 6 Click Score: 10    End of Session Equipment Utilized During Treatment: Gait belt Activity Tolerance: Patient tolerated treatment well Patient left: in chair;with call bell/phone within reach;with family/visitor present Nurse Communication: Mobility status PT Visit Diagnosis: Hemiplegia and hemiparesis Hemiplegia - Right/Left: Right Hemiplegia - dominant/non-dominant: Dominant Hemiplegia - caused by: Cerebral infarction     Time: 1133-1205 PT Time Calculation (min) (ACUTE ONLY): 32 min  Charges:  $Therapeutic Activity: 23-37 mins                     Roney Marion, PT  Acute Rehabilitation Services Pager [phone] Office [place] 02/10/2021, 2:27 PM

## 2021-02-10 NOTE — Progress Notes (Addendum)
Patient stated height is 5'1'' and weight approximately 160lbs.

## 2021-02-10 NOTE — Progress Notes (Signed)
Subjective:  Patient states that she continues to feel generalized weakness this morning. She says the weakness has not changed since yesterday. She also endorses a new dry cough over the last 2 days. Her daughter is present again this morning and says her mother has had phlegm, as well. Patient denies any sore throat, chest pain, or SOB. She has felt cold. She also denies any discomfort or difficulty urinating or having a bowel movement. She has been eating well. She is ready to be placed in a SNF and be closer to her home.  Objective:  Vital signs in last 24 hours: Vitals:   02/10/21 0354 02/10/21 0809 02/10/21 1159 02/10/21 1726  BP: (!) 151/75 137/76 (!) 176/67 (!) 152/77  Pulse: 99 96 87 89  Resp: 18 18 18 18   Temp: 99.7 F (37.6 C) 100 F (37.8 C) 97.6 F (36.4 C) 99.1 F (37.3 C)  TempSrc: Oral Oral Oral Oral  SpO2: 100% 100% 97% 96%   Weight change:   Intake/Output Summary (Last 24 hours) at 02/10/2021 1807 Last data filed at 02/10/2021 0650 Gross per 24 hour  Intake 120 ml  Output 400 ml  Net -280 ml   Physical Exam Constitutional:      Comments: Laying in bed slightly turned to the right. She is alert, conversant, and cooperative.  HENT:     Mouth/Throat:     Comments: Hard palate pink and not erythematous, no drainage noted; soft palate and oropharynx not visualized due to patient not opening mouth wide enough Cardiovascular:     Rate and Rhythm: Normal rate and regular rhythm.     Heart sounds: Normal heart sounds.  Pulmonary:     Effort: Pulmonary effort is normal.     Breath sounds: Normal breath sounds.  Abdominal:     General: Abdomen is flat. Bowel sounds are normal.     Palpations: Abdomen is soft.     Tenderness: There is no abdominal tenderness.  Feet:     Comments: Feet warm to touch. Toenails markedly dystrophic. Other findings stable from yesterday. Skin:    Comments: Upper and lower extremities under bed covers and warm, slightly diaphoretic.  Large bulla on right anteromedial lower leg appears less flaccid than yesterday. Decreased skin turgor over lower extremities bilaterally. Remainder of skin findings stable from yesterday.  Neurological:     Sensory: Sensation is intact.     Comments: Speech rarely slurred. Slight improvement of left-sided facial droop. Slightly better hearing on right side when rubbing fingers together. Left shoulder weaker than right. Strength 3/5 LUE and 4/5 RUE.  Psychiatric:        Attention and Perception: Attention and perception normal.        Mood and Affect: Mood and affect normal.        Behavior: Behavior normal. Behavior is cooperative.        Thought Content: Thought content normal.    Assessment/Plan:  Active Problems:   Stroke [facility]  Patient Summary: Olivia Sanders is a 87 y.o. female with a history of HTN, HLD, hypothyroidism, and undetermined seizure disorder who is hospital day 4 for evaluation of acute right pontine stroke.  1. Acute right pontine infarct Patient continues to have generalized weakness that has remained stable. Strength in upper extremities is stable. Facial droop appears to have improved slightly. Slurring of speech has improved markedly. Neurology signed off, agreeing with plan to discharge to SNF for rehabilitation with outpatient neurology follow up. PT notes good continuing  progress. TOC working on admitting patient to [facility]. COVID test pending. -Continue following SNF placement with TOC -Continue atorvastatin 40 mg daily and DAPT daily with discontinuation of plavix 75 mg after 3 weeks -Monitor vitals, serial neuro exams  2. Increased temperature Patient's temperature has remained stable around 99.1-99.13F throughout admission. Upon admission, temperature was 98.70F. Admission urinalysis demonstrated small leukocytes and rare bacteria. Patient denies any suprapubic pain, dysuria, or polyuria. She does endorse a recent dry cough of 2 days with phlegm. Hard  palate is not erythematous. She does not have a sore throat, chest pain, or SOB. She does feel warm to touch and is slightly diaphoretic on her extremities under the bed covers. Room temperature is also warm. Breath sounds normal in all lung fields bilaterally. Given risk of increased temperature in setting of new dry cough, need to rule out infectious source. -CXR to assess respiratory pathology based on cough -Urinalysis to assess UTI based on admission findings in setting of increased temperature since admission  3. Hypertension Patient's BP today 152/77. Within range of permissive hypertension. -Hold homeamlodipine 10 mg, furosemide 20 mg, clonidine 0.1 mg, and losartan 50 mg daily  4. Hypothyroidism Patient's TSH is 3.204. She continues to feel cold and fatigued but expect this is due to inactivity and acute recovery period from stroke. -Continue home synthroid 25 mcg daily for now  5. Seizures Patient has continued to not demonstrate any seizure-like activity. -Continue keppra 500 mg twice daily  6. GERD Patient has not experienced any dysphagia, reflux, or burning in chest after meals since admission. -Continue home pantoprazole 40 mg daily  7. Glaucoma Patient's glaucoma is currently adequately treated. 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 Hyperpigmented, annular plaques, along with multiple small vesicles bilaterally, are still present. -Continue clobetasol 0.05% topically to legs twice daily   LOS: 2 days   Mikal Plane, Medical Student 02/10/2021, 6:07 PM Pager: [phone] After 5pm on weekdays and 1pm on weekends: On Call pager [phone]

## 2021-02-10 NOTE — TOC Progression Note (Signed)
Transition of Care [facility]) - Progression Note    Patient Details  Name: Olivia Sanders MRN: [id number] Date of Birth: [birth year]  Transition of Care [facility]) CM/SW [place], [place] Phone Number: 02/10/2021, 4:23 PM  Clinical Narrative:     CSW received call from  [facility] and [place] - they confirmed they have availability- they have requested patient's WT/HT and covid test. Will fax d/c summary , covid test results and weight/height information to [phone] when medically stable for d/c.   Family states they will transport the patient to rehab.  RN updated-covid test requested   Thurmond Butts, MSW, LCSW Clinical Social Worker   Expected Discharge Plan: Skilled Nursing Facility Barriers to Discharge: Continued Medical Work up,SNF Pending bed offer  Expected Discharge Plan and Services Expected Discharge Plan: [place] In-house Referral: Clinical Social Work     Living arrangements for the past 2 months: Single Family Home                                       Social Determinants of Health (SDOH) Interventions    Readmission Risk Interventions No flowsheet data found.

## 2021-02-10 NOTE — TOC Progression Note (Signed)
Transition of Care [facility]) - Progression Note    Patient Details  Name: Olivia Sanders MRN: [id number] Date of Birth: 18-Feb-1924  Transition of Care [facility]) CM/SW [place], [place] Phone Number: 02/10/2021, 2:54 PM  Clinical Narrative:     2:47pm-CSW contacted Admission office # [phone] for [place] and rehab- to follow on referral - CSW was informed clinicals were received and being reviewed- SNF will call CSW back   1:56pm-Received call from patient's daughter,Gayle- she inquired about the status of SNF placement   Expected Discharge Plan: [place] Barriers to Discharge: Continued Medical Work up,SNF Pending bed offer  Expected Discharge Plan and Services Expected Discharge Plan: [place] In-house Referral: Clinical Social Work     Living arrangements for the past 2 months: Single Family Home                                       Social Determinants of Health (SDOH) Interventions    Readmission Risk Interventions No flowsheet data found.

## 2021-02-10 NOTE — TOC Progression Note (Signed)
Transition of Care [facility]) - Progression Note    Patient Details  Name: Olivia Sanders MRN: [id number] Date of Birth: Jan 01, 1924  Transition of Care [facility]) CM/SW [place], [place] Phone Number: 02/10/2021, 4:43 PM  Clinical Narrative:    CSW provided height and weight info to [place] in admissions at [facility]. Md ordered COVID test.    Expected Discharge Plan: [place] Barriers to Discharge: Continued Medical Work up,SNF Pending bed offer  Expected Discharge Plan and Services Expected Discharge Plan: [place] In-house Referral: Clinical Social Work     Living arrangements for the past 2 months: Single Family Home                                       Social Determinants of Health (SDOH) Interventions    Readmission Risk Interventions No flowsheet data found.

## 2021-02-11 DIAGNOSIS — K219 Gastro-esophageal reflux disease without esophagitis: Secondary | ICD-10-CM | POA: Insufficient documentation

## 2021-02-11 DIAGNOSIS — R531 Weakness: Secondary | ICD-10-CM | POA: Insufficient documentation

## 2021-02-11 DIAGNOSIS — C8400 Mycosis fungoides, unspecified site: Secondary | ICD-10-CM | POA: Insufficient documentation

## 2021-02-11 DIAGNOSIS — E039 Hypothyroidism, unspecified: Secondary | ICD-10-CM | POA: Insufficient documentation

## 2021-02-11 DIAGNOSIS — I69391 Dysphagia following cerebral infarction: Secondary | ICD-10-CM | POA: Insufficient documentation

## 2021-02-11 DIAGNOSIS — I69322 Dysarthria following cerebral infarction: Secondary | ICD-10-CM | POA: Insufficient documentation

## 2021-02-11 DIAGNOSIS — I69354 Hemiplegia and hemiparesis following cerebral infarction affecting left non-dominant side: Secondary | ICD-10-CM | POA: Insufficient documentation

## 2021-02-11 DIAGNOSIS — G8194 Hemiplegia, unspecified affecting left nondominant side: Secondary | ICD-10-CM | POA: Insufficient documentation

## 2021-02-11 DIAGNOSIS — H9190 Unspecified hearing loss, unspecified ear: Secondary | ICD-10-CM | POA: Insufficient documentation

## 2021-02-11 DIAGNOSIS — H409 Unspecified glaucoma: Secondary | ICD-10-CM | POA: Insufficient documentation

## 2021-02-11 DIAGNOSIS — R2689 Other abnormalities of gait and mobility: Secondary | ICD-10-CM | POA: Insufficient documentation

## 2021-02-11 DIAGNOSIS — I639 Cerebral infarction, unspecified: Secondary | ICD-10-CM | POA: Insufficient documentation

## 2021-02-11 DIAGNOSIS — Z96642 Presence of left artificial hip joint: Secondary | ICD-10-CM | POA: Insufficient documentation

## 2021-02-11 DIAGNOSIS — M6281 Muscle weakness (generalized): Secondary | ICD-10-CM | POA: Insufficient documentation

## 2021-02-11 DIAGNOSIS — R32 Unspecified urinary incontinence: Secondary | ICD-10-CM | POA: Insufficient documentation

## 2021-02-11 LAB — URINALYSIS, ROUTINE W REFLEX MICROSCOPIC
Bilirubin Urine: NEGATIVE
Glucose, UA: NEGATIVE mg/dL
Hgb urine dipstick: NEGATIVE
Ketones, ur: NEGATIVE mg/dL
Nitrite: POSITIVE — AB
Protein, ur: 30 mg/dL — AB
Specific Gravity, Urine: 1.026 (ref 1.005–1.030)
pH: 6 (ref 5.0–8.0)

## 2021-02-11 LAB — SARS CORONAVIRUS 2 (TAT 6-24 HRS): SARS Coronavirus 2: NEGATIVE

## 2021-02-11 MED ORDER — AMLODIPINE BESYLATE 5 MG PO TABS: 5.0000 mg | ORAL_TABLET | Freq: Every day | ORAL | 11 refills | Status: AC

## 2021-02-11 MED ORDER — CLOPIDOGREL BISULFATE 75 MG PO TABS: 75.0000 mg | ORAL_TABLET | Freq: Every day | ORAL | 0 refills | Status: AC

## 2021-02-11 MED ORDER — CIPROFLOXACIN HCL 500 MG PO TABS
250.0000 mg | ORAL_TABLET | ORAL | Status: DC
  Administered 2021-02-11: 250 mg via ORAL
  Filled 2021-02-11: qty 1

## 2021-02-11 MED ORDER — ATORVASTATIN CALCIUM 40 MG PO TABS: 40.0000 mg | ORAL_TABLET | Freq: Every day | ORAL | 0 refills | Status: AC

## 2021-02-11 MED ORDER — CIPROFLOXACIN HCL 250 MG PO TABS: 250.0000 mg | ORAL_TABLET | Freq: Two times a day (BID) | ORAL | 0 refills | Status: AC

## 2021-02-11 MED ORDER — ASPIRIN 81 MG PO TBEC: 81.0000 mg | DELAYED_RELEASE_TABLET | Freq: Every day | ORAL | 11 refills | Status: AC

## 2021-02-11 NOTE — Discharge Instructions (Signed)
Ms. Olivia Sanders, it was such a pleasure meeting you and caring for you. You were admitted to the hospital after having a stroke in a part of your brain called the pons. You experienced weakness over your stay on the left side of your body, but it is improving with all of your hard work. While here, we also discovered you have a urinary tract infection. We have started you on a medication called ciprofloxacin to take for this infection. You are now going to a rehabilitation center closer to your home in [place] so that you can continue to regain your strength. Here are your discharge instructions: 1. Your rehabilitation facility will help you regain your strength. 2. Continue to take your ciprofloxacin 250 mg tablets twice a day for 5 days. You received one tablet here in the hospital, so take one more tonight. Then take two tablets for another 4 days. 3. Start taking one of your blood pressure (BP) medications, amlodipine 5 mg, once per day. Do not take your furosemide, clonidine, or losartan. We will increase the amlodipine dose as we see your BP response. 4. Continue taking your atorvastatin 40 mg once a day, aspirin 81 mg once a day, and clopidogrel 75 mg once per day. Take your last dose of clopidogrel on June 4th. After that, just continue your aspirin once per day only. 5. 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    Hospital Discharge After a Stroke  Being discharged from the hospital after a stroke can feel overwhelming. Many things may be different. It is normal to feel scared or anxious. Some stroke survivors may be able to return to their homes. Others may need more specialized care on a temporary or permanent basis. Your stroke care team will work with you to develop a discharge plan that is best for you. Ask questions if you  do not understand something. Invite a friend or family member to participate in discharge planning. It is important to understand and follow your discharge plan to help prevent another stroke or other problems. General recommendations  Ask a friend or family member to get needed things in place before you go home if possible. A therapist can come to your home to make recommendations for safety equipment. Ask your health care provider if you would benefit from this service or from home care.  Take steps to prevent falls, such as: ? Installing grab bars in the shower or using a shower chair. ? Install grab bars by the toilet. ? Removing tripping hazards, such as area rugs or cords.   Supplies needed Ask your health care provider for a list of medical equipment and supplies you will need at home. These may include:  Walkers.  Canes.  Wheelchairs.  Hand-strengthening devices.  Special eating utensils. Medical equipment can be rented or purchased, depending on your insurance coverage. Check with your insurance company about what is covered. Follow these instructions at home: Medicines  Take all medicines exactly as told by your health care provider to prevent serious harm, such as another stroke. Make sure you understand: ? What medicine to take. ? Why you are taking the medicine. ? How and when to take it. ? If it can be taken with your other medicines and herbal supplements. ? Possible side effects, and when to call your health care provider if you have  side effects. ? How you will get and pay for your medicines. Medical assistance programs may be able to help you pay for prescription medicines if you cannot afford them.  If you are taking an anticoagulant: ? Be sure to take it exactly as told by your health care provider. This medicine can increase the risk of bleeding because it works to prevent blood clots. You may need to take certain precautions to prevent bleeding. ? Do not take  medicines that contain aspirin or NSAIDs, such as ibuprofen, unless your health care provider approves. These medicines increase your risk for dangerous bleeding. Preventing another stroke Having a stroke puts you at risk for another stroke in the future. Ask your health care provider what actions you can take to lower the risk. These may include:  Increasing how much you exercise.  Making a healthy eating plan.  Quitting smoking.  Managing other health conditions, such as high blood pressure, high cholesterol, or diabetes.  Limiting alcohol use. General instructions  Before you leave the hospital, you will be given information about stroke warning signs. Share these with your friends and family members.  You will need to follow up regularly with a health care provider. You may need rehabilitation, such as physical therapy, occupational therapy, and speech-language therapy. Keeping these appointments is very important to your recovery.  Be sure to bring your medicine list and discharge papers to your appointments. Use a calendar or appointment reminder if you need help to keep track of your schedule. Contact a health care provider if:  You are taking an anticoagulant, and you have one or more of these problems: ? Bleeding or bruising. ? A fall or other injury to your head. ? Blood in your urine or stool (feces). Get help right away if you:  Have any symptoms of a stroke. "BE FAST" is an easy way to remember the main warning signs of a stroke: ? B - Balance. Signs are dizziness, sudden trouble walking, or loss of balance. ? E - Eyes. Signs are trouble seeing or a sudden change in vision. ? F - Face. Signs are sudden weakness or numbness of the face, or the face or eyelid drooping on one side. ? A - Arms. Signs are weakness or numbness in an arm. This happens suddenly and usually on one side of the body. ? S - Speech. Signs are sudden trouble speaking, slurred speech, or trouble  understanding what people say. ? T - Time. Time to call emergency services. Write down what time symptoms started. Your emergency responders will need to know this information.  Have other signs of a stroke, such as: ? A sudden, severe headache with no known cause. ? Nausea or vomiting. ? Seizure. These symptoms may represent a serious problem that is an emergency. Do not wait to see if the symptoms will go away. Get medical help right away. Call your local emergency services (911 in the [place] Do not drive yourself to the hospital.   Summary  Being discharged from the hospital after a stroke can feel overwhelming. It is normal to feel scared or anxious.  Make sure you take medicines exactly as told by your health care provider.  Know the warning signs of a stroke. Before you leave the hospital, you will be given information about stroke warning signs. Share these with your friends and family members.  Get help right way if you have any symptoms of a stroke. "BE FAST" is an easy way to remember  the main warning signs of a stroke. This information is not intended to replace advice given to you by your health care provider. Make sure you discuss any questions you have with your health care provider. Document Revised: 04/30/2020 Document Reviewed: 04/30/2020 Elsevier Patient Education  2021 [employer].

## 2021-02-11 NOTE — Care Management Important Message (Signed)
Important Message  Patient Details  Name: Olivia Sanders MRN: [id number] Date of Birth: [birth year]   Medicare Important Message Given:  Yes     Launa Goedken Montine Circle 02/11/2021, 2:47 PM

## 2021-02-11 NOTE — Progress Notes (Signed)
Physical Therapy Treatment Patient Details Name: Olivia Sanders MRN: [id number] DOB: 03/17/1924 Today's Date: 02/11/2021    History of Present Illness Olivia Sanders is a 87 y.o. female with history of  HTN, HLD, and hypothyroidism visiting [place] from [place], [place] for her Granddaughter's graduation  presenting with weakness, dysphagia, dysarthria, and left sided facial droop, and LLE weakness found to have an acute wedge infarction of the right pons. MRI brain showed an acute right pontine infarct. Two days prior to arrival, she fell while taking out the trash and broke her right wrist.    PT Comments    Progressing steadily toward goals.  Pt d/cing today and emphasis on transfers to transport chair and to car for self transport to [place].   Follow Up Recommendations  SNF;Supervision/Assistance - 24 hour;Other (comment)     Equipment Recommendations  Wheelchair (measurements PT);Wheelchair cushion (measurements PT);3in1 (PT);Other (comment)    Recommendations for Other Services       Precautions / Restrictions      Mobility  Bed Mobility Overal bed mobility: Needs Assistance Bed Mobility: Supine to Sit Rolling: Max assist   Supine to sit: Mod assist;Max assist     General bed mobility comments: cues for direction/sequencing, truncal assist for rolling and up via L elbow, cues for pt to utilize L UE for her own assist while giving pt time to move it synergistically.    Transfers Overall transfer level: Needs assistance   Transfers: Sit to/from Stand;Stand Pivot Transfers Sit to Stand: Mod assist;Max assist;+2 safety/equipment Stand pivot transfers: Mod assist;Max assist;+2 safety/equipment       General transfer comment: face to face assist from bed to transport chair and transport chair into her niece's car for transport to [place].  Positioning in the car more difficult than the transfer due to pt still unable to w/shift well or push effectively with LE's  to shift position.  Ambulation/Gait                 Stairs             Wheelchair Mobility    Modified Rankin (Stroke Patients Only) Modified Rankin (Stroke Patients Only) Modified Rankin: Severe disability     Balance     Sitting balance-Leahy Scale: Fair       Standing balance-Leahy Scale: Poor Standing balance comment: reliant on external support                            Cognition Arousal/Alertness: Awake/alert Behavior During Therapy: WFL for tasks assessed/performed Overall Cognitive Status: Within Functional Limits for tasks assessed                                        Exercises Other Exercises Other Exercises: AA/AROM for warm up of UE's and LE's prior to mobility due to stiffness.    General Comments General comments (skin integrity, edema, etc.): vss overall on RA      Pertinent Vitals/Pain Pain Assessment: Faces Faces Pain Scale: No hurt Pain Intervention(s): Monitored during session    Home Living                      Prior Function            PT Goals (current goals can now be found in the care plan section) Acute Rehab  PT Goals Patient Stated Goal: to get back to normal PT Goal Formulation: With patient Time For Goal Achievement: 02/23/21 Potential to Achieve Goals: Good Progress towards PT goals: Progressing toward goals    Frequency    Min 4X/week      PT Plan Current plan remains appropriate    Co-evaluation              AM-PAC PT "6 Clicks" Mobility   Outcome Measure  Help needed turning from your back to your side while in a flat bed without using bedrails?: A Lot Help needed moving from lying on your back to sitting on the side of a flat bed without using bedrails?: A Lot Help needed moving to and from a bed to a chair (including a wheelchair)?: A Lot Help needed standing up from a chair using your arms (e.g., wheelchair or bedside chair)?: A Lot Help needed to  walk in hospital room?: Total Help needed climbing 3-5 steps with a railing? : Total 6 Click Score: 10    End of Session   Activity Tolerance: Patient tolerated treatment well Patient left: Other (comment) (in car ready for transport by her family.) Nurse Communication: Mobility status PT Visit Diagnosis: Hemiplegia and hemiparesis Hemiplegia - Right/Left: Right Hemiplegia - dominant/non-dominant: Dominant Hemiplegia - caused by: Cerebral infarction     Time: 1259-1318 PT Time Calculation (min) (ACUTE ONLY): 19 min  Charges:  $Therapeutic Activity: 8-22 mins                     02/11/2021  Olivia Carne., PT Acute Rehabilitation Services [phone]  (pager) [phone]  (office)   Olivia Sanders 02/11/2021, 1:57 PM

## 2021-02-11 NOTE — Discharge Summary (Signed)
Name: Olivia Sanders MRN: [id number] DOB: 01/31/1924 87 y.o. PCP: Pcp, No  Date of Admission: 02/07/2021  7:49 AM Date of Discharge: 02/11/2021 Attending Physician: Angelica Pou, MD  Discharge Diagnosis: 1. Acute right pontine stroke 2. Urinary tract infection 3. Hypertension  Discharge Medications: Allergies as of 02/11/2021      Reactions   Penicillins    Childhood reaction      Medication List    STOP taking these medications   cloNIDine 0.1 MG tablet Commonly known as: CATAPRES   furosemide 20 MG tablet Commonly known as: LASIX   losartan 50 MG tablet Commonly known as: COZAAR     TAKE these medications   amLODipine 5 MG tablet Commonly known as: NORVASC Take 1 tablet (5 mg total) by mouth daily. What changed:   medication strength  how much to take   aspirin 81 MG EC tablet Take 1 tablet (81 mg total) by mouth daily. Swallow whole. Start taking on: Feb 12, 2021   atorvastatin 40 MG tablet Commonly known as: LIPITOR Take 1 tablet (40 mg total) by mouth daily. Start taking on: Feb 12, 2021   ciprofloxacin 250 MG tablet Commonly known as: CIPRO Take 1 tablet (250 mg total) by mouth 2 (two) times daily for 5 days. Start taking on: Feb 12, 2021   clobetasol ointment 0.05 % Commonly known as: TEMOVATE Apply 1 application topically 2 (two) times daily.   clopidogrel 75 MG tablet Commonly known as: PLAVIX Take 1 tablet (75 mg total) by mouth daily for 16 days. Start taking on: Feb 12, 2021   latanoprost 0.005 % ophthalmic solution Commonly known as: XALATAN Place 1 drop into both eyes at bedtime.   levETIRAcetam 500 MG tablet Commonly known as: KEPPRA Take 500 mg by mouth 2 (two) times daily.   levothyroxine 25 MCG tablet Commonly known as: SYNTHROID Take 25 mcg by mouth daily before breakfast.   pantoprazole 40 MG tablet Commonly known as: PROTONIX Take 40 mg by mouth daily.      Disposition and follow-up:   [name]  was discharged from [facility] in Stable condition.  At the hospital follow up visit please address:  1.  Stroke: strength, weakness; UTI: resolved symptoms, UA  2.  Labs / imaging needed at time of follow-up: UA  3.  Pending labs/ test needing follow-up: None  Follow-up Appointments:  Follow-up Information    local neurologist. Schedule an appointment as soon as possible for a visit in 4 week(s).              Hospital Course by problem list:  1. Acute right pontine stroke Patient was admitted after having left-sided weakness, left-sided facial drooping, and slurred speech after a fall 2 days prior. Vitals, head CT unremarkable. MRI demonstrated acute right pontine infarct. MRA demonstrated stenosis of L posterior communicating artery, left vertebral artery, right PCA, left PCA, and left ACA. Patient had waxing and waning strength and slurred speech over course of stay with marked improvement last two days before discharge. PT and OT consulted and noted good progress and recommended SNF placement. Neurology signed off, agreed with PT/OT, and recommended outpatient neurology follow up. Patient and family amenable for further rehabilitation at [facility] closer to her home.  2. Urinary tract infection Patient has temperature of 98.37F on arrival to ED. UA on admission demonstrated small leukocytes and rare bacteria. Temperature elevated to 100 on 5/17. Patient noted a dry cough and  phlegm at this time. Breath sounds were normal, no chest pain, SOB. CXR was normal. UA collected and was positive for nitrites, large leukocytes, and many bacteria. Started on oral ciprofloxacin 250 mg bid. She received one 250 mg tablet here before discharge.  3. Hypertension Patient's BP remained around 150s/60-70s on average throughout her stay. Held all BP medications to allow for permissive hypertension in setting of acute stroke. Started amlodipine 5 mg and  will up titrate depending on BP response. Held all other medications.  Progress Note 02/11/2021 Ms. Olivia Sanders is smiling today and states she is ready to leave. She still feels weak, but she feels as if she is improving. She continues to have no discomfort on urination, and she has not noticed any frequency or feelings of not emptying her bladder. She had a "good" bowel movement this morning and is eating well. Her daughter is at the bedside smiling as well, eager to show the team her mother's improvement.   Discharge Exam:   BP (!) 147/73 (BP Location: Left Arm)   Pulse 88   Temp 99.7 F (37.6 C) (Oral)   Resp 17   Ht 5\' 1"  (1.549 m)   Wt 61.2 kg   SpO2 96%   BMI 25.51 kg/m   Physical Exam HENT:     Mouth/Throat:     Comments: Hard palate is not erythematous, and no drainage is appreciated. White film on tongue. Eyes:     Extraocular Movements: Extraocular movements intact.  Cardiovascular:     Rate and Rhythm: Normal rate and regular rhythm.     Heart sounds: Normal heart sounds.  Pulmonary:     Effort: Pulmonary effort is normal.     Breath sounds: Normal breath sounds.  Abdominal:     General: Abdomen is flat. Bowel sounds are normal. There is no distension.     Palpations: Abdomen is soft.     Tenderness: There is no abdominal tenderness.  Feet:     Comments: Calluses overlying lateral calcaneous bilaterally. Marked dystrophy of all toenails. Skin:    General: Skin is warm and dry.     Comments: Large bulla on anterior right lower leg appears to have resorbed. Clobetasol has recently been applied. Remainder of findings stable from yesterday.  Neurological:     Mental Status: She is alert and oriented to person, place, and time.     Sensory: Sensation is intact.     Comments: Very mild left-sided facial droop. Hearing is slightly diminished on left as compared to right. Left shoulder weaker than right. Other CN intact. Speech is not slurred. Strength in LUE and RUE is 4/5.  Strength in LLE is 3/5 and RLE is 4/5. Left dorsiflexion is 1/5. Right dorsiflexion is 4/5.  Psychiatric:        Attention and Perception: Attention and perception normal.        Mood and Affect: Mood and affect normal.        Behavior: Behavior normal. Behavior is cooperative.        Thought Content: Thought content normal.        Cognition and Memory: Memory normal.    Pertinent Labs, Studies, and Procedures:   Recent Results (from the past 2160 hour(s))  Ethanol     Status: None   Collection Time: 02/07/21  8:31 AM  Result Value Ref Range   Alcohol, Ethyl (B) <10 <10 mg/dL    Comment: (NOTE) Lowest detectable limit for serum alcohol is 10 mg/dL.  For  medical purposes only. Performed at [facility], [place] [address], [place], [place] [address]   Protime-INR     Status: None   Collection Time: 02/07/21  8:31 AM  Result Value Ref Range   Prothrombin Time 13.1 11.4 - 15.2 seconds   INR 1.0 0.8 - 1.2    Comment: (NOTE) INR goal varies based on device and disease states. Performed at [facility], [place] [address] [place], [place] [address]   APTT     Status: None   Collection Time: 02/07/21  8:31 AM  Result Value Ref Range   aPTT 29 24 - 36 seconds    Comment: Performed at [facility] [address] [place], [place] [address]  CBC     Status: Abnormal   Collection Time: 02/07/21  8:31 AM  Result Value Ref Range   WBC 4.9 4.0 - 10.5 K/uL   RBC 4.22 3.87 - 5.11 MIL/uL   Hemoglobin 11.1 (L) 12.0 - 15.0 g/dL   HCT 34.8 (L) 36.0 - 46.0 %   MCV 82.5 80.0 - 100.0 fL   MCH 26.3 26.0 - 34.0 pg   MCHC 31.9 30.0 - 36.0 g/dL   RDW 13.8 11.5 - 15.5 %   Platelets 263 150 - 400 K/uL   nRBC 0.0 0.0 - 0.2 %    Comment: Performed at [facility], [place] [address] [place], [place] [address]  Differential     Status: None   Collection Time: 02/07/21  8:31 AM  Result Value Ref Range   Neutrophils Relative % 72 %   Neutro Abs 3.5 1.7 - 7.7 K/uL   Lymphocytes  Relative 15 %   Lymphs Abs 0.7 0.7 - 4.0 K/uL   Monocytes Relative 11 %   Monocytes Absolute 0.5 0.1 - 1.0 K/uL   Eosinophils Relative 1 %   Eosinophils Absolute 0.1 0.0 - 0.5 K/uL   Basophils Relative 1 %   Basophils Absolute 0.0 0.0 - 0.1 K/uL   Immature Granulocytes 0 %   Abs Immature Granulocytes 0.02 0.00 - 0.07 K/uL    Comment: Performed at [facility] [address] [place], [place] [address]  Comprehensive metabolic panel     Status: Abnormal   Collection Time: 02/07/21  8:31 AM  Result Value Ref Range   Sodium 138 135 - 145 mmol/L   Potassium 3.9 3.5 - 5.1 mmol/L   Chloride 101 98 - 111 mmol/L   CO2 27 22 - 32 mmol/L   Glucose, Bld 117 (H) 70 - 99 mg/dL    Comment: Glucose reference range applies only to samples taken after fasting for at least 8 hours.   BUN 15 8 - 23 mg/dL   Creatinine, Ser 1.01 (H) 0.44 - 1.00 mg/dL   Calcium 9.0 8.9 - 10.3 mg/dL   Total Protein 7.0 6.5 - 8.1 g/dL   Albumin 3.3 (L) 3.5 - 5.0 g/dL   AST 30 15 - 41 U/L   ALT 20 0 - 44 U/L   Alkaline Phosphatase 62 38 - 126 U/L   Total Bilirubin 0.7 0.3 - 1.2 mg/dL   GFR, Estimated 52 (L) >60 mL/min    Comment: (NOTE) Calculated using the CKD-EPI Creatinine Equation (2021)    Anion gap 10 5 - 15    Comment: Performed at [facility] [address], [place], [place] [address]  I-stat chem 8, ED     Status: Abnormal  Collection Time: 02/07/21  8:47 AM  Result Value Ref Range   Sodium 140 135 - 145 mmol/L   Potassium 3.9 3.5 - 5.1 mmol/L   Chloride 102 98 - 111 mmol/L   BUN 20 8 - 23 mg/dL   Creatinine, Ser 1.00 0.44 - 1.00 mg/dL   Glucose, Bld 116 (H) 70 - 99 mg/dL    Comment: Glucose reference range applies only to samples taken after fasting for at least 8 hours.   Calcium, Ion 1.11 (L) 1.15 - 1.40 mmol/L   TCO2 32 22 - 32 mmol/L   Hemoglobin 10.9 (L) 12.0 - 15.0 g/dL   HCT 32.0 (L) 36.0 - 46.0 %  Urine rapid drug screen (hosp performed)     Status: None   Collection Time:  02/07/21  1:00 PM  Result Value Ref Range   Opiates NONE DETECTED NONE DETECTED   Cocaine NONE DETECTED NONE DETECTED   Benzodiazepines NONE DETECTED NONE DETECTED   Amphetamines NONE DETECTED NONE DETECTED   Tetrahydrocannabinol NONE DETECTED NONE DETECTED   Barbiturates NONE DETECTED NONE DETECTED    Comment: (NOTE) DRUG SCREEN FOR MEDICAL PURPOSES ONLY.  IF CONFIRMATION IS NEEDED FOR ANY PURPOSE, NOTIFY LAB WITHIN 5 DAYS.  LOWEST DETECTABLE LIMITS FOR URINE DRUG SCREEN Drug Class                     Cutoff (ng/mL) Amphetamine and metabolites    1000 Barbiturate and metabolites    200 Benzodiazepine                 [id number] Tricyclics and metabolites     300 Opiates and metabolites        300 Cocaine and metabolites        300 THC                            50 Performed at [facility], [place] [address] [place], [place] [address]   Urinalysis, Routine w reflex microscopic Urine, Clean Catch     Status: Abnormal   Collection Time: 02/07/21  1:00 PM  Result Value Ref Range   Color, Urine YELLOW YELLOW   APPearance HAZY (A) CLEAR   Specific Gravity, Urine 1.013 1.005 - 1.030   pH 7.0 5.0 - 8.0   Glucose, UA NEGATIVE NEGATIVE mg/dL   Hgb urine dipstick NEGATIVE NEGATIVE   Bilirubin Urine NEGATIVE NEGATIVE   Ketones, ur NEGATIVE NEGATIVE mg/dL   Protein, ur NEGATIVE NEGATIVE mg/dL   Nitrite NEGATIVE NEGATIVE   Leukocytes,Ua SMALL (A) NEGATIVE   RBC / HPF 0-5 0 - 5 RBC/hpf   WBC, UA 0-5 0 - 5 WBC/hpf   Bacteria, UA RARE (A) NONE SEEN   Squamous Epithelial / LPF 0-5 0 - 5    Comment: Performed at [facility], [place] [address] [place], [place] [address]  Lipid panel     Status: Abnormal   Collection Time: 02/07/21  1:59 PM  Result Value Ref Range   Cholesterol 202 (H) 0 - 200 mg/dL   Triglycerides 105 <150 mg/dL   HDL 43 >40 mg/dL   Total CHOL/HDL Ratio 4.7 RATIO   VLDL 21 0 - 40 mg/dL   LDL Cholesterol 138 (H) 0 - 99 mg/dL    Comment:        Total  Cholesterol/HDL:CHD Risk Coronary Heart Disease Risk Table  Men   Women  1/2 Average Risk   3.4   3.3  Average Risk       5.0   4.4  2 X Average Risk   9.6   7.1  3 X Average Risk  23.4   11.0        Use the calculated Patient Ratio above and the CHD Risk Table to determine the patient's CHD Risk.        ATP III CLASSIFICATION (LDL):  <100     mg/dL   Optimal  100-129  mg/dL   Near or Above                    Optimal  130-159  mg/dL   Borderline  160-189  mg/dL   High  >190     mg/dL   Very High Performed at [facility] [address], [place], [place] [address]   Hemoglobin A1c     Status: Abnormal   Collection Time: 02/07/21  1:59 PM  Result Value Ref Range   Hgb A1c MFr Bld 6.2 (H) 4.8 - 5.6 %    Comment: (NOTE) Pre diabetes:          5.7%-6.4%  Diabetes:              >6.4%  Glycemic control for   <7.0% adults with diabetes    Mean Plasma Glucose 131.24 mg/dL    Comment: Performed at [facility] [address], [place], [place] [address]  ECHOCARDIOGRAM COMPLETE BUBBLE STUDY     Status: None   Collection Time: 02/08/21  9:50 AM  Result Value Ref Range   S' Lateral 2.90 cm   AR max vel 1.53 cm2   AV Area VTI 1.81 cm2   AV Mean grad 12.0 mmHg   AV Peak grad 22.3 mmHg   Ao pk vel 2.36 m/s   Area-P 1/2 4.44 cm2   AV Area mean vel 1.57 cm2  Basic metabolic panel     Status: Abnormal   Collection Time: 02/09/21  3:41 AM  Result Value Ref Range   Sodium 139 135 - 145 mmol/L   Potassium 3.8 3.5 - 5.1 mmol/L   Chloride 105 98 - 111 mmol/L   CO2 26 22 - 32 mmol/L   Glucose, Bld 110 (H) 70 - 99 mg/dL    Comment: Glucose reference range applies only to samples taken after fasting for at least 8 hours.   BUN 15 8 - 23 mg/dL   Creatinine, Ser 1.02 (H) 0.44 - 1.00 mg/dL   Calcium 9.3 8.9 - 10.3 mg/dL   GFR, Estimated 51 (L) >60 mL/min    Comment: (NOTE) Calculated using the CKD-EPI Creatinine Equation (2021)    Anion gap 8 5 - 15     Comment: Performed at [facility] [address], [place], [place] [address]  CBC     Status: Abnormal   Collection Time: 02/09/21  3:41 AM  Result Value Ref Range   WBC 6.2 4.0 - 10.5 K/uL   RBC 4.28 3.87 - 5.11 MIL/uL   Hemoglobin 11.3 (L) 12.0 - 15.0 g/dL   HCT 35.5 (L) 36.0 - 46.0 %   MCV 82.9 80.0 - 100.0 fL   MCH 26.4 26.0 - 34.0 pg   MCHC 31.8 30.0 - 36.0 g/dL   RDW 13.6 11.5 - 15.5 %   Platelets 276 150 - 400 K/uL   nRBC 0.0 0.0 -  0.2 %    Comment: Performed at [facility], [place] [address] [place], [place] [address]  SARS CORONAVIRUS 2 (TAT 6-24 HRS) Nasopharyngeal Nasopharyngeal Swab     Status: None   Collection Time: 02/10/21  4:43 PM   Specimen: Nasopharyngeal Swab  Result Value Ref Range   SARS Coronavirus 2 NEGATIVE NEGATIVE  Urinalysis, Routine w reflex microscopic Urine, Clean Catch     Status: Abnormal   Collection Time: 02/11/21 12:29 AM  Result Value Ref Range   Color, Urine AMBER (A) YELLOW    Comment: BIOCHEMICALS MAY BE AFFECTED BY COLOR   APPearance CLOUDY (A) CLEAR   Specific Gravity, Urine 1.026 1.005 - 1.030   pH 6.0 5.0 - 8.0   Glucose, UA NEGATIVE NEGATIVE mg/dL   Hgb urine dipstick NEGATIVE NEGATIVE   Bilirubin Urine NEGATIVE NEGATIVE   Ketones, ur NEGATIVE NEGATIVE mg/dL   Protein, ur 30 (A) NEGATIVE mg/dL   Nitrite POSITIVE (A) NEGATIVE   Leukocytes,Ua LARGE (A) NEGATIVE   RBC / HPF 6-10 0 - 5 RBC/hpf   WBC, UA 21-50 0 - 5 WBC/hpf   Bacteria, UA MANY (A) NONE SEEN   Squamous Epithelial / LPF 6-10 0 - 5   Amorphous Crystal PRESENT    Non Squamous Epithelial 0-5 (A) NONE SEEN    Comment: Performed at [facility], [address], [place], [place] [address]   CT Head IMPRESSION: 1. No acute finding. 2. Generalized atrophy and chronic small vessel ischemia  MR Brain WO Contrast IMPRESSION: Acute right pontine infarct.  MR Angio WO Contrast IMPRESSION: 1. Intracranial atherosclerotic disease, as outlined and  with findings most notably as follows. 2. Partially occluded left posterior communicating artery. 3. Sites of up to moderate stenosis within the V4 left vertebral artery. 4. Moderate stenosis within the right PCA at the P1/P2 junction. 5. Moderate stenosis within the P3 right posterior cerebral artery. 6. Mild/moderate stenoses within the P2 and P3 segments of the left posterior cerebral artery. 7. Sites of moderate/severe stenosis within the A2/A3 left anterior cerebral artery. 8. 2 mm medially projecting vascular protrusion arising from the cavernous right ICA, likely reflecting an aneurysm. 9. 1-2 mm inferiorly projecting vascular protrusion arising from the distal cavernous/paraclinoid right ICA, which could reflect atherosclerotic irregularity or a small aneurysm. 10. 2 mm inferiorly projecting vascular protrusion arising from the paraclinoid left ICA, which may reflect an aneurysm or the origin of a partially occluded left posterior communicating artery.  F/U MR Brain WO Contrast IMPRESSION: Limited protocol brain MRI consisting of only diffusion-weighted and SWI sequences. An acute infarct within the right pons is not significantly changed in size as compared to the brain MRI of 02/07/2021, again measuring 1.7 x 0.9 x 1.1 cm. No evidence of hemorrhagic conversion.  DG Chest Port IMPRESSION: No active disease.  Discharge Instructions: Discharge Instructions    Diet - low sodium heart healthy   Complete by: As directed    Increase activity slowly   Complete by: As directed     Ms. Olivia Sanders, it was such a pleasure meeting you and caring for you. You were admitted to the hospital after having a stroke in a part of your brain called the pons. You experienced weakness over your stay on the left side of your body, but it is improving with all of your hard work. While here, we also discovered you have a urinary tract infection. We have started you on a medication called  ciprofloxacin to take  for this infection. You are now going to a rehabilitation center closer to your home in [place] so that you can continue to regain your strength. Here are your discharge instructions: 1. Your rehabilitation facility will help you regain your strength. 2. Continue to take your ciprofloxacin 250 mg tablets twice a day for 5 days. You received one tablet here in the hospital, so take one more tonight. Then take two tablets for another 4 days. 3. Start taking one of your blood pressure (BP) medications, amlodipine 5 mg, once per day. Do not take your furosemide, clonidine, or losartan. We will increase the amlodipine dose as we see your BP response. 4. Continue taking your atorvastatin 40 mg once a day, aspirin 81 mg once a day, and clopidogrel 75 mg once per day. Take your last dose of clopidogrel on June 4th. After that, just continue your aspirin once per day only. 5. 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  Signed: Mikal Plane, Medical Student 02/11/2021, 12:16 PM   Pager: [phone] After 5pm on weekdays and 1pm on weekends: On Call pager [phone]

## 2021-02-11 NOTE — TOC Transition Note (Signed)
Transition of Care [facility]) - CM/SW Discharge Note   Patient Details  Name: Trudy Kory MRN: [id number] Date of Birth: Dec 08, 1923  [facility]) CM/SW Contact:  Emeterio Reeve, [place] Phone Number: 02/11/2021, 12:12 PM   Clinical Narrative:     Patient will DC to: [facility] Anticipated DC date: 02/11/21 Family notified: Family present Transport by: Gaynelle Arabian     Per MD patient ready for DC to [facility] SNF. RN, patient, patient's family, and facility notified of DC. Discharge Summary and covid results. DC packet on chart.      CSW will sign off for now as social work intervention is no longer needed. Please consult [place] again if new needs arise.   Final next level of care: Skilled Nursing Facility Barriers to Discharge: Barriers Resolved   Patient Goals and CMS Choice     Choice offered to / list presented to : [facility]  Discharge Placement              Patient chooses bed at: Other - please specify in the comment section below: Patient to be transferred to facility by: By family car Name of family member notified: Family members present in room Patient and family notified of of transfer: 02/11/21  Discharge Plan and Services In-house Referral: Clinical Social Work                                   Social Determinants of Health ([place]) Interventions     Readmission Risk Interventions No flowsheet data found.   Emeterio Reeve, Latanya Presser, [place] Social Worker [phone]

## 2021-02-11 NOTE — Progress Notes (Signed)
RN called [facility] to give report to the Nurse, Paperwork printed out and and handed to her daughter. IV has been removed and pt dressed.

## 2021-07-03 ENCOUNTER — Telehealth: Payer: Self-pay

## 2021-07-03 NOTE — Telephone Encounter (Signed)
07/03/21 - 12PM    Spoke with daughter and granddaughter and scheduled patient with Dr. Bryson Ha on 08/04/21 at 9am arrival time 830am.    Patient aware of visitor policy and mask requirements.     Address given- [address] 5th floor [place], [place]. [address] [facility]

## 2021-07-29 ENCOUNTER — Encounter (INDEPENDENT_AMBULATORY_CARE_PROVIDER_SITE_OTHER): Payer: Self-pay

## 2021-08-04 ENCOUNTER — Ambulatory Visit: Payer: Medicare Other | Attending: Dermatology | Admitting: Dermatology

## 2021-08-04 VITALS — BP 167/70 | HR 78 | Temp 97.6°F | Resp 16

## 2021-08-04 DIAGNOSIS — C8400 Mycosis fungoides, unspecified site: Secondary | ICD-10-CM | POA: Insufficient documentation

## 2021-08-04 MED ORDER — IMIQUIMOD 5 % EX CREA
TOPICAL_CREAM | CUTANEOUS | 2 refills | Status: DC
Start: 2021-08-04 — End: 2022-09-09

## 2021-08-04 NOTE — Progress Notes (Signed)
Dr. Baird Cancer    I had the pleasure of seeing your patient in our department today for a consult visit. Below, you will find her initial consultation note.    HISTORY OF PRESENT ILLNESS  [name] Wall is 87 y.o. female who is referred by Dr. Baird Cancer to establish care. Pt has been diagnosed with Mycosis Fungoides since 2019, and moved to the area recently. She was previously well controlled with NBUVB, but due to a treatment break of 3 months (stroke) had a flare. She was then bumped back to 3 x a week, and last week was reduced to 2 x a week. She uses clobetasol ointment twice a day.    No history of other skin cancers. Negative family history of skin cancer and lymphoma. Does take lamictal for seizures. Is not currently on HCTZ. No history of SSRIs.       Current Outpatient Medications:     ALPRAZolam (XANAX) 0.25 MG tablet, Take 1 tablet (0.25 mg) by mouth daily as needed, Disp: , Rfl:     ALPRAZolam (XANAX) 0.25 MG tablet, Take 1 tablet (0.25 mg) by mouth daily as needed, Disp: , Rfl:     amLODIPine (NORVASC) 10 MG tablet, Take 1 tablet (10 mg) by mouth daily, Disp: , Rfl:     amLODIPine (NORVASC) 10 MG tablet, Take 1 tablet (10 mg) by mouth daily, Disp: , Rfl:     amLODIPine (NORVASC) 2.5 MG tablet, Take 1 tablet (2.5 mg) by mouth daily, Disp: , Rfl:     amLODIPine (NORVASC) 5 MG tablet, Take 1 tablet (5 mg) by mouth daily, Disp: , Rfl:     amLODIPine (NORVASC) 5 MG tablet, Take 1 tablet (5 mg) by mouth daily, Disp: 30 tablet, Rfl: 11    aspirin 81 MG EC tablet, Take 1 tablet (81 mg) by mouth, Disp: , Rfl:     atorvastatin (LIPITOR) 40 MG tablet, Take 1 tablet (40 mg) by mouth every 24 hours, Disp: , Rfl:     atorvastatin (LIPITOR) 40 MG tablet, Take 1 tablet (40 mg) by mouth daily, Disp: , Rfl:     atorvastatin (LIPITOR) 40 MG tablet, Take 1 tablet (40 mg) by mouth daily, Disp: , Rfl:     clobetasol (TEMOVATE) 0.05 % ointment, APPLY OINTMENT TOPICALLY TO AFFECTED AREA OF BODY TWICE DAILY AS NEEDED FOR RASH DO  NOT APPLY TO FACE OR NECK, Disp: , Rfl:     clobetasol (TEMOVATE) 0.05 % ointment, Apply 1 application topically 2 (two) times daily, Disp: , Rfl:     cloNIDine (CATAPRES) 0.1 MG tablet, Take 1 tablet (0.1 mg) by mouth 2 (two) times daily, Disp: , Rfl:     cloNIDine (CATAPRES) 0.1 MG tablet, Take 1 tablet (0.1 mg) by mouth, Disp: , Rfl:     ferrous gluconate (FERGON) 324 MG tablet, Take 1 tablet (324 mg) by mouth daily, Disp: , Rfl:     ferrous gluconate (FERGON) 324 MG tablet, daily, Disp: , Rfl:     furosemide (LASIX) 20 MG tablet, Take 1 tablet (20 mg) by mouth daily, Disp: , Rfl:     furosemide (LASIX) 20 MG tablet, Take 1 tablet (20 mg) by mouth, Disp: , Rfl:     hydrALAZINE (APRESOLINE) 50 MG tablet, Take 1 tablet (50 mg) by mouth 3 (three) times daily with meals, Disp: , Rfl:     hydrALAZINE (APRESOLINE) 50 MG tablet, Take 1 tablet (50 mg) by mouth every 8 (eight) hours, Disp: , Rfl:  hydrOXYzine (ATARAX) 10 MG tablet, Take 1 tablet (10 mg) by mouth every 8 (eight) hours as needed, Disp: , Rfl:     hydrOXYzine (ATARAX) 10 MG tablet, , Disp: , Rfl:     latanoprost (XALATAN) 0.005 % ophthalmic solution, INSTILL 1 DROP INTO EACH EYE AT BEDTIME, Disp: , Rfl:     latanoprost (XALATAN) 0.005 % ophthalmic solution, Apply 1 drop to eye every 24 hours, Disp: , Rfl:     levETIRAcetam (KEPPRA) 500 MG tablet, Take 1 tablet (500 mg) by mouth 2 (two) times daily, Disp: , Rfl:     levETIRAcetam (KEPPRA) 500 MG tablet, Take 1 tablet (500 mg) by mouth 2 (two) times daily, Disp: , Rfl:     levothyroxine (SYNTHROID) 25 MCG tablet, Take 1 tablet (25 mcg) by mouth daily, Disp: , Rfl:     levothyroxine (SYNTHROID) 25 MCG tablet, Take 1 tablet (25 mcg) by mouth daily, Disp: , Rfl:     losartan (COZAAR) 50 MG tablet, Take 1 tablet (50 mg) by mouth daily, Disp: , Rfl:     losartan (COZAAR) 50 MG tablet, Take 1 tablet (50 mg) by mouth, Disp: , Rfl:     Magnesium Cl-Calcium Carbonate 71.5-119 MG Tablet Delayed Response, daily, Disp:  , Rfl:     nitrofurantoin, macrocrystal-monohydrate, (MACROBID) 100 MG capsule, TAKE 1 CAPSULE BY MOUTH EVERY 12 HOURS WITH FOOD, Disp: , Rfl:     pantoprazole (PROTONIX) 40 MG tablet, Take 1 tablet (40 mg) by mouth daily, Disp: , Rfl:     pantoprazole (PROTONIX) 40 MG tablet, Take 1 tablet (40 mg) by mouth daily, Disp: , Rfl:     traMADol (ULTRAM) 50 MG tablet, TAKE 1 TABLET BY MOUTH EVERY 6 (SIX) HOURS AS NEEDED FOR MODERATE PAIN (PSR 4-6) FOR UP TO 3 DAYS, Disp: , Rfl:     triamterene-hydrochlorothiazide (DYAZIDE) 37.5-25 MG per capsule, Take 1 capsule by mouth daily, Disp: , Rfl:     imiquimod (ALDARA) 5 % cream, Apply nightly three times a week (Monday, Wednesday, Friday) to behind left knee and right ankle, Disp: 96 each, Rfl: 2      Not on File  No past medical history on file.  No past surgical history on file.  No family history on file.  Social History     Socioeconomic History    Marital status: Widowed       REVIEW OF SYSTEMS  Denies cardiovascular, gastrointestinal complaints. Has mobility issues after a stroke.   All other systems were reviewed and are negative except as previously noted in the HPI.      Objective:   BP 167/70 (BP Site: Left arm, Patient Position: Sitting, Cuff Size: Medium)    Pulse 78    Temp 97.6 F (36.4 C) (Oral)    Resp 16    SpO2 98%       Note.General appearance: NAD, conversant.A total body skin examination is completed including palpation of scalp and inspection of hair of scalp, eyebrows, face, chest and extremities and inspection of eccrine and apocrine glands.Dermatoscopy was used to evaluate the nevi.The following anatomic skin sites are examined with findings recorded:  Head including face - no lesions of concern  Neck - no lesions of concern  Chest including breasts, and axillae - no lesions of concern  Abdomen - no lesions of concern  Genitalia, groin, buttocks - thin hyperpigmented patches  Back - thin hyperpigmented patches  Right upper extremity - thin  hyperpigmented patches  Left upper  extremity - thin hyperpigmented patches  Right lower extremity - thin hyperpigmented patches with a plaque right ankle  Left lower extremity - thin hyperpigmented patches with a plaque left popliteal fossa  Examination of lymph node basins is completed, including the H and N, axilla and groin. There is no lymphadenopathy.    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  All staging criteria were explained.  All relevant treatment options were reviewed and the pertinent side effects and standard duration of treatment was discussed.  All questions were answered to the patient's satisfaction.  - Plan to continue NBUVB, but will try to obtain an at home unit due to mobility issues, 3 days a week if able to secure a home unit  - If she cannot get home unit, will set up with local NBUVB 2 x a week  - For the plaques right ankle, left PF, apply one imiquimod packet to each plaque 3 nights a week (M,W,F)  - Discontinue the clobetasol    F/U in 3-4 months    Ala Dach, PA-C  Physician Assistant, [facility]  [facility]  [facility]  [address]  [place], [place] [address]  [phone]  [phone]    I personally reviewed the patients history, pathology when relevant, and completed an independent skin examination. I agree with all components of this written note. I have made addendums where/if needed. JD    The patient has low density and largely asymptomatic Stage 1B disease.  In this 87 year old post CVA patient, we will pursue a more conservative treatment approach aimed at good disease control rather than chasing a CR.    Ferne Reus, MD FAAD  Director, Cutaneous Lymphoma  and  High Risk/Transplant Dermatology  [facility]   [address]  [place], [place], [address]  [phone]  [phone]

## 2021-08-05 NOTE — Progress Notes (Signed)
Daughter [name] called stating that her mother needs to get NBUVB treatment tomorrow.I see it in your note that she will need NBUVB treatment 2x/week. I will send her order and demographic sheet to [facility] in [facility] to get her set up. They are accepting new patient.Daughter wanted something closer. Got her appointment with [facility] in [place] tomorrow at 10:30 am. It is 10 minutes away from them. She was happy with that. Faxed order, demographic sheet and clinical notes.

## 2021-08-06 ENCOUNTER — Telehealth: Payer: Self-pay

## 2021-08-06 NOTE — Telephone Encounter (Addendum)
[name] Wall, patients daughter, called. The patient and daughter are currently at [facility] [address] [place], [place] [address] Telephone: [phone] Fax: [phone].    Per [name] Wall, Dr. Chandra Batch is not sure what the patients diagnosis is and [name] Wall is asking if this RN can spell it for her.     I explained to [name] Wall that the patients clinical notes and order was faxed to Dr. Bjorn Pippin office and they should have her diagnosis.  Provided [name] Wall with the patients diagnosis information.       Attempted to call [facility] three times. Left two detailed voicemail and requested call back.

## 2021-08-25 ENCOUNTER — Encounter (INDEPENDENT_AMBULATORY_CARE_PROVIDER_SITE_OTHER): Payer: Self-pay | Admitting: Emergency Medicine

## 2021-08-25 ENCOUNTER — Ambulatory Visit (INDEPENDENT_AMBULATORY_CARE_PROVIDER_SITE_OTHER): Payer: Medicare Other | Admitting: Emergency Medicine

## 2021-08-25 VITALS — BP 142/84 | HR 71 | Temp 98.1°F | Wt 136.0 lb

## 2021-08-25 DIAGNOSIS — L299 Pruritus, unspecified: Secondary | ICD-10-CM

## 2021-08-25 DIAGNOSIS — I69354 Hemiplegia and hemiparesis following cerebral infarction affecting left non-dominant side: Secondary | ICD-10-CM

## 2021-08-25 DIAGNOSIS — R569 Unspecified convulsions: Secondary | ICD-10-CM

## 2021-08-25 DIAGNOSIS — D509 Iron deficiency anemia, unspecified: Secondary | ICD-10-CM

## 2021-08-25 DIAGNOSIS — R531 Weakness: Secondary | ICD-10-CM

## 2021-08-25 DIAGNOSIS — F419 Anxiety disorder, unspecified: Secondary | ICD-10-CM

## 2021-08-25 DIAGNOSIS — E039 Hypothyroidism, unspecified: Secondary | ICD-10-CM

## 2021-08-25 DIAGNOSIS — K921 Melena: Secondary | ICD-10-CM

## 2021-08-25 DIAGNOSIS — Z7689 Persons encountering health services in other specified circumstances: Secondary | ICD-10-CM

## 2021-08-25 LAB — COMPREHENSIVE METABOLIC PANEL
ALT: 20 U/L (ref 0–55)
AST (SGOT): 27 U/L (ref 5–41)
Albumin/Globulin Ratio: 1 (ref 0.9–2.2)
Albumin: 4 g/dL (ref 3.5–5.0)
Alkaline Phosphatase: 92 U/L (ref 37–117)
Anion Gap: 11 (ref 5.0–15.0)
BUN: 11 mg/dL (ref 7.0–21.0)
Bilirubin, Total: 0.5 mg/dL (ref 0.2–1.2)
CO2: 26 mEq/L (ref 17–29)
Calcium: 9.8 mg/dL (ref 7.9–10.2)
Chloride: 103 mEq/L (ref 99–111)
Creatinine: 0.8 mg/dL (ref 0.4–1.0)
Globulin: 3.9 g/dL — ABNORMAL HIGH (ref 2.0–3.6)
Glucose: 91 mg/dL (ref 70–100)
Potassium: 3.8 mEq/L (ref 3.5–5.3)
Protein, Total: 7.9 g/dL (ref 6.0–8.3)
Sodium: 140 mEq/L (ref 135–145)

## 2021-08-25 LAB — CBC AND DIFFERENTIAL
Absolute NRBC: 0 10*3/uL (ref 0.00–0.00)
Basophils Absolute Automated: 0.03 10*3/uL (ref 0.00–0.08)
Basophils Automated: 0.5 %
Eosinophils Absolute Automated: 0.07 10*3/uL (ref 0.00–0.44)
Eosinophils Automated: 1.2 %
Hematocrit: 35.9 % (ref 34.7–43.7)
Hgb: 11.3 g/dL — ABNORMAL LOW (ref 11.4–14.8)
Immature Granulocytes Absolute: 0.02 10*3/uL (ref 0.00–0.07)
Immature Granulocytes: 0.3 %
Lymphocytes Absolute Automated: 1.14 10*3/uL (ref 0.42–3.22)
Lymphocytes Automated: 19.4 %
MCH: 25.6 pg (ref 25.1–33.5)
MCHC: 31.5 g/dL (ref 31.5–35.8)
MCV: 81.2 fL (ref 78.0–96.0)
MPV: 11 fL (ref 8.9–12.5)
Monocytes Absolute Automated: 0.6 10*3/uL (ref 0.21–0.85)
Monocytes: 10.2 %
Neutrophils Absolute: 4.03 10*3/uL (ref 1.10–6.33)
Neutrophils: 68.4 %
Nucleated RBC: 0 /100 WBC (ref 0.0–0.0)
Platelets: 281 10*3/uL (ref 142–346)
RBC: 4.42 10*6/uL (ref 3.90–5.10)
RDW: 15 % (ref 11–15)
WBC: 5.89 10*3/uL (ref 3.10–9.50)

## 2021-08-25 LAB — HEMOLYSIS INDEX: Hemolysis Index: 7 Index (ref 0–24)

## 2021-08-25 LAB — IRON PROFILE
Iron Saturation: 24 % (ref 15–50)
Iron: 64 ug/dL (ref 40–145)
TIBC: 272 ug/dL (ref 265–497)
UIBC: 208 ug/dL (ref 126–382)

## 2021-08-25 LAB — THYROID STIMULATING HORMONE (TSH), REFLEX ON ABNORMAL TO FREE T4, SERUM: TSH, Abn Reflex to Free T4, Serum: 3.69 u[IU]/mL (ref 0.35–4.94)

## 2021-08-25 LAB — GFR: EGFR: >60

## 2021-08-25 MED ORDER — HYDROXYZINE HCL 10 MG PO TABS
10.0000 mg | ORAL_TABLET | Freq: Three times a day (TID) | ORAL | 1 refills | Status: DC | PRN
Start: 2021-08-25 — End: 2021-08-26

## 2021-08-25 MED ORDER — ALPRAZOLAM 0.25 MG PO TABS
0.2500 mg | ORAL_TABLET | Freq: Every day | ORAL | 0 refills | Status: DC | PRN
Start: 2021-08-25 — End: 2021-08-26

## 2021-08-25 MED ORDER — SUCRALFATE 1 G PO TABS
1.0000 g | ORAL_TABLET | Freq: Four times a day (QID) | ORAL | 0 refills | Status: DC
Start: 2021-08-25 — End: 2021-08-26

## 2021-08-25 MED ORDER — FERROUS GLUCONATE 324 (38 FE) MG PO TABS
1.0000 | ORAL_TABLET | Freq: Every morning | ORAL | 3 refills | Status: DC
Start: 2021-08-25 — End: 2021-08-26

## 2021-08-25 MED ORDER — PANTOPRAZOLE SODIUM 40 MG PO TBEC
40.0000 mg | DELAYED_RELEASE_TABLET | Freq: Every day | ORAL | 1 refills | Status: DC
Start: 2021-08-25 — End: 2021-08-26

## 2021-08-25 NOTE — Progress Notes (Signed)
Subjective:      Date: 08/25/2021 5:25 PM   Patient ID: [name] Wall is a 87 y.o. female.      Chief Complaint:  Chief Complaint   Patient presents with    Hypertension    pt referral    broken dentures     Unable to eat       HPI:  HPI     The pt is a new pt to our clinic who is here to establish care.  Patient presents with her daughter who helps provide much of the history.  Patient previously had a PCP elsewhere but she is recently moved to this area with her daughter.    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  Patient does not have a new neurologist.  Patient ambulates at home with a walker secondary to weakness, hemiparesis, and hemiaplasia but when she is out in public she typically uses a wheelchair.  Patient and daughter are requesting home physical therapy.    Patient has a history of hypertension, hyperlipidemia, hypothyroidism and seizure disorder.    She is requesting refills on Xanax which she takes as needed for anxiety about 3-4 times a month, hydroxyzine which she takes as needed for skin itching, and iron tablets.    Patient is seeing a dermatologist for mycosis fungoides.    Patient's main complaint today is melena.  She has been having melena for the past 3 weeks.  Denies any bright red blood in the stool.  Denies abdominal pain, nausea, vomiting, or diarrhea.  Denies being on any blood thinners besides aspirin.  Has a history of GERD but denies a history of GI bleed.      Problem List:  Patient Active Problem List   Diagnosis    Cerebral infarction, unspecified    Cerebrovascular accident (CVA)    Dysarthria following cerebral infarction    Dysphagia following cerebral infarction    Gastro-esophageal reflux disease without esophagitis    Generalized muscle weakness    Hemiplegia and hemiparesis following cerebral infarction affecting left non-dominant side    Hemiplegia, unspecified affecting left nondominant side     Hyperlipidemia, unspecified    Hypothyroidism, unspecified    Mycosis fungoides, unspecified site    Mycosis fungoides of lymph nodes of inguinal region or lower extremity    Other seizures    Presence of left artificial hip joint    Essential (primary) hypertension    Primary open-angle glaucoma, bilateral, moderate stage    Pruritic disorder    Weakness    Unspecified urinary incontinence    Unspecified hearing loss, unspecified ear    Unspecified glaucoma    Other abnormalities of gait and mobility    Melena    Anxiety    Iron deficiency anemia, unspecified iron deficiency anemia type       Current Medications:  Outpatient Medications Marked as Taking for the 08/25/21 encounter (Office Visit) with Forde Radon, MD   Medication Sig Dispense Refill    ALPRAZolam (XANAX) 0.25 MG tablet Take 1 tablet (0.25 mg) by mouth daily as needed for Anxiety 30 tablet 0    amLODIPine (NORVASC) 10 MG tablet Take 1 tablet (10 mg) by mouth daily      aspirin 81 MG EC tablet Take 1 tablet (81 mg) by mouth      atorvastatin (LIPITOR) 40 MG tablet Take 1 tablet (40 mg) by mouth every 24 hours  ferrous gluconate (FERGON) 324 MG tablet Take 1 tablet (324 mg) by mouth every morning with breakfast 90 tablet 3    hydrALAZINE (APRESOLINE) 50 MG tablet Take 1 tablet (50 mg) by mouth 3 (three) times daily with meals      hydrOXYzine (ATARAX) 10 MG tablet Take 1 tablet (10 mg) by mouth every 8 (eight) hours as needed for Itching 30 tablet 1    imiquimod (ALDARA) 5 % cream Apply nightly three times a week (Monday, Wednesday, Friday) to behind left knee and right ankle 96 each 2    latanoprost (XALATAN) 0.005 % ophthalmic solution INSTILL 1 DROP INTO EACH EYE AT BEDTIME      levETIRAcetam (KEPPRA) 500 MG tablet Take 1 tablet (500 mg) by mouth 2 (two) times daily      levothyroxine (SYNTHROID) 25 MCG tablet Take 1 tablet (25 mcg) by mouth daily      pantoprazole (PROTONIX) 40 MG tablet Take 1 tablet (40 mg) by mouth daily 90 tablet 1     sucralfate (CARAFATE) 1 g tablet Take 1 tablet (1 g) by mouth 4 (four) times daily Dissolve in 10 cc water, prior to ingestion 120 tablet 0    triamcinolone (KENALOG) 0.1 % ointment as needed      [DISCONTINUED] ALPRAZolam (XANAX) 0.25 MG tablet Take 1 tablet (0.25 mg) by mouth daily as needed      [DISCONTINUED] ferrous gluconate (FERGON) 324 MG tablet Take 1 tablet (324 mg) by mouth daily      [DISCONTINUED] hydrOXYzine (ATARAX) 10 MG tablet Take 1 tablet (10 mg) by mouth every 8 (eight) hours as needed      [DISCONTINUED] pantoprazole (PROTONIX) 40 MG tablet Take 1 tablet (40 mg) by mouth daily         Allergies:  Allergies   Allergen Reactions    Penicillins        Past Medical History:  History reviewed. No pertinent past medical history.    Past Surgical History:  History reviewed. No pertinent surgical history.    Family History:  History reviewed. No pertinent family history.    Social History:  Social History     Socioeconomic History    Marital status: Widowed   Tobacco Use    Smoking status: Never    Smokeless tobacco: Never   Vaping Use    Vaping Use: Never used   Substance and Sexual Activity    Alcohol use: Not Currently    Drug use: Never    Sexual activity: Not Currently        The following sections were reviewed this encounter by the provider:   Tobacco  Allergies  Meds  Problems  Med Hx  Surg Hx  Fam Hx           ROS:  Review of Systems   Constitutional: Negative.  Negative for chills, fatigue and fever.   HENT: Negative.  Negative for congestion and sore throat.    Eyes: Negative.  Negative for pain and visual disturbance.   Respiratory: Negative.  Negative for cough and shortness of breath.    Cardiovascular: Negative.  Negative for chest pain and palpitations.   Gastrointestinal:  Positive for blood in stool. Negative for abdominal pain, nausea and vomiting.   Endocrine: Negative.  Negative for cold intolerance and heat intolerance.   Genitourinary: Negative.  Negative for difficulty  urinating and dysuria.   Musculoskeletal:  Positive for arthralgias, gait problem and myalgias. Negative for back pain.   Skin: Negative.  Negative for color change and rash.   Neurological:  Positive for weakness. Negative for dizziness and headaches.   Psychiatric/Behavioral: Negative.  Negative for agitation and behavioral problems.    All other systems reviewed and are negative.   Objective:   Vitals:  BP 142/84   Pulse 71   Temp 98.1 F (36.7 C)   Wt 61.7 kg (136 lb)   SpO2 98%       Physical Exam:  Physical Exam  Vitals and nursing note reviewed.   Constitutional:       General: She is not in acute distress.     Appearance: Normal appearance. She is not toxic-appearing.      Comments: Elderly female who appears stated age seated in a wheelchair.   HENT:      Head: Normocephalic and atraumatic.   Eyes:      Extraocular Movements: Extraocular movements intact.      Conjunctiva/sclera: Conjunctivae normal.      Pupils: Pupils are equal, round, and reactive to light.   Cardiovascular:      Rate and Rhythm: Normal rate and regular rhythm.      Pulses: Normal pulses.   Pulmonary:      Effort: Pulmonary effort is normal.      Breath sounds: Normal breath sounds.   Abdominal:      General: Abdomen is flat. Bowel sounds are normal.      Palpations: Abdomen is soft.      Tenderness: There is no abdominal tenderness.   Musculoskeletal:         General: Normal range of motion.      Cervical back: Normal range of motion.   Skin:     General: Skin is warm.   Neurological:      General: No focal deficit present.      Mental Status: She is alert and oriented to person, place, and time.   Psychiatric:         Mood and Affect: Mood normal.         Behavior: Behavior normal.         Thought Content: Thought content normal.         Judgment: Judgment normal.      Assessment:       1. Encounter to establish care    2. Melena  - Ambulatory referral to Gastroenterology  - CBC and differential  - Comprehensive metabolic panel  -  pantoprazole (PROTONIX) 40 MG tablet; Take 1 tablet (40 mg) by mouth daily  Dispense: 90 tablet; Refill: 1  - sucralfate (CARAFATE) 1 g tablet; Take 1 tablet (1 g) by mouth 4 (four) times daily Dissolve in 10 cc water, prior to ingestion  Dispense: 120 tablet; Refill: 0  - OCCULT BLOOD X 3, STOOL; Future    3. Iron deficiency anemia, unspecified iron deficiency anemia type  - ferrous gluconate (FERGON) 324 MG tablet; Take 1 tablet (324 mg) by mouth every morning with breakfast  Dispense: 90 tablet; Refill: 3  - IRON PROFILE    4. Weakness  - Ambulatory referral to Home Health    5. Hemiplegia and hemiparesis following cerebral infarction affecting left non-dominant side  - Ambulatory referral to Home Health    6. Hypothyroidism, unspecified type  - TSH, Abn Reflex to Free T4, Serum    7. Seizure  - Neurology Referral: Richrd Humbles, MD [facility])    8. Anxiety  - ALPRAZolam (XANAX) 0.25 MG tablet; Take 1 tablet (0.25 mg) by  mouth daily as needed for Anxiety  Dispense: 30 tablet; Refill: 0    9. Pruritic disorder  - hydrOXYzine (ATARAX) 10 MG tablet; Take 1 tablet (10 mg) by mouth every 8 (eight) hours as needed for Itching  Dispense: 30 tablet; Refill: 1      Plan:     Problem   Pruritic Disorder    08/25/21: History of intermittent pruritus.  Uses hydroxyzine as needed.  Counseled patient to be careful taking this medication as it can make her drowsy.  Patient provided understanding.     Anxiety    08/25/21: Uses Xanax as needed for anxiety about 3-4 times per month.  30 tablets supply lasting pt about 6 months.  Refill provided.     Iron Deficiency Anemia, Unspecified Iron Deficiency Anemia Type    08/25/21: We will check CBC and iron panel.  Iron tablets refilled.     Cerebral Infarction, Unspecified   Dysarthria Following Cerebral Infarction   Dysphagia Following Cerebral Infarction   Gastro-Esophageal Reflux Disease Without Esophagitis   Generalized Muscle Weakness   Hemiplegia and Hemiparesis Following  Cerebral Infarction Affecting Left Non-Dominant Side    08/25/21: History of left sided weakness status post CVA.  Referral provided to home health for PT as requested.     Hemiplegia, Unspecified Affecting Left Nondominant Side   Hypothyroidism, Unspecified   Mycosis Fungoides, Unspecified Site   Presence of Left Artificial Hip Joint   Weakness   Unspecified Urinary Incontinence   Unspecified Hearing Loss, Unspecified Ear   Unspecified Glaucoma   Other Abnormalities of Gait and Mobility   Cerebrovascular Accident (Cva)   Other Seizures    08/25/21: History of seizure disorder currently on Keppra.  New to this area and does not have a neurologist, referral provided.     Melena    08/25/21: New patient presenting with melena x3 weeks.  Concerned for upper GI bleed versus possibly a side effect of the iron tablets.  Will check H&H.  Started the patient on Protonix and Carafate.  Stool occult blood ordered.  Counseled patient to follow-up with a gastroenterologist ASAP.  Referral provided.  Counseled on acute or worsening signs or symptoms that would require immediate attention in the ER.  Patient and daughter provided understanding.     Mycosis Fungoides of Lymph Nodes of Inguinal Region Or Lower Extremity   Primary Open-Angle Glaucoma, Bilateral, Moderate Stage    Last Assessment & Plan:   Formatting of this note might be different from the original.  A reduction of 15% was achieved overall from baseline.  Pt doing well on current therapy.  No [place] changes or treatment modifications warranted at this time.     Hyperlipidemia, Unspecified   Essential (Primary) Hypertension        Return in about 2 weeks (around 09/08/2021).    Risks and benefits of medical care (including but not limited to vaccinations, medications, blood tests, imaging, offsite referrals, etc.) discussed in detail with the patient. All questions and concerns were satisfactorily addressed as per the patient. The patient acknowledges and understands the  treatment plan discussed during today's visit and if the patient has any adverse outcome or worsening condition, the patient will follow up immediately in the clinic or at an ER/Urgent care facility.     Forde Radon, MD

## 2021-08-25 NOTE — Progress Notes (Signed)
Have you seen any specialists/other providers since your last visit with [place]?    Yes    The patient was informed that the following HM items are still outstanding:     Health Maintenance Due   Topic Date Due    Pneumonia Vaccine Age 85+ (1 - PCV) Never done    COVID-19 Vaccine (3 - Booster for Moderna series) 01/19/2020

## 2021-08-26 ENCOUNTER — Other Ambulatory Visit (INDEPENDENT_AMBULATORY_CARE_PROVIDER_SITE_OTHER): Payer: Self-pay | Admitting: Emergency Medicine

## 2021-08-26 DIAGNOSIS — D509 Iron deficiency anemia, unspecified: Secondary | ICD-10-CM

## 2021-08-26 DIAGNOSIS — K921 Melena: Secondary | ICD-10-CM

## 2021-08-26 DIAGNOSIS — L299 Pruritus, unspecified: Secondary | ICD-10-CM

## 2021-08-26 DIAGNOSIS — F419 Anxiety disorder, unspecified: Secondary | ICD-10-CM

## 2021-08-26 MED ORDER — ALPRAZOLAM 0.25 MG PO TABS
0.2500 mg | ORAL_TABLET | Freq: Every day | ORAL | 0 refills | Status: DC | PRN
Start: 2021-08-26 — End: 2022-09-09

## 2021-08-26 MED ORDER — PANTOPRAZOLE SODIUM 40 MG PO TBEC
40.0000 mg | DELAYED_RELEASE_TABLET | Freq: Every day | ORAL | 1 refills | Status: DC
Start: 2021-08-26 — End: 2022-09-09

## 2021-08-26 MED ORDER — SUCRALFATE 1 G PO TABS
1.0000 g | ORAL_TABLET | Freq: Four times a day (QID) | ORAL | 0 refills | Status: AC
Start: 2021-08-26 — End: 2021-09-25

## 2021-08-26 MED ORDER — FERROUS GLUCONATE 324 (38 FE) MG PO TABS
1.0000 | ORAL_TABLET | Freq: Every morning | ORAL | 3 refills | Status: DC
Start: 2021-08-26 — End: 2022-09-09

## 2021-08-26 MED ORDER — HYDROXYZINE HCL 10 MG PO TABS
10.0000 mg | ORAL_TABLET | Freq: Three times a day (TID) | ORAL | 1 refills | Status: DC | PRN
Start: 2021-08-26 — End: 2022-09-09

## 2021-08-26 NOTE — Telephone Encounter (Signed)
Patient would like medication sent to a different .pharmacy

## 2021-08-27 ENCOUNTER — Other Ambulatory Visit: Payer: Medicare Other

## 2021-08-27 DIAGNOSIS — K921 Melena: Secondary | ICD-10-CM

## 2021-08-28 ENCOUNTER — Telehealth (INDEPENDENT_AMBULATORY_CARE_PROVIDER_SITE_OTHER): Payer: Self-pay | Admitting: Emergency Medicine

## 2021-08-28 ENCOUNTER — Other Ambulatory Visit (FREE_STANDING_LABORATORY_FACILITY): Payer: Medicare Other

## 2021-08-28 DIAGNOSIS — K921 Melena: Secondary | ICD-10-CM

## 2021-08-28 LAB — STOOL OCCULT BLOOD SINGLE SPECIMEN: Stool Occult Blood Single Specimen: NEGATIVE

## 2021-08-28 NOTE — Telephone Encounter (Signed)
Pt needs Dr.Rao to call ASAP regarding meds please ASAP               Pt [phone] [name] Wall

## 2021-09-01 ENCOUNTER — Telehealth (INDEPENDENT_AMBULATORY_CARE_PROVIDER_SITE_OTHER): Payer: Self-pay | Admitting: Emergency Medicine

## 2021-09-01 NOTE — Telephone Encounter (Signed)
Patient daughter called requesting to speak to a nurse in regards to her mothers medication. Per caller pt needs instructions on how or what medication to take. Call got disconnected.     Please contact patient.     Please advise.

## 2021-09-01 NOTE — Progress Notes (Signed)
Please call pt and let them know that all of their recent lab results were normal. Thank you.

## 2021-09-02 NOTE — Telephone Encounter (Signed)
Informed daughter of negative stool results. Daughter stated she is not sure why Dr. Smith Robert prescribed pantoprazole and sucralfate. Daughter informed me that she did inform provider during her appointment on 08/25/21 that patient has had episode of stool being black. Informed daughter that because of the information that was provided to Dr. Smith Robert, that is why the medication was prescribed. Daughter stated she is very frustrated and will be complaining. Informed daughter that we do apologize for our in-timely response. Daughter stated she would like a call from no one else except Dr. Smith Robert. Informed daughter I will send Dr. Smith Robert a message and she will reach out as soon as she can.    Daughter understood and had no other questions/concerns.

## 2021-09-02 NOTE — Telephone Encounter (Signed)
I contacted patients daughter in regards to message. She expressed frustrations of not hearing back from provider sooner. States she has sent multiple messages/calls in regards to concerns. I  notified her of normal lab results from 08/25/2021 per provider. Patients daughters top priority is knowing results of stool sample submitted 08/27/2021. Due to patient having black stool.    Daughter [name] Wall is also requesting clarification for the following medication:  Sucralfate 1g  Pantoprazole 40 mg    States does not feel comfortable giving patient medication until understanding what medication is for and what it does.    Daughter requesting a call back from provider when available [phone].

## 2021-09-02 NOTE — Telephone Encounter (Signed)
September 02, 2021  6:25 PM    I called and spoke to the patient's daughter [name].    I explained all recent lab results were normal and stool blood test was negative for blood.  Patient has not been taking the PPI or the Carafate, suggested holding those medications for now since tests came back normal.  Patient has an appointment with a GI doctor tomorrow due to recent history of melena.    All questions answered.  Patient provided understanding.

## 2021-09-09 ENCOUNTER — Telehealth: Payer: Self-pay

## 2021-09-09 NOTE — Telephone Encounter (Signed)
Dr. Bjorn Pippin office at [facility], patient phototherapy referral location, called  requesting office notes.     Most recent Dr. Bryson Ha office note faxed to Fax:  [phone]

## 2021-09-15 ENCOUNTER — Ambulatory Visit (INDEPENDENT_AMBULATORY_CARE_PROVIDER_SITE_OTHER): Payer: Medicare Other | Admitting: Family Medicine

## 2021-09-25 ENCOUNTER — Encounter: Payer: Self-pay | Admitting: Dermatology

## 2021-09-25 ENCOUNTER — Telehealth: Payer: Self-pay

## 2021-09-25 NOTE — Telephone Encounter (Addendum)
Patients' daughter, [name], called to report increase in skin lesions. Patient is currently undergoing light therapy up to 2  minutes. Being treated with Dr. Chandra Batch at [facility].     [name] reports the patient has new lesions all over her body. One lesion to right ankle which [name] describes as large, and with drainage.  Another lesion concerning developing on right knee as well.     [name] is applying triamcinolone ointment prescribed Dr. Chandra Batch, she also explains Dr. Chandra Batch advised the patient see Dr. Bryson Ha for additional treatment options.     [name] has also been applying imiquimod to some new lesions as they develop but explains she is running out.     The patient explains she feels fine, is without flu like/cellulitis/infectious like symptoms.     The patients daughter is concerned for healing and infection. I asked for photos to be sent through my chart.      Recommendations:   Please see my chart message sent 12/30 containing photos.   Discussed patient symptoms with Dr. Ruel Favors as Dr. Bryson Ha is out of office.     Based on current photos, no concern for evolving infection. Inflammation noted on photo review. [name] was advised to continue application of Triamcinolone as advised by Dr. Bjorn Pippin team.     Given extended weekend and concern for worsening skin breakdown or potentially aggravating symptoms, patient was also advised to hold imiquimod application at this time until further reviewed by Dr. Bryson Ha on 09/29/2021.     If the patient develops acute changes over the weekend concerning for developed infection advised [name] to reach out to on-call or seek urgent care in order to start Doxycycline given Penicillin allergy.     [name] has on-call provider phone number if necessary however for life threatening or critically concerning changes advised [name] to seek ED/urgent care.

## 2021-10-01 ENCOUNTER — Telehealth: Payer: Self-pay

## 2021-10-01 NOTE — Telephone Encounter (Signed)
Dr. Bryson Ha was made aware of updates from Friday 12/30. Please see telephone encounter and patient message thread.     Dr. Bryson Ha advised to stop imiquimod for now.  Use antibacterial soap and apply triamcinolone.  Dr. Bryson Ha does not see any evidence of infection based on the photos from 12/30.     Spoke to patients daughter, [name] Wall. [name] Wall verbalized understanding and reports improvement to the patients lesions since Friday. [name] Wall will start using antibacterial soap when cleansing, continue application of Triamcinolone, hold Imiquimod use, and will call if there are any further questions or concerns.

## 2021-10-15 ENCOUNTER — Ambulatory Visit (INDEPENDENT_AMBULATORY_CARE_PROVIDER_SITE_OTHER): Payer: Medicare Other | Admitting: Residents

## 2021-11-02 ENCOUNTER — Telehealth: Payer: Self-pay | Admitting: Dermatology

## 2021-11-02 NOTE — Progress Notes (Unsigned)
HISTORY OF PRESENT ILLNESS  [name] CairoMildred Wall is 88 y.o. 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  She was previously treated with NBUVB.  I met her in November 2022 when she [facility] and noted Stage 1B, limited thin patch disease.  The patient is post-CVA and in her 90's making phototherapy suboptimal.  I recommended topical imiquimod MWF.  She used this with a brisk inflammatory response.  She has held imiquimod since 10/01/21.  Using TAC 0.1% prn.    No history of other skin cancers. Negative family history of skin cancer and lymphoma. Does take lamictal for seizures. Is not currently on HCTZ. No history of SSRIs.       Current Outpatient Medications:     ALPRAZolam (XANAX) 0.25 MG tablet, Take 1 tablet (0.25 mg) by mouth daily as needed for Anxiety, Disp: 30 tablet, Rfl: 0    amLODIPine (NORVASC) 10 MG tablet, Take 1 tablet (10 mg) by mouth daily, Disp: , Rfl:     aspirin 81 MG EC tablet, Take 1 tablet (81 mg) by mouth, Disp: , Rfl:     atorvastatin (LIPITOR) 40 MG tablet, Take 1 tablet (40 mg) by mouth every 24 hours, Disp: , Rfl:     ferrous gluconate (FERGON) 324 MG tablet, Take 1 tablet (324 mg) by mouth every morning with breakfast, Disp: 90 tablet, Rfl: 3    hydrALAZINE (APRESOLINE) 50 MG tablet, Take 1 tablet (50 mg) by mouth 3 (three) times daily with meals, Disp: , Rfl:     hydrOXYzine (ATARAX) 10 MG tablet, Take 1 tablet (10 mg) by mouth every 8 (eight) hours as needed for Itching, Disp: 30 tablet, Rfl: 1    imiquimod (ALDARA) 5 % cream, Apply nightly three times a week (Monday, Wednesday, Friday) to behind left knee and right ankle, Disp: 96 each, Rfl: 2    latanoprost (XALATAN) 0.005 % ophthalmic solution, INSTILL 1 DROP INTO EACH EYE AT BEDTIME, Disp: , Rfl:     levETIRAcetam (KEPPRA) 500 MG tablet, Take 1 tablet (500 mg) by mouth 2 (two) times daily, Disp: , Rfl:     levothyroxine (SYNTHROID) 25 MCG tablet, Take 1 tablet (25 mcg) by mouth daily, Disp: , Rfl:     losartan (COZAAR)  50 MG tablet, Take 1 tablet (50 mg) by mouth daily (Patient not taking: Reported on 08/25/2021), Disp: , Rfl:     pantoprazole (PROTONIX) 40 MG tablet, Take 1 tablet (40 mg) by mouth daily, Disp: 90 tablet, Rfl: 1    triamcinolone (KENALOG) 0.1 % ointment, as needed, Disp: , Rfl:       Allergies   Allergen Reactions    Penicillins      No past medical history on file.  No past surgical history on file.  No family history on file.  Social History     Socioeconomic History    Marital status: Widowed   Tobacco Use    Smoking status: Never    Smokeless tobacco: Never   Vaping Use    Vaping Use: Never used   Substance and Sexual Activity    Alcohol use: Not Currently    Drug use: Never    Sexual activity: Not Currently       REVIEW OF SYSTEMS  Denies cardiovascular, gastrointestinal complaints. Has mobility issues after a stroke.   All other systems were reviewed and are negative except as previously noted in the HPI.      Objective:   There were no vitals taken for  this visit.      Note.General appearance: NAD, conversant.A total body skin examination is completed including palpation of scalp and inspection of hair of scalp, eyebrows, face, chest and extremities and inspection of eccrine and apocrine glands.Dermatoscopy was used to evaluate the nevi.The following anatomic skin sites are examined with findings recorded:  Head including face - no lesions of concern  Neck - no lesions of concern  Chest including breasts, and axillae - no lesions of concern  Abdomen - no lesions of concern  Genitalia, groin, buttocks - thin hyperpigmented patches  Back - thin hyperpigmented patches  Right upper extremity - thin hyperpigmented patches  Left upper extremity - thin hyperpigmented patches  Right lower extremity - thin hyperpigmented patches with a plaque right ankle  Left lower extremity - thin hyperpigmented patches with a plaque left popliteal fossa  Examination of lymph node basins is completed, including the H and N, axilla  and groin. There is no lymphadenopathy.    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  All staging criteria were explained.  All relevant treatment options were reviewed and the pertinent side effects and standard duration of treatment was discussed.  All questions were answered to the patient's satisfaction.  - Plan to continue NBUVB, but will try to obtain an at home unit due to mobility issues, 3 days a week if able to secure a home unit  - If she cannot get home unit, will set up with local NBUVB 2 x a week  - For the plaques right ankle, left PF, apply one imiquimod packet to each plaque 3 nights a week (M,W,F)  - Discontinue the clobetasol    F/U in 3-4 months      [name] Reus, MD FAAD  Director, Cutaneous Lymphoma and  High Risk/Transplant Dermatology  [facility]   [address]  [place], [place], [address]  [phone]  [phone]

## 2021-11-02 NOTE — Telephone Encounter (Signed)
Confirmed appointment for 11/03/2021: Advised patient to arrive 20min prior to appointment time to complete any paperwork. Advised patient of visitor policy and mask requirement.

## 2021-11-03 ENCOUNTER — Ambulatory Visit: Payer: Self-pay

## 2021-11-03 ENCOUNTER — Encounter: Payer: Self-pay | Admitting: Dermatology

## 2021-11-03 ENCOUNTER — Ambulatory Visit: Payer: Medicare Other | Attending: Dermatology | Admitting: Dermatology

## 2021-11-03 ENCOUNTER — Other Ambulatory Visit (FREE_STANDING_LABORATORY_FACILITY): Payer: Medicare Other

## 2021-11-03 VITALS — BP 169/68 | HR 86 | Temp 97.7°F | Resp 14 | Wt 126.3 lb

## 2021-11-03 DIAGNOSIS — C8599 Non-Hodgkin lymphoma, unspecified, extranodal and solid organ sites: Secondary | ICD-10-CM | POA: Insufficient documentation

## 2021-11-03 DIAGNOSIS — C8400 Mycosis fungoides, unspecified site: Secondary | ICD-10-CM | POA: Insufficient documentation

## 2021-11-03 LAB — CBC AND DIFFERENTIAL
Absolute NRBC: 0 10*3/uL (ref 0.00–0.00)
Basophils Absolute Automated: 0.04 10*3/uL (ref 0.00–0.08)
Basophils Automated: 0.7 %
Eosinophils Absolute Automated: 0.07 10*3/uL (ref 0.00–0.44)
Eosinophils Automated: 1.2 %
Hematocrit: 39.3 % (ref 34.7–43.7)
Hgb: 12.4 g/dL (ref 11.4–14.8)
Immature Granulocytes Absolute: 0.02 10*3/uL (ref 0.00–0.07)
Immature Granulocytes: 0.3 %
Instrument Absolute Neutrophil Count: 4.02 10*3/uL (ref 1.10–6.33)
Lymphocytes Absolute Automated: 1.16 10*3/uL (ref 0.42–3.22)
Lymphocytes Automated: 19.9 %
MCH: 25.5 pg (ref 25.1–33.5)
MCHC: 31.6 g/dL (ref 31.5–35.8)
MCV: 80.7 fL (ref 78.0–96.0)
MPV: 9.9 fL (ref 8.9–12.5)
Monocytes Absolute Automated: 0.51 10*3/uL (ref 0.21–0.85)
Monocytes: 8.8 %
Neutrophils Absolute: 4.02 10*3/uL (ref 1.10–6.33)
Neutrophils: 69.1 %
Nucleated RBC: 0 /100 WBC (ref 0.0–0.0)
Platelets: 277 10*3/uL (ref 142–346)
RBC: 4.87 10*6/uL (ref 3.90–5.10)
RDW: 15 % (ref 11–15)
WBC: 5.82 10*3/uL (ref 3.10–9.50)

## 2021-11-03 NOTE — Progress Notes (Signed)
Per. Dr. Bryson Ha, I called [facility] where the patient is getting her phototherapy, to request the patient's recent biopsy results. LVM with our call back number.  (11/03/2021)

## 2021-11-05 LAB — LAB USE ONLY - HISTORICAL NON-GYN MEDICAL CYTOLOGY

## 2021-11-05 NOTE — Progress Notes (Addendum)
Tried reaching [facility] again regarding the patient's pathology report , still no response. Left VM for Nurse with our fax and call back number.   (11/05/2021)

## 2021-11-07 LAB — T-CELL GENE REARRANGEMENT
T-cell receptor beta (TCRB): POSITIVE
T-cell receptor gamma (TCRG): POSITIVE

## 2021-11-12 ENCOUNTER — Telehealth: Payer: Self-pay

## 2021-11-12 NOTE — Telephone Encounter (Addendum)
-----   Message from Delila Pereyra, MD sent at 11/12/2021 12:18 PM EST -----  Disregard prior note. Call patient and tell her that all labs are normal.    ----- Message -----  From: Interface, Lab In [place]  Sent: 11/05/2021   5:41 PM EST  To: Delila Pereyra, MD    Called patients daughter, [name], to go over results. No answer. Left voicemail requesting call back.

## 2021-11-13 ENCOUNTER — Telehealth: Payer: Self-pay

## 2021-11-13 NOTE — Telephone Encounter (Addendum)
-----   Message from Delila Pereyra, MD sent at 11/12/2021 12:18 PM EST -----  Disregard prior note. Call patient and tell her that all labs are normal.    ----- Message -----  From: Interface, Lab In [place]  Sent: 11/05/2021   5:41 PM EST  To: Delila Pereyra, MD    Spoke to the patients daughter, [name], who verbalized understanding of lab results.     [name] wondered if Dr. Bryson Ha had received biopsy results from [facility], Dr. Chandra Batch. Results have not been received as of yet and per documentation of Medical Assistant Amal, we were previously unsuccessfully in reaching a representative.     Called Dr. Bjorn Pippin office, [facility]. Unable to reach a representative. Left detailed voicemail with our request and also requested call back to discuss further.     [name] has planned trip to [place] at the end of February 2023. The trip will be for two weeks and [name] would like to know if the patient can miss two weeks of phototherapy treatment. [name] is worried for rebound/worsening symptoms.   [name] knows Dr. Bryson Ha is out of office at this time but once advised, this RN will call [name] to discuss further.

## 2021-11-16 NOTE — Telephone Encounter (Signed)
Victorino Dike, with Dr. Bjorn Pippin office, called 11/16/21 and confirmed biopsy results are still processing a few weeks ago.

## 2021-11-17 NOTE — Telephone Encounter (Addendum)
[name] Wall, the patients daughter, has planned trip to [place] at the end of February 2023. The trip will be for two weeks and [name] Wall would like to know if the patient can miss two weeks of phototherapy treatment. [name] Wall is worried for rebound/worsening symptoms.     Dr. Bryson Ha ok'd proceeding with planned travel, and advised the patient/daughter not to worry about missing phototherapy.     Dr. Bryson Ha attempted to reach Dr. Bjorn Pippin team about biopsy results pending, she left voicemail for Dr. Chandra Batch to call her back directly.     Unable to reach [place] [facility] to discuss the above. Left voicemail requesting call back.

## 2021-11-19 ENCOUNTER — Other Ambulatory Visit (INDEPENDENT_AMBULATORY_CARE_PROVIDER_SITE_OTHER): Payer: Self-pay | Admitting: Dermatology

## 2021-11-19 ENCOUNTER — Encounter: Payer: Self-pay | Admitting: Dermatology

## 2021-11-19 ENCOUNTER — Ambulatory Visit: Payer: Medicare Other | Admitting: Dermatology

## 2021-11-19 ENCOUNTER — Ambulatory Visit: Payer: Medicare Other | Attending: Dermatology | Admitting: Dermatology

## 2021-11-19 VITALS — Ht 60.0 in | Wt 136.0 lb

## 2021-11-19 DIAGNOSIS — C8400 Mycosis fungoides, unspecified site: Secondary | ICD-10-CM | POA: Insufficient documentation

## 2021-11-19 NOTE — Progress Notes (Signed)
TELEPHONE VISIT    HISTORY OF PRESENT ILLNESS    [name] Wall is 88 y.o. 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  She was previously treated with NBUVB.  I met her in November 2022 when she relocated to [facility] and noted Stage 1B, limited thin patch disease.  The patient is post-CVA and in her 90's making phototherapy challenging but doable.  I recommended topical imiquimod MWF.  She used this with a brisk inflammatory response.  She has held imiquimod since 10/01/21.  Using TAC 0.1% prn.  Receiving NBUVB 6+ minutes per session twice weekly.     In early January 2023, her daughter noticed 3 new lesions which are much more raised.  She presents earlier than scheduled for urgent examination.  The MD at [facility] (phototherapy site) performed 2 punch biopsies of the new thick plaques last week.  Results pending.     At her last OV, I noted a morphologic change to plaques/tumors on LEs and new inguinal LAN. Ordered flow and PET.  Call today to review results.     No history of other skin cancers. Negative family history of skin cancer and lymphoma.       Current Outpatient Medications:     ALPRAZolam (XANAX) 0.25 MG tablet, Take 1 tablet (0.25 mg) by mouth daily as needed for Anxiety, Disp: 30 tablet, Rfl: 0    amLODIPine (NORVASC) 10 MG tablet, Take 1 tablet (10 mg) by mouth daily, Disp: , Rfl:     aspirin 81 MG EC tablet, Take 1 tablet (81 mg) by mouth, Disp: , Rfl:     atorvastatin (LIPITOR) 40 MG tablet, Take 1 tablet (40 mg) by mouth every 24 hours, Disp: , Rfl:     ferrous gluconate (FERGON) 324 MG tablet, Take 1 tablet (324 mg) by mouth every morning with breakfast, Disp: 90 tablet, Rfl: 3    hydrALAZINE (APRESOLINE) 50 MG tablet, Take 1 tablet (50 mg) by mouth 3 (three) times daily with meals, Disp: , Rfl:     hydrOXYzine (ATARAX) 10 MG tablet, Take 1 tablet (10 mg) by mouth every 8 (eight) hours as needed for Itching, Disp: 30 tablet, Rfl: 1    imiquimod (ALDARA) 5 % cream, Apply nightly three  times a week (Monday, Wednesday, Friday) to behind left knee and right ankle, Disp: 96 each, Rfl: 2    latanoprost (XALATAN) 0.005 % ophthalmic solution, INSTILL 1 DROP INTO EACH EYE AT BEDTIME, Disp: , Rfl:     levETIRAcetam (KEPPRA) 500 MG tablet, Take 1 tablet (500 mg) by mouth 2 (two) times daily, Disp: , Rfl:     levothyroxine (SYNTHROID) 25 MCG tablet, Take 1 tablet (25 mcg) by mouth daily, Disp: , Rfl:     losartan (COZAAR) 50 MG tablet, Take 1 tablet (50 mg) by mouth daily (Patient not taking: Reported on 08/25/2021), Disp: , Rfl:     pantoprazole (PROTONIX) 40 MG tablet, Take 1 tablet (40 mg) by mouth daily, Disp: 90 tablet, Rfl: 1    triamcinolone (KENALOG) 0.1 % ointment, as needed, Disp: , Rfl:       Allergies   Allergen Reactions    Penicillins      No past medical history on file.  No past surgical history on file.  No family history on file.  Social History     Socioeconomic History    Marital status: Widowed   Tobacco Use    Smoking status: Never    Smokeless tobacco: Never  Vaping Use    Vaping Use: Never used   Substance and Sexual Activity    Alcohol use: Not Currently    Drug use: Never    Sexual activity: Not Currently     Pathology reports     07/14/18    Procedure Date & Time: 11/03/2021, 09:00                              FLOW CYTOMETRY REPORT          FLOW INTERPRETATION:          PERIPHERAL BLOOD: NO IMMUNOPHENOTYPIC ABNORMALITIES IDENTIFIED          COMMENT:     A population (12%) of lymphocytes is detected. About 53% of these cells     are CD3+ T cells and 30% are NK cells. Expressed as a percentage of     total T cells, CD4+/CD7-T cells are 4.0%, and CD4+/CD26-T cells are     3.9%. The calculated CD4/CD8 ratio is about 5.7. 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 Correlation with     morphology, clinical findings and other laboratory studies is     recommended for a complete evaluation.        PET CT WHOLE BODY     HISTORY: Mycosis fungoides, primarily  cutaneous lymphoma (diagnosed in  2019). Post imiquimod, which has been held since 10/01/2021. Restaging.     TECHNIQUE:      Dose: 9.99  mCi F-18 fluorodeoxyglucose (F-18 FDG)  Serum Glucose: 104 mg/dL  Time from Z-61 FDG injection to scan: 62 min     The patient fasted prior to this examination for greater than six hours.  Positron emission tomography (PET) images were obtained from the convexity  of the calvarium through the feet. As part of this examination, a  nondiagnostic CT scan without intravenous contrast was obtained over the  same region utilizing a low tube current technique for anatomical  correlation and attenuation correction. Oral contrast was provided. Images  were reconstructed in the axial, sagittal and coronal planes.     CORRELATION: None available.     FINDINGS:      Mediastinum: SUV max 2.4  Liver: SUV max 3.2     HEAD AND NECK: Normal FDG distribution within the nasopharynx, oropharynx,  larynx and small sized thyroid gland. Limited evaluation of the brain  parenchyma demonstrates no discrete focus of increased FDG uptake. No FDG  avid cervical or supraclavicular lymph nodes. Minimal leftward deviation of  the nasal septum.     THORAX: No hypermetabolic foci within the lung parenchyma. Mildly FDG avid  subcentimeter bilateral axillary lymph nodes, likely inflammatory/reactive.  No other FDG avid thoracic lymph nodes. Coronary artery calcifications.  Aortic valvular calcifications. Small hiatal hernia. Bibasilar bandlike  linear atelectasis or scarring.     ABDOMEN AND PELVIS:   *  FDG avid right external iliac node (image 239): 0.4 x 0.7 cm with SUV  max 4.7  *  FDG avid right inguinal nodes, for example on image 243: up to 1.5 x  0.7 cm with SUV max up to 3.4     Mildly FDG avid subcentimeter left inguinal lymph nodes, likely  reactive/inflammatory.     Normal FDG distribution within the liver, spleen, mild atrophic pancreas,  and adrenal glands. Cholelithiasis. Non-FDG avid exophytic right  renal  cyst, possibly containing a septation, measuring 6.0  x 4.9 cm (image 190).  Colonic diverticula, especially within the sigmoid colon, without evidence  of diverticulitis. Multiple pelvic phleboliths.     Below the knee misregistration somewhat limits evaluation.  MUSCULOSKELETAL/EXTREMITIES:   No hypermetabolic foci within the bone marrow. Multilevel spinal  degenerative changes. No aggressive lytic or sclerotic lesion. Right total  hip arthroplasty, with associated surrounding increased uptake, likely  inflammatory. Moderate right and mild left leg subcutaneous edema with mild  increased uptake, possibly inflammatory.     CUTANEOUS/SUBCUTANEOUS:   Tiny cutaneous foci of uptake in the lateral aspect of the right hemipelvis  (images 213-222), for example, on image 220: up to 0.2 cm in thickness with  SUV max up to 3.3     FDG avid subcutaneous nodules, with reference examples as follows:  *  Right inguinal/proximal right thigh subcutaneous soft tissue nodule  (image 268): 1.9 x 2.2 cm with SUV max 6.6  *  Proximal right thigh anteromedial subcutaneous soft tissue nodule  (image 279): 2.7 x 3.6 cm with SUV max 14.7     Multiple cutaneous foci of uptake in the right lower extremity, below the  knee, with reference examples as follows:  *  Curvilinear cutaneous uptake along the anterolateral mid right leg  (image 427): 0.5 cm in thickness with SUV max 5.1  *  Cutaneous uptake within the anteromedial aspect of the mid right leg  (image 431): 0.2 cm in thickness with SUV max 5.2  *  Cutaneous uptake along the medial and posteromedial right leg (images  449-477): up to 0.4 cm in thickness with SUV max up to 7.7     Cutaneous/subcutaneous foci of uptake within the left lower extremity, as  follows:  *  Cutaneous focus of uptake within the posterior distal left thigh  (image 349): 0.3 cm in thickness with SUV max 3.5  *  At least 3 foci of cutaneous uptake lateral to the left knee (image  [phone]), for example on  image 372: up to 0.4 cm in thickness with SUV max  up to 9.3  *  Cutaneous/subcutaneous focus of uptake within the mid left leg (image  425): 0.5 x 0.3 cm with SUV max 4.7     IMPRESSION:      1. FDG avid cutaneous foci of uptake within the bilateral lower  extremities, particularly at and below the knees, is consistent with  malignancy.  2. FDG avid subcutaneous nodules within the right inguinal/right proximal  thigh region is consistent with malignancy.  3. FDG avid right inguinal and right external iliac lymph nodes are  suspicious for nodal metastatic disease.    Assessment & Plan:   Pt is a FST41 female with Stage 1B CTCL, MF subtype    Stage 1B CTCL, MF subtype  - Morphologic changes and LAN seen at last OV.   - Biopsy x 2 performed last week to eval for LCT.  Will ask to add CD30 status  - PET CT shows diffuse skin lesions and dermatopathic vs. Early malignancy in skin draining inguinal nodes  - Flow, TCR, CBC without evidence of blood tumor burden  - Plan for either spot radiation in setting of continued NBUVB vs. Low cumulative dose of brentuximab  - Multi-D follow up with med-onc, rad-onc and derm.    Time - 21 min    Ferne Reus, MD FAAD  Director, Cutaneous Lymphoma and  High Risk/Transplant Dermatology  [facility]   [address]  [place], [place],  [address]  (p) [phone]  (f) [phone]

## 2021-11-19 NOTE — Progress Notes (Signed)
Called the patient prior to her telephone visit with Dr. Bryson Ha and verified her information in the presence of her daughter that I also spoke with.   Patient verbalized consent for the telephone visit.

## 2021-11-24 ENCOUNTER — Other Ambulatory Visit: Payer: Self-pay | Admitting: Dermatology

## 2021-11-24 DIAGNOSIS — C8400 Mycosis fungoides, unspecified site: Secondary | ICD-10-CM

## 2021-11-24 DIAGNOSIS — C8599 Non-Hodgkin lymphoma, unspecified, extranodal and solid organ sites: Secondary | ICD-10-CM

## 2021-12-11 ENCOUNTER — Telehealth: Payer: Self-pay | Admitting: Dermatology

## 2021-12-11 NOTE — Telephone Encounter (Signed)
CONFIRMED APPT. REMINDED PT OF APPT DETAILS AND 15 MIN PRIOR ARRIVAL

## 2021-12-13 NOTE — Progress Notes (Signed)
HISTORY OF PRESENT ILLNESS    [name] Wall is 88 y.o. 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  She was previously treated with NBUVB.  I met her in November 2022 when she relocated to [facility] and noted Stage 1B, limited thin patch disease.  The patient is post-CVA and in her 90's making phototherapy challenging but doable.  I recommended topical imiquimod MWF.  She used this with a brisk inflammatory response.  She has held imiquimod since 10/01/21.  Using TAC 0.1% prn.  Receiving NBUVB 6+ minutes per session twice weekly.     In early January 2023, her daughter noticed 3 new lesions which are much more raised.  She presents earlier than scheduled for urgent examination.  The MD at [facility] (phototherapy site) performed 2 punch biopsies of the new thick plaques last week.  Diagnosis was tumor stage MF, no LCT, scattered CD 30+.  She has developed an open wound at the site of a tumor on the distal right leg.  This is painful.     At her last OV, I noted a morphologic change to plaques/tumors on LEs and new inguinal LAN. Ordered flow and PET.  Call today to review results.     No history of other skin cancers. Negative family history of skin cancer and lymphoma.       Current Outpatient Medications:     ALPRAZolam (XANAX) 0.25 MG tablet, Take 1 tablet (0.25 mg) by mouth daily as needed for Anxiety, Disp: 30 tablet, Rfl: 0    amLODIPine (NORVASC) 10 MG tablet, Take 1 tablet (10 mg) by mouth daily, Disp: , Rfl:     aspirin 81 MG EC tablet, Take 1 tablet (81 mg) by mouth, Disp: , Rfl:     atorvastatin (LIPITOR) 40 MG tablet, Take 1 tablet (40 mg) by mouth every 24 hours, Disp: , Rfl:     ferrous gluconate (FERGON) 324 MG tablet, Take 1 tablet (324 mg) by mouth every morning with breakfast, Disp: 90 tablet, Rfl: 3    hydrALAZINE (APRESOLINE) 50 MG tablet, Take 1 tablet (50 mg) by mouth 3 (three) times daily with meals, Disp: , Rfl:     hydrOXYzine (ATARAX) 10 MG tablet, Take 1 tablet (10 mg) by mouth every  8 (eight) hours as needed for Itching, Disp: 30 tablet, Rfl: 1    imiquimod (ALDARA) 5 % cream, Apply nightly three times a week (Monday, Wednesday, Friday) to behind left knee and right ankle, Disp: 96 each, Rfl: 2    latanoprost (XALATAN) 0.005 % ophthalmic solution, INSTILL 1 DROP INTO EACH EYE AT BEDTIME, Disp: , Rfl:     levETIRAcetam (KEPPRA) 500 MG tablet, Take 1 tablet (500 mg) by mouth 2 (two) times daily, Disp: , Rfl:     levothyroxine (SYNTHROID) 25 MCG tablet, Take 1 tablet (25 mcg) by mouth daily, Disp: , Rfl:     losartan (COZAAR) 50 MG tablet, Take 1 tablet (50 mg) by mouth daily (Patient not taking: Reported on 08/25/2021), Disp: , Rfl:     pantoprazole (PROTONIX) 40 MG tablet, Take 1 tablet (40 mg) by mouth daily, Disp: 90 tablet, Rfl: 1    triamcinolone (KENALOG) 0.1 % ointment, as needed, Disp: , Rfl:       Allergies   Allergen Reactions    Penicillins      No past medical history on file.  No past surgical history on file.  No family history on file.  Social History     Socioeconomic  History    Marital status: Widowed   Tobacco Use    Smoking status: Never    Smokeless tobacco: Never   Vaping Use    Vaping status: Never Used   Substance and Sexual Activity    Alcohol use: Not Currently    Drug use: Never    Sexual activity: Not Currently     PHYSICAL EXAM:  There were no vitals filed for this visit.  Note.General appearance: NAD, conversant.A total body skin examination is completed including palpation of scalp and inspection of hair of scalp, eyebrows, face, chest and extremities and inspection of eccrine and apocrine glands. The following anatomic skin sites are examined with findings recorded:  Head including face - no lesions of concern  Neck - no lesions of concern  Chest including breasts, and axillae - right axilla with a 2.8cm indurated plaque  Abdomen - no lesions of concern  Genitalia, groin, buttocks - thin hyperpigmented patches  Back - thin hyperpigmented patches  Right upper  extremity - thin hyperpigmented patches  Left upper extremity - thin hyperpigmented patches  Right lower extremity -  posterior leg with a 3cm indurated plaque with nodular edge; posteriomedial ankle with a 2.6cm draining eroded tumor; thin hyperpigmented patches with a plaque right ankle  Left lower extremity - lateral knee with a 3.3cm indurated plaque with nodular edge; thin hyperpigmented patches with a plaque left popliteal fossa     Examination of lymph node basins is completed, including the H and N, axilla and groin. There is no lymphadenopathy.    Pathology reports     07/14/18        Procedure Date & Time: 11/03/2021, 09:00                              FLOW CYTOMETRY REPORT          FLOW INTERPRETATION:          PERIPHERAL BLOOD: NO IMMUNOPHENOTYPIC ABNORMALITIES IDENTIFIED          COMMENT:     A population (12%) of lymphocytes is detected. About 53% of these cells     are CD3+ T cells and 30% are NK cells. Expressed as a percentage of     total T cells, CD4+/CD7-T cells are 4.0%, and CD4+/CD26-T cells are     3.9%. The calculated CD4/CD8 ratio is about 5.7. 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 Correlation with     morphology, clinical findings and other laboratory studies is     recommended for a complete evaluation.        PET CT WHOLE BODY     HISTORY: Mycosis fungoides, primarily cutaneous lymphoma (diagnosed in  2019). Post imiquimod, which has been held since 10/01/2021. Restaging.     TECHNIQUE:      Dose: 9.99  mCi F-18 fluorodeoxyglucose (F-18 FDG)  Serum Glucose: 104 mg/dL  Time from Z-61 FDG injection to scan: 62 min     The patient fasted prior to this examination for greater than six hours.  Positron emission tomography (PET) images were obtained from the convexity  of the calvarium through the feet. As part of this examination, a  nondiagnostic CT scan without intravenous contrast was obtained over the  same region utilizing a low tube current technique for  anatomical  correlation and attenuation correction. Oral contrast was provided. Images  were reconstructed in the axial,  sagittal and coronal planes.     CORRELATION: None available.     FINDINGS:      Mediastinum: SUV max 2.4  Liver: SUV max 3.2     HEAD AND NECK: Normal FDG distribution within the nasopharynx, oropharynx,  larynx and small sized thyroid gland. Limited evaluation of the brain  parenchyma demonstrates no discrete focus of increased FDG uptake. No FDG  avid cervical or supraclavicular lymph nodes. Minimal leftward deviation of  the nasal septum.     THORAX: No hypermetabolic foci within the lung parenchyma. Mildly FDG avid  subcentimeter bilateral axillary lymph nodes, likely inflammatory/reactive.  No other FDG avid thoracic lymph nodes. Coronary artery calcifications.  Aortic valvular calcifications. Small hiatal hernia. Bibasilar bandlike  linear atelectasis or scarring.     ABDOMEN AND PELVIS:   *  FDG avid right external iliac node (image 239): 0.4 x 0.7 cm with SUV  max 4.7  *  FDG avid right inguinal nodes, for example on image 243: up to 1.5 x  0.7 cm with SUV max up to 3.4     Mildly FDG avid subcentimeter left inguinal lymph nodes, likely  reactive/inflammatory.     Normal FDG distribution within the liver, spleen, mild atrophic pancreas,  and adrenal glands. Cholelithiasis. Non-FDG avid exophytic right renal  cyst, possibly containing a septation, measuring 6.0 x 4.9 cm (image 190).  Colonic diverticula, especially within the sigmoid colon, without evidence  of diverticulitis. Multiple pelvic phleboliths.     Below the knee misregistration somewhat limits evaluation.  MUSCULOSKELETAL/EXTREMITIES:   No hypermetabolic foci within the bone marrow. Multilevel spinal  degenerative changes. No aggressive lytic or sclerotic lesion. Right total  hip arthroplasty, with associated surrounding increased uptake, likely  inflammatory. Moderate right and mild left leg subcutaneous edema with  mild  increased uptake, possibly inflammatory.     CUTANEOUS/SUBCUTANEOUS:   Tiny cutaneous foci of uptake in the lateral aspect of the right hemipelvis  (images 213-222), for example, on image 220: up to 0.2 cm in thickness with  SUV max up to 3.3     FDG avid subcutaneous nodules, with reference examples as follows:  *  Right inguinal/proximal right thigh subcutaneous soft tissue nodule  (image 268): 1.9 x 2.2 cm with SUV max 6.6  *  Proximal right thigh anteromedial subcutaneous soft tissue nodule  (image 279): 2.7 x 3.6 cm with SUV max 14.7     Multiple cutaneous foci of uptake in the right lower extremity, below the  knee, with reference examples as follows:  *  Curvilinear cutaneous uptake along the anterolateral mid right leg  (image 427): 0.5 cm in thickness with SUV max 5.1  *  Cutaneous uptake within the anteromedial aspect of the mid right leg  (image 431): 0.2 cm in thickness with SUV max 5.2  *  Cutaneous uptake along the medial and posteromedial right leg (images  449-477): up to 0.4 cm in thickness with SUV max up to 7.7     Cutaneous/subcutaneous foci of uptake within the left lower extremity, as  follows:  *  Cutaneous focus of uptake within the posterior distal left thigh  (image 349): 0.3 cm in thickness with SUV max 3.5  *  At least 3 foci of cutaneous uptake lateral to the left knee (image  [phone]), for example on image 372: up to 0.4 cm in thickness with SUV max  up to 9.3  *  Cutaneous/subcutaneous focus of uptake within the mid left leg (image  425): 0.5 x 0.3 cm with SUV max 4.7     IMPRESSION:      1. FDG avid cutaneous foci of uptake within the bilateral lower  extremities, particularly at and below the knees, is consistent with  malignancy.  2. FDG avid subcutaneous nodules within the right inguinal/right proximal  thigh region is consistent with malignancy.  3. FDG avid right inguinal and right external iliac lymph nodes are  suspicious for nodal metastatic disease.    Assessment &  Plan:   Pt is a 63FST7 female with Stage 2B CTCL (T3, N1, M0, B0)  MF subtype    Stage 2B CTCL, MF subtype  - Morphologic changes and LAN emerged February 2023  -Biopsy shows CD30+ tumors  - PET CT shows diffuse skin lesions and dermatopathic vs. Early malignancy in skin draining inguinal nodes  - Flow, TCR, CBC without evidence of blood tumor burden  - Low cumulative dose of brentuximab, will aim for 1st infusion 12/29/21  - Continue skin directed NBUVB  - Multi-D follow up     2. Open wound  - Eroded draining tumor on right leg  - Wound care consult      Ferne ReusJennifer Irfan Veal, MD FAAD  Director, Cutaneous Lymphoma and  High Risk/Transplant Dermatology  [facility]   [address]  [place], [place], [id number]  [phone](p) [phone]  [phone](f) [phone]

## 2021-12-14 ENCOUNTER — Encounter: Payer: Self-pay | Admitting: Student in an Organized Health Care Education/Training Program

## 2021-12-14 ENCOUNTER — Ambulatory Visit (INDEPENDENT_AMBULATORY_CARE_PROVIDER_SITE_OTHER): Payer: Medicare Other | Admitting: Student in an Organized Health Care Education/Training Program

## 2021-12-14 ENCOUNTER — Other Ambulatory Visit: Payer: Self-pay

## 2021-12-14 ENCOUNTER — Ambulatory Visit: Payer: Medicare Other | Attending: Dermatology | Admitting: Dermatology

## 2021-12-14 ENCOUNTER — Telehealth: Payer: Self-pay

## 2021-12-14 ENCOUNTER — Encounter: Payer: Self-pay | Admitting: Dermatology

## 2021-12-14 VITALS — BP 178/71 | HR 77 | Temp 97.5°F | Resp 17 | Ht 60.0 in | Wt 135.0 lb

## 2021-12-14 VITALS — BP 178/71 | HR 77 | Temp 97.6°F | Resp 18 | Ht 60.0 in | Wt 135.0 lb

## 2021-12-14 DIAGNOSIS — C8405 Mycosis fungoides, lymph nodes of inguinal region and lower limb: Secondary | ICD-10-CM | POA: Insufficient documentation

## 2021-12-14 DIAGNOSIS — C8599 Non-Hodgkin lymphoma, unspecified, extranodal and solid organ sites: Secondary | ICD-10-CM

## 2021-12-14 DIAGNOSIS — Z9189 Other specified personal risk factors, not elsewhere classified: Secondary | ICD-10-CM

## 2021-12-14 DIAGNOSIS — E785 Hyperlipidemia, unspecified: Secondary | ICD-10-CM | POA: Insufficient documentation

## 2021-12-14 DIAGNOSIS — Z1159 Encounter for screening for other viral diseases: Secondary | ICD-10-CM

## 2021-12-14 DIAGNOSIS — Z114 Encounter for screening for human immunodeficiency virus [HIV]: Secondary | ICD-10-CM

## 2021-12-14 DIAGNOSIS — I69351 Hemiplegia and hemiparesis following cerebral infarction affecting right dominant side: Secondary | ICD-10-CM | POA: Insufficient documentation

## 2021-12-14 DIAGNOSIS — S81801A Unspecified open wound, right lower leg, initial encounter: Secondary | ICD-10-CM

## 2021-12-14 DIAGNOSIS — C84A5 Cutaneous T-cell lymphoma, unspecified, lymph nodes of inguinal region and lower limb: Secondary | ICD-10-CM | POA: Insufficient documentation

## 2021-12-14 DIAGNOSIS — C8400 Mycosis fungoides, unspecified site: Secondary | ICD-10-CM

## 2021-12-14 DIAGNOSIS — I10 Essential (primary) hypertension: Secondary | ICD-10-CM | POA: Insufficient documentation

## 2021-12-14 NOTE — Telephone Encounter (Signed)
Called patient to review upcoming appointment times for 12/16/2021 and 12/29/2021 for wound care and Brentixumab infusion, respectively.  Spoke with daughter, [name] Wall.  Reviewed wound care appointment (date/time/location) and Brentixumab lab, clinic and infusion appointment; daughter verbalized understanding.  Daughter also provided triage phone number.  Daughter instructed to call Wound Care clinic at [phone] to answer additional questions per scheduler.  No additional needs identified at this time; daughter encouraged to call with any new questions or concerns.

## 2021-12-14 NOTE — Progress Notes (Signed)
[facility]       Date Time: December 14, 2021   Patient Name: [name] Wall,[name] Wall   Referring Provider: Layne Benton, MD    Primary Dx: Mycosis Fungoides, stage 2B      ONCOLOGY CARE TEAM  Dermatology: [name] Dar, MD  Derm Oncology: [name] Reus, MD  Radiation Oncology:  [name] Mulling, MD      HISTORY OF PRESENT ILLNESS  [name] Wall is 88 y.o. female h/o HTN, HLD, CVA with chronic R-sided weakness, and Mycosis Fungoides since 2019.  She was previously treated with NBUVB.  I met her in November 2022 when she relocated to [facility] and noted Stage 1B, limited thin patch disease.  The patient is post-CVA and in her 90's making phototherapy challenging but doable.  I recommended topical imiquimod MWF.  She used this with a brisk inflammatory response.  She has held imiquimod since 10/01/21.  Using TAC 0.1% prn.  Receiving NBUVB 6+ minutes per session twice weekly.     In early January 2023, her daughter noticed 3 new lesions which are much more raised.  At her OV with Dr. Bryson Wall shortly thereafter, she noted a morphologic change to plaques/tumors on LEs and new inguinal LAN. The MD at [facility] (phototherapy site) performed 2 punch biopsies of the new thick plaques last week, which revealed advancement of disease to tumor stage (stage 2B). Pathology noted scattered CD30 positivity (% not reported). Peripheral blood flow cytometry with no immunophenotypic changes, does not meet thresholds for leukemic disease but +TCR.     Meeting today with myself and Dr. Bryson Wall for a dual visit to discuss potential systemic therapies vs focal radiation for her worsening CTCL. Presents in the company of her daughter, [name] Wall.    No history of other skin cancers. Negative family history of skin cancer and lymphoma.     MEDICATIONS:  Current Outpatient Medications:     ALPRAZolam (XANAX) 0.25 MG tablet, Take 1 tablet (0.25 mg) by mouth daily as needed for Anxiety, Disp: 30 tablet, Rfl: 0     amLODIPine (NORVASC) 10 MG tablet, Take 1 tablet (10 mg) by mouth daily, Disp: , Rfl:     aspirin 81 MG EC tablet, Take 1 tablet (81 mg) by mouth, Disp: , Rfl:     atorvastatin (LIPITOR) 40 MG tablet, Take 1 tablet (40 mg) by mouth every 24 hours, Disp: , Rfl:     ferrous gluconate (FERGON) 324 MG tablet, Take 1 tablet (324 mg) by mouth every morning with breakfast, Disp: 90 tablet, Rfl: 3    gentamicin (GARAMYCIN) 0.1 % ointment, APPLY UP TO TWICE A DAY ON CRUSTED AREAS UNTIL RESOLVED, Disp: , Rfl:     hydrALAZINE (APRESOLINE) 50 MG tablet, Take 1 tablet (50 mg) by mouth 3 (three) times daily with meals, Disp: , Rfl:     hydrOXYzine (ATARAX) 10 MG tablet, Take 1 tablet (10 mg) by mouth every 8 (eight) hours as needed for Itching, Disp: 30 tablet, Rfl: 1    imiquimod (ALDARA) 5 % cream, Apply nightly three times a week (Monday, Wednesday, Friday) to behind left knee and right ankle, Disp: 96 each, Rfl: 2    latanoprost (XALATAN) 0.005 % ophthalmic solution, INSTILL 1 DROP INTO EACH EYE AT BEDTIME, Disp: , Rfl:     levETIRAcetam (KEPPRA) 500 MG tablet, Take 1 tablet (500 mg) by mouth 2 (two) times daily, Disp: , Rfl:     levothyroxine (SYNTHROID) 25 MCG tablet, Take 1 tablet (25  mcg) by mouth daily, Disp: , Rfl:     losartan (COZAAR) 50 MG tablet, Take 50 mg by mouth daily, Disp: , Rfl:     pantoprazole (PROTONIX) 40 MG tablet, Take 1 tablet (40 mg) by mouth daily, Disp: 90 tablet, Rfl: 1    SSD 1 % cream, APPLY TWICE A DAY TO ULCER ON LEG FOR 2 WEEKS. COVER WITH BANDAID AFTER, Disp: , Rfl:     tacrolimus (PROTOPIC) 0.1 % ointment, Apply topically, Disp: , Rfl:     triamcinolone (KENALOG) 0.1 % cream, Apply topically, Disp: , Rfl:     triamcinolone (KENALOG) 0.1 % ointment, as needed, Disp: , Rfl:     Allergies   Allergen Reactions    Penicillins      Past Medical History:   Diagnosis Date    Hypercholesteremia     Hypertension     Seizures     Stroke      Past Surgical History:   Procedure Laterality Date    HAND,  CLOSED REDUCTION  01/29/2019    right wrist     Family History   Problem Relation Age of Onset    Breast cancer Sister      Social History     Socioeconomic History    Marital status: Widowed   Tobacco Use    Smoking status: Never    Smokeless tobacco: Never   Vaping Use    Vaping status: Never Used   Substance and Sexual Activity    Alcohol use: Not Currently    Drug use: Never    Sexual activity: Not Currently     PHYSICAL EXAM:  Vitals:    12/14/21 0941   BP: 178/71   Pulse: 77   Resp: 18   Temp: 97.6 F (36.4 C)   SpO2: 100%     ECOG PS: 1-2  General: NAD, seated in wheelchair, very slow in standing up or walking  Eyes: Anicteric, normal conjunctiva  ENT: No sores in mouth  CV: RRR  RESP: CTA B  GI: NT/ND, no HSM  Neuro: AAO x 3, slow/stuttering gait, R > L weakness  MS: no swelling in arms or legs  SKIN: thin hyperpigmented patches and plaques throughout b/l LE, R>L; some on R flank, R shoulder and R forearm  - Right lower extremity - right inferior groin/superior thigh with a 6cm subcutaneous mass;  posterior leg with a 3cm indurated plaque with nodular edge; thin hyperpigmented patches with a plaque right ankle, open ulcer at distal R posterior ankle  - Left lower extremity - lateral knee with a 3.3cm indurated plaque with nodular edge; thin hyperpigmented patches with a plaque left popliteal fossa  LYMPH: negative cervical suplarclavicular axillary     12/14/21              IMAGING:  PET CT WHOLE BODY  FINDINGS:   Mediastinum: SUV max 2.4  Liver: SUV max 3.2     HEAD AND NECK: Normal FDG distribution within the nasopharynx, oropharynx,  larynx and small sized thyroid gland. Limited evaluation of the brain  parenchyma demonstrates no discrete focus of increased FDG uptake. No FDG  avid cervical or supraclavicular lymph nodes. Minimal leftward deviation of  the nasal septum.     THORAX: No hypermetabolic foci within the lung parenchyma. Mildly FDG avid  subcentimeter bilateral axillary lymph nodes, likely  inflammatory/reactive.  No other FDG avid thoracic lymph nodes. Coronary artery calcifications.  Aortic valvular calcifications. Small hiatal hernia. Bibasilar bandlike  linear atelectasis or scarring.     ABDOMEN AND PELVIS:   *  FDG avid right external iliac node (image 239): 0.4 x 0.7 cm with SUV  max 4.7  *  FDG avid right inguinal nodes, for example on image 243: up to 1.5 x  0.7 cm with SUV max up to 3.4     Mildly FDG avid subcentimeter left inguinal lymph nodes, likely  reactive/inflammatory.     Normal FDG distribution within the liver, spleen, mild atrophic pancreas,  and adrenal glands. Cholelithiasis. Non-FDG avid exophytic right renal  cyst, possibly containing a septation, measuring 6.0 x 4.9 cm (image 190).  Colonic diverticula, especially within the sigmoid colon, without evidence  of diverticulitis. Multiple pelvic phleboliths.     Below the knee misregistration somewhat limits evaluation.  MUSCULOSKELETAL/EXTREMITIES:   No hypermetabolic foci within the bone marrow. Multilevel spinal  degenerative changes. No aggressive lytic or sclerotic lesion. Right total  hip arthroplasty, with associated surrounding increased uptake, likely  inflammatory. Moderate right and mild left leg subcutaneous edema with mild  increased uptake, possibly inflammatory.     CUTANEOUS/SUBCUTANEOUS:   Tiny cutaneous foci of uptake in the lateral aspect of the right hemipelvis  (images 213-222), for example, on image 220: up to 0.2 cm in thickness with  SUV max up to 3.3     FDG avid subcutaneous nodules, with reference examples as follows:  *  Right inguinal/proximal right thigh subcutaneous soft tissue nodule  (image 268): 1.9 x 2.2 cm with SUV max 6.6  *  Proximal right thigh anteromedial subcutaneous soft tissue nodule  (image 279): 2.7 x 3.6 cm with SUV max 14.7     Multiple cutaneous foci of uptake in the right lower extremity, below the  knee, with reference examples as follows:  *  Curvilinear cutaneous uptake along  the anterolateral mid right leg  (image 427): 0.5 cm in thickness with SUV max 5.1  *  Cutaneous uptake within the anteromedial aspect of the mid right leg  (image 431): 0.2 cm in thickness with SUV max 5.2  *  Cutaneous uptake along the medial and posteromedial right leg (images  449-477): up to 0.4 cm in thickness with SUV max up to 7.7     Cutaneous/subcutaneous foci of uptake within the left lower extremity, as  follows:  *  Cutaneous focus of uptake within the posterior distal left thigh  (image 349): 0.3 cm in thickness with SUV max 3.5  *  At least 3 foci of cutaneous uptake lateral to the left knee (image  [phone]), for example on image 372: up to 0.4 cm in thickness with SUV max  up to 9.3  *  Cutaneous/subcutaneous focus of uptake within the mid left leg (image  425): 0.5 x 0.3 cm with SUV max 4.7     IMPRESSION:   1. FDG avid cutaneous foci of uptake within the bilateral lower  extremities, particularly at and below the knees, is consistent with  malignancy.  2. FDG avid subcutaneous nodules within the right inguinal/right proximal  thigh region is consistent with malignancy.  3. FDG avid right inguinal and right external iliac lymph nodes are  suspicious for nodal metastatic disease.    PATHOLOGY    07/14/18            Procedure Date & Time: 11/03/2021, 09:00  FLOW CYTOMETRY REPORT          FLOW INTERPRETATION:          PERIPHERAL BLOOD: NO IMMUNOPHENOTYPIC ABNORMALITIES IDENTIFIED          COMMENT:     A population (12%) of lymphocytes is detected. About 53% of these cells     are CD3+ T cells and 30% are NK cells. Expressed as a percentage of     total T cells, CD4+/CD7-T cells are 4.0%, and CD4+/CD26-T cells are     3.9%. The calculated CD4/CD8 ratio is about 5.7. 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 Correlation with     morphology, clinical findings and other laboratory studies is     recommended for a complete evaluation.            Assessment & Plan:   Verlia Kaney is 88 y.o. FST5 female h/o HTN, HLD, CVA with chronic R-sided weakness, and Mycosis Fungoides since 2019. She recently developed new nodules and lymphadenopathy with biopsy proven advancement to tumor stage disease, Stage 2B CTCL, MF subtype. Given scattered CD30+ disease, we recommend patient to receive brentuximab. We reviewed the dose (1.8mg /kg, with max 180mg ), schedule (every 3 weeks), route (IV), potential benefits and side effects (including but not limited to neutropenia, neuropathy). Given her advanced age and comorbidities, we will plan for a very limited series of treatments (1-3 doses). Patient agreed and consents signed today.       #Stage 2B CTCL, MF subtype  - PET CT shows diffuse skin lesions and dermatopathic vs. Early malignancy in skin draining inguinal nodes  - Flow, TCR, CBC without evidence of blood tumor burden  - Systemic Treatment: Brentuximab 1.8mg /kg IV q3wks, plan for 1-3 doses    -- Obtain CBC, CMP before each infusion    -- C1D1 = 12/29/21 (planned)  - Continue concurrent skin-directed therapy (NBUVB) with primary derm Dr. Chandra Batch  - Appreciate excellent co-management with derm onc Dr. Bryson Wall  - Amb referral for wound care given open ulcer    RTC on 4/4        Marysue Fait Al-Mondhiry, MD, MA  Medical Oncologist, Cutaneous Malignancies Program  [facility]  [address]  [place], [place] [address]  Phone: [phone]  Fax: [phone]

## 2021-12-16 ENCOUNTER — Ambulatory Visit: Payer: Medicare Other | Attending: Student in an Organized Health Care Education/Training Program

## 2021-12-16 DIAGNOSIS — I10 Essential (primary) hypertension: Secondary | ICD-10-CM | POA: Insufficient documentation

## 2021-12-16 DIAGNOSIS — L97912 Non-pressure chronic ulcer of unspecified part of right lower leg with fat layer exposed: Secondary | ICD-10-CM | POA: Insufficient documentation

## 2021-12-16 DIAGNOSIS — E785 Hyperlipidemia, unspecified: Secondary | ICD-10-CM | POA: Insufficient documentation

## 2021-12-16 DIAGNOSIS — S81801A Unspecified open wound, right lower leg, initial encounter: Secondary | ICD-10-CM | POA: Insufficient documentation

## 2021-12-16 NOTE — Progress Notes (Signed)
cul

## 2021-12-16 NOTE — Addendum Note (Signed)
Addended by: Everitt Amber on: 12/16/2021 12:26 PM     Modules accepted: Orders

## 2021-12-18 ENCOUNTER — Other Ambulatory Visit: Payer: Self-pay | Admitting: Medical

## 2021-12-18 DIAGNOSIS — F419 Anxiety disorder, unspecified: Secondary | ICD-10-CM

## 2021-12-18 MED ORDER — CEFUROXIME AXETIL 250 MG PO TABS
250.0000 mg | ORAL_TABLET | Freq: Two times a day (BID) | ORAL | 0 refills | Status: AC
Start: 2021-12-18 — End: 2021-12-28

## 2021-12-22 NOTE — Addendum Note (Signed)
Addended by: Synthia Innocent. on: 12/22/2021 02:24 PM     Modules accepted: Orders

## 2021-12-23 ENCOUNTER — Ambulatory Visit: Payer: Medicare Other

## 2021-12-28 ENCOUNTER — Telehealth: Payer: Self-pay

## 2021-12-28 NOTE — Telephone Encounter (Signed)
Spoke with daughter [name].  Dr. Crosby Oyster will see patient tomorrow prior to C1D1; daughter verbalized understanding.

## 2021-12-28 NOTE — Telephone Encounter (Signed)
Called daughter to explain that Dr. Dickey Gave is not available tomorrow for a check-in prior to first infusion.  Explained to daughter patient should get labs and then proceed to 9th floor for infusion.  However daughter would like a clinician to examine patient's leg prior to first infusion (per daughter, while it is healing, it has been slow).  RN will reach out to Dr. Dickey Gave and Dr. Crosby Oyster for guidance about scheduling check-in.  Will reach out to daughter with plan.

## 2021-12-29 ENCOUNTER — Ambulatory Visit
Payer: Medicare Other | Attending: Student in an Organized Health Care Education/Training Program | Admitting: Hematology & Oncology

## 2021-12-29 ENCOUNTER — Ambulatory Visit: Payer: Medicare Other | Admitting: Student in an Organized Health Care Education/Training Program

## 2021-12-29 ENCOUNTER — Ambulatory Visit: Payer: Medicare Other

## 2021-12-29 ENCOUNTER — Other Ambulatory Visit: Payer: Self-pay | Admitting: Hematology & Oncology

## 2021-12-29 ENCOUNTER — Encounter: Payer: Self-pay | Admitting: Hematology & Oncology

## 2021-12-29 ENCOUNTER — Other Ambulatory Visit: Payer: Self-pay

## 2021-12-29 ENCOUNTER — Other Ambulatory Visit: Payer: Medicare Other

## 2021-12-29 ENCOUNTER — Other Ambulatory Visit: Payer: Self-pay | Admitting: Student in an Organized Health Care Education/Training Program

## 2021-12-29 VITALS — BP 153/63 | HR 74

## 2021-12-29 VITALS — BP 157/68 | HR 84 | Temp 97.7°F | Resp 16 | Ht 60.0 in | Wt 119.0 lb

## 2021-12-29 DIAGNOSIS — C8405 Mycosis fungoides, lymph nodes of inguinal region and lower limb: Secondary | ICD-10-CM

## 2021-12-29 DIAGNOSIS — Z1159 Encounter for screening for other viral diseases: Secondary | ICD-10-CM

## 2021-12-29 DIAGNOSIS — C8400 Mycosis fungoides, unspecified site: Secondary | ICD-10-CM

## 2021-12-29 DIAGNOSIS — Z5112 Encounter for antineoplastic immunotherapy: Secondary | ICD-10-CM | POA: Insufficient documentation

## 2021-12-29 DIAGNOSIS — Z114 Encounter for screening for human immunodeficiency virus [HIV]: Secondary | ICD-10-CM

## 2021-12-29 LAB — COMPREHENSIVE METABOLIC PANEL
ALT: 15 U/L (ref 0–55)
AST (SGOT): 27 U/L (ref 5–41)
Albumin/Globulin Ratio: 0.9 (ref 0.9–2.2)
Albumin: 3.9 g/dL (ref 3.5–5.0)
Alkaline Phosphatase: 87 U/L (ref 37–117)
Anion Gap: 11 (ref 5.0–15.0)
BUN: 15 mg/dL (ref 7.0–21.0)
Bilirubin, Total: 0.4 mg/dL (ref 0.2–1.2)
CO2: 26 mEq/L (ref 17–29)
Calcium: 10 mg/dL (ref 7.9–10.2)
Chloride: 104 mEq/L (ref 99–111)
Creatinine: 1 mg/dL (ref 0.4–1.0)
Globulin: 4.5 g/dL — ABNORMAL HIGH (ref 2.0–3.6)
Glucose: 142 mg/dL — ABNORMAL HIGH (ref 70–100)
Potassium: 4 mEq/L (ref 3.5–5.3)
Protein, Total: 8.4 g/dL — ABNORMAL HIGH (ref 6.0–8.3)
Sodium: 141 mEq/L (ref 135–145)

## 2021-12-29 LAB — CBC AND DIFFERENTIAL
Absolute NRBC: 0 10*3/uL (ref 0.00–0.00)
Basophils Absolute Automated: 0.03 10*3/uL (ref 0.00–0.08)
Basophils Automated: 0.5 %
Eosinophils Absolute Automated: 0.12 10*3/uL (ref 0.00–0.44)
Eosinophils Automated: 2.1 %
Hematocrit: 36.1 % (ref 34.7–43.7)
Hgb: 11.8 g/dL (ref 11.4–14.8)
Immature Granulocytes Absolute: 0.02 10*3/uL (ref 0.00–0.07)
Immature Granulocytes: 0.3 %
Instrument Absolute Neutrophil Count: 4.44 10*3/uL (ref 1.10–6.33)
Lymphocytes Absolute Automated: 0.82 10*3/uL (ref 0.42–3.22)
Lymphocytes Automated: 14.1 %
MCH: 25.7 pg (ref 25.1–33.5)
MCHC: 32.7 g/dL (ref 31.5–35.8)
MCV: 78.5 fL (ref 78.0–96.0)
MPV: 9.7 fL (ref 8.9–12.5)
Monocytes Absolute Automated: 0.39 10*3/uL (ref 0.21–0.85)
Monocytes: 6.7 %
Neutrophils Absolute: 4.44 10*3/uL (ref 1.10–6.33)
Neutrophils: 76.3 %
Nucleated RBC: 0 /100 WBC (ref 0.0–0.0)
Platelets: 291 10*3/uL (ref 142–346)
RBC: 4.6 10*6/uL (ref 3.90–5.10)
RDW: 15 % (ref 11–15)
WBC: 5.82 10*3/uL (ref 3.10–9.50)

## 2021-12-29 LAB — HEPATITIS C ANTIBODY: Hepatitis C, AB: NONREACTIVE

## 2021-12-29 LAB — HEPATITIS B SURFACE ANTIGEN W/ REFLEX TO CONFIRMATION: Hepatitis B Surface Antigen: NONREACTIVE

## 2021-12-29 LAB — HEPATITIS B SURFACE ANTIBODY: HEPATITIS B SURFACE ANTIBODY: <3.31 m[IU]/mL

## 2021-12-29 LAB — GFR: EGFR: >60

## 2021-12-29 LAB — HEMOLYSIS INDEX: Hemolysis Index: 13 Index (ref 0–24)

## 2021-12-29 LAB — HIV-1/2 AG/AB 4TH GEN. W/ REFLEX: HIV Ag/Ab, 4th Generation: NONREACTIVE

## 2021-12-29 LAB — CORTISOL: Cortisol: 10.1 ug/dL

## 2021-12-29 LAB — HEPATITIS B CORE ANTIBODY, TOTAL: Hepatitis B Core Total AB: REACTIVE — AB

## 2021-12-29 MED ORDER — SODIUM CHLORIDE 0.9 % IV SOLN
1.8000 mg/kg | Freq: Once | INTRAVENOUS | Status: DC
Start: 2021-12-29 — End: 2021-12-29

## 2021-12-29 MED ORDER — ALBUTEROL SULFATE (2.5 MG/3ML) 0.083% IN NEBU
2.5000 mg | INHALATION_SOLUTION | Freq: Once | RESPIRATORY_TRACT | Status: DC | PRN
Start: 2021-12-29 — End: 2021-12-29

## 2021-12-29 MED ORDER — EPINEPHRINE HCL 1 MG/ML ADULT ANAPHYLAXIS KIT
0.3000 mg | Freq: Once | INTRAMUSCULAR | Status: DC | PRN
Start: 2021-12-29 — End: 2021-12-29

## 2021-12-29 MED ORDER — FAMOTIDINE 10 MG/ML IV SOLN (WRAP)
20.0000 mg | Freq: Once | INTRAVENOUS | Status: DC | PRN
Start: 2021-12-29 — End: 2021-12-29

## 2021-12-29 MED ORDER — DIPHENHYDRAMINE HCL 50 MG/ML IJ SOLN
25.0000 mg | Freq: Once | INTRAMUSCULAR | Status: DC | PRN
Start: 2021-12-29 — End: 2021-12-29

## 2021-12-29 MED ORDER — DIPHENHYDRAMINE HCL 50 MG/ML IJ SOLN
50.0000 mg | Freq: Once | INTRAMUSCULAR | Status: DC | PRN
Start: 2021-12-29 — End: 2021-12-29

## 2021-12-29 MED ORDER — SODIUM CHLORIDE 0.9 % IV SOLN
25.0000 mL/h | INTRAVENOUS | Status: DC | PRN
Start: 2021-12-29 — End: 2021-12-29

## 2021-12-29 MED ORDER — LORAZEPAM 2 MG/ML IJ SOLN
1.0000 mg | INTRAMUSCULAR | Status: DC | PRN
Start: 2021-12-29 — End: 2021-12-29

## 2021-12-29 MED ORDER — METHYLPREDNISOLONE SODIUM SUCC 40 MG IJ SOLR
40.0000 mg | Freq: Once | INTRAMUSCULAR | Status: DC | PRN
Start: 2021-12-29 — End: 2021-12-29

## 2021-12-29 MED ORDER — SODIUM CHLORIDE 0.9 % IV BOLUS
500.0000 mL | Freq: Once | INTRAVENOUS | Status: DC | PRN
Start: 2021-12-29 — End: 2021-12-29

## 2021-12-29 MED ORDER — LORAZEPAM 1 MG PO TABS
1.0000 mg | ORAL_TABLET | ORAL | Status: DC | PRN
Start: 2021-12-29 — End: 2021-12-29

## 2021-12-29 MED ORDER — SODIUM CHLORIDE 0.9 % IV SOLN
1.8000 mg/kg | Freq: Once | INTRAVENOUS | Status: AC
Start: 2021-12-29 — End: 2021-12-29
  Administered 2021-12-29: 97 mg via INTRAVENOUS
  Filled 2021-12-29: qty 19.4

## 2021-12-29 NOTE — Progress Notes (Signed)
[facility] - Adult Infusion   Visit Date: 12/29/2021        [name] Wall is a 88 y.o. female patient of Jafar Al-Mondhiry, MD    Since her last visit, she has been doing well.   Patient presents to the infusion center for Cycle 1 , Day 1 Mycosis.   Overall, she states her energy level is good.  The patient is able to perform most usual functions.. Her appetite is reported to be intact, with no significant changes in weight.. She reports no complaints. She has no other concerns. There are no social work or other referral needs at this time.    Patient was seen by Dr. Crosby Oyster prior to infusion.  Arrives via wheelchair and denies complaints.  Dosage reduced for weight decrease of 11.6%.  Ok to treat with CrCL 24.2 per Dr. Crosby Oyster.    Diagnosis: Mycosis  Treatment on Clinical Trial: Not On Study (NOS)  [facility] - OP ADULT - Lymphoma - Brentuximab    Line Type: Peripheral IV:  Right arm  Blood Return verified prior to administration: Yes  Vital Signs:  Patient Vitals for the past 12 hrs:   BP Pulse   12/29/21 1230 153/63 74      There is no height or weight on file to calculate BSA.    '  Chemistry        Component Value Date/Time    NA 141 12/29/2021 0955    K 4.0 12/29/2021 0955    CL 104 12/29/2021 0955    CO2 26 12/29/2021 0955    BUN 15.0 12/29/2021 0955    CREAT 1.0 12/29/2021 0955    GLU 142 (H) 12/29/2021 0955        Component Value Date/Time    CA 10.0 12/29/2021 0955    ALKPHOS 87 12/29/2021 0955    AST 27 12/29/2021 0955    ALT 15 12/29/2021 0955    BILITOTAL 0.4 12/29/2021 0955            Lab Results   Component Value Date    WBC 5.82 12/29/2021    HGB 11.8 12/29/2021    PLT 291 12/29/2021    NEUTROABS 4.44 12/29/2021       Administrations This Visit       brentuximab vedotin (ADCETRIS) 97 mg in sodium chloride 0.9 % 134.4 mL chemo infusion       Admin Date  12/29/2021  11:49 Action  New Bag Dose  97 mg Rate  268.8 mL/hr Route  Intravenous Ordering Provider  Tiney Rouge, MD                     Chemo Given within the past 12 hours:   "Chemo/Biotherapy Given" (last 12 hours)       Date/Time Action User Medication Dose Rate Dose Volume (mL) Rate    12/29/21 1219 Stopped    MC brentuximab vedotin (ADCETRIS) 97 mg in sodium chloride 0.9 % 134.4 mL chemo infusion  0 mL/hr *0 mg         12/29/21 1149 New Bag    MC brentuximab vedotin (ADCETRIS) 97 mg in sodium chloride 0.9 % 134.4 mL chemo infusion 97 mg 268.8 mL/hr *97 mg               Hypersensitivity Reaction Noted: No  IV Post Hydration: No, not required  Treatment Outcome:Patient tolerated treatment well.  Blood return verified after administration: Yes    Education:  Patient/Family educated regarding the expected outcomes/side effects possible interactions of treatments, medications, and procedures. Patient verbalizes understanding.    Follow-up Plan: Discharged in stable condition. Accompanied by, nursing staff. Instructed to contact physician if any complications or unmanageable symptoms occur.    Patient will follow-up with their physician.  A return to the Infusion Center for the next treatment, has been scheduled.    RTC 4/25  MD 4/4    Tomasa HostellerMegan B Malikai Gut, RN, BSN, OCN, CMSRN, GERO-BC    12/29/2021  1:34 PM

## 2021-12-29 NOTE — Addendum Note (Signed)
Addended by: Synthia Innocent. on: 12/29/2021 10:21 AM     Modules accepted: Orders

## 2021-12-29 NOTE — Addendum Note (Signed)
Addended by: Celesta Gentile L. on: 12/29/2021 10:26 AM     Modules accepted: Orders

## 2021-12-29 NOTE — Addendum Note (Signed)
Addended by: Synthia Innocent. on: 12/29/2021 08:10 AM     Modules accepted: Orders

## 2021-12-29 NOTE — Progress Notes (Signed)
[facility]       Date Time: December 29, 2021   Patient Name: [name] Wall,[name] Wall   Referring Provider: Layne Benton, MD    Primary Dx: Mycosis Fungoides, stage 2B      ONCOLOGY CARE TEAM  Dermatology: Russella Dar, MD  Derm Oncology: Ferne Reus, MD  Radiation Oncology:  Quentin Mulling, MD      HISTORY OF PRESENT ILLNESS  [name] Wall is 88 y.o. female h/o HTN, HLD, CVA with chronic R-sided weakness, and Mycosis Fungoides since 2019. She was previously treated with NBUVB.  She established care here at [facility] in November 2022 when she relocated to [facility] for MF. She developed tumor stage MF in Jan 2023 despite NVUBV, therefore, consensus recommendation was to treat with brentuximab.     She's here to commence brentuximab.     She continues to tolerate NVUBV. She has open skin lesion in right posterior ankle without bleeding or drainage. Continues wound care as outpatient.     She has left side weakness from CVA in 2022. Denies any peripheral neuropathy such as numbness or tingling.     No history of other skin cancers. Negative family history of skin cancer and lymphoma.     MEDICATIONS:    Current Outpatient Medications:     ALPRAZolam (XANAX) 0.25 MG tablet, Take 1 tablet (0.25 mg) by mouth daily as needed for Anxiety, Disp: 30 tablet, Rfl: 0    amLODIPine (NORVASC) 10 MG tablet, Take 1 tablet (10 mg) by mouth daily, Disp: , Rfl:     aspirin 81 MG EC tablet, Take 1 tablet (81 mg) by mouth, Disp: , Rfl:     atorvastatin (LIPITOR) 40 MG tablet, Take 1 tablet (40 mg) by mouth every 24 hours, Disp: , Rfl:     ferrous gluconate (FERGON) 324 MG tablet, Take 1 tablet (324 mg) by mouth every morning with breakfast, Disp: 90 tablet, Rfl: 3    gentamicin (GARAMYCIN) 0.1 % ointment, APPLY UP TO TWICE A DAY ON CRUSTED AREAS UNTIL RESOLVED, Disp: , Rfl:     hydrALAZINE (APRESOLINE) 50 MG tablet, Take 1 tablet (50 mg) by mouth 3 (three) times daily with meals, Disp: , Rfl:     imiquimod  (ALDARA) 5 % cream, Apply nightly three times a week (Monday, Wednesday, Friday) to behind left knee and right ankle, Disp: 96 each, Rfl: 2    latanoprost (XALATAN) 0.005 % ophthalmic solution, INSTILL 1 DROP INTO EACH EYE AT BEDTIME, Disp: , Rfl:     levETIRAcetam (KEPPRA) 500 MG tablet, Take 1 tablet (500 mg) by mouth 2 (two) times daily, Disp: , Rfl:     levothyroxine (SYNTHROID) 25 MCG tablet, Take 1 tablet (25 mcg) by mouth daily, Disp: , Rfl:     losartan (COZAAR) 50 MG tablet, Take 50 mg by mouth daily, Disp: , Rfl:     pantoprazole (PROTONIX) 40 MG tablet, Take 1 tablet (40 mg) by mouth daily, Disp: 90 tablet, Rfl: 1    SSD 1 % cream, APPLY TWICE A DAY TO ULCER ON LEG FOR 2 WEEKS. COVER WITH BANDAID AFTER, Disp: , Rfl:     tacrolimus (PROTOPIC) 0.1 % ointment, Apply topically, Disp: , Rfl:     triamcinolone (KENALOG) 0.1 % cream, Apply topically, Disp: , Rfl:     hydrOXYzine (ATARAX) 10 MG tablet, Take 1 tablet (10 mg) by mouth every 8 (eight) hours as needed for Itching (Patient not taking: Reported on 12/29/2021), Disp: 30  tablet, Rfl: 1    triamcinolone (KENALOG) 0.1 % ointment, as needed (Patient not taking: Reported on 12/29/2021), Disp: , Rfl:   No current facility-administered medications for this visit.    Facility-Administered Medications Ordered in Other Visits:     0.9% NaCl infusion, 25 mL/hr, Intravenous, PRN, Al-Mondhiry, Jafar, MD    albuterol (PROVENTIL) (2.5 MG/3ML) 0.083% nebulizer solution 2.5 mg, 2.5 mg, Nebulization, Once PRN, Al-Mondhiry, Jafar, MD    diphenhydrAMINE (BENADRYL) injection 25 mg, 25 mg, Intravenous, Once PRN, Al-Mondhiry, Jafar, MD    diphenhydrAMINE (BENADRYL) injection 50 mg, 50 mg, Intravenous, Once PRN, Al-Mondhiry, Jafar, MD    EPINEPHrine (ADRENALIN) 1 MG/ML adult anaphylaxis kit 0.3 mg, 0.3 mg, Intramuscular, Once PRN, Al-Mondhiry, Jafar, MD    famotidine (PEPCID) injection 20 mg, 20 mg, Intravenous, Once PRN, Al-Mondhiry, Jafar, MD    LORazepam (ATIVAN) injection 1  mg, 1 mg, Intravenous, Q4H PRN, Al-Mondhiry, Jafar, MD    LORazepam (ATIVAN) tablet 1 mg, 1 mg, Oral, Q4H PRN, Al-Mondhiry, Jafar, MD    methylPREDNISolone sodium succinate (Solu-MEDROL) injection 40 mg, 40 mg, Intravenous, Once PRN, Al-Mondhiry, Jafar, MD    sodium chloride 0.9 % bolus 500 mL, 500 mL, Intravenous, Once PRN, Al-Mondhiry, Jafar, MD    Allergies   Allergen Reactions    Penicillins      Past Medical History:   Diagnosis Date    Hypercholesteremia     Hypertension     Seizures     Stroke      Past Surgical History:   Procedure Laterality Date    HAND, CLOSED REDUCTION  01/29/2019    right wrist     Family History   Problem Relation Age of Onset    Breast cancer Sister      Social History     Socioeconomic History    Marital status: Widowed   Tobacco Use    Smoking status: Never    Smokeless tobacco: Never   Vaping Use    Vaping status: Never Used   Substance and Sexual Activity    Alcohol use: Not Currently    Drug use: Never    Sexual activity: Not Currently     PHYSICAL EXAM:  Vitals:    12/29/21 1008   BP: 157/68   Pulse: 84   Resp: 16   Temp: 97.7 F (36.5 C)   SpO2: 100%       ECOG PS: 1-2  General: NAD, seated in wheelchair, very slow in standing up or walking  Eyes: Anicteric, normal conjunctiva  ENT: No sores in mouth  CV: RRR  RESP: CTA B  GI: NT/ND, no HSM  Neuro: AAO x 3, slow/stuttering gait, R > L weakness  MS: no swelling in arms or legs  SKIN: thin hyperpigmented patches and plaques throughout b/l LE, R>L; some on R flank, R shoulder and R forearm  - Right lower extremity - right inferior groin/superior thigh with a 6cm subcutaneous mass;  posterior leg with a 3cm indurated plaque with nodular edge; thin hyperpigmented patches with a plaque right ankle, open ulcer at distal R posterior ankle  - Left lower extremity - lateral knee with a 3.3cm indurated plaque with nodular edge; thin hyperpigmented patches with a plaque left popliteal fossa  LYMPH: negative cervical suplarclavicular  axillary     12/29/21  No significant interval change in MF  No sign of infection in right posterior ankle open wound    12/14/21  IMAGING:  PET CT WHOLE BODY  FINDINGS:   Mediastinum: SUV max 2.4  Liver: SUV max 3.2     HEAD AND NECK: Normal FDG distribution within the nasopharynx, oropharynx,  larynx and small sized thyroid gland. Limited evaluation of the brain  parenchyma demonstrates no discrete focus of increased FDG uptake. No FDG  avid cervical or supraclavicular lymph nodes. Minimal leftward deviation of  the nasal septum.     THORAX: No hypermetabolic foci within the lung parenchyma. Mildly FDG avid  subcentimeter bilateral axillary lymph nodes, likely inflammatory/reactive.  No other FDG avid thoracic lymph nodes. Coronary artery calcifications.  Aortic valvular calcifications. Small hiatal hernia. Bibasilar bandlike  linear atelectasis or scarring.     ABDOMEN AND PELVIS:   *  FDG avid right external iliac node (image 239): 0.4 x 0.7 cm with SUV  max 4.7  *  FDG avid right inguinal nodes, for example on image 243: up to 1.5 x  0.7 cm with SUV max up to 3.4     Mildly FDG avid subcentimeter left inguinal lymph nodes, likely  reactive/inflammatory.     Normal FDG distribution within the liver, spleen, mild atrophic pancreas,  and adrenal glands. Cholelithiasis. Non-FDG avid exophytic right renal  cyst, possibly containing a septation, measuring 6.0 x 4.9 cm (image 190).  Colonic diverticula, especially within the sigmoid colon, without evidence  of diverticulitis. Multiple pelvic phleboliths.     Below the knee misregistration somewhat limits evaluation.  MUSCULOSKELETAL/EXTREMITIES:   No hypermetabolic foci within the bone marrow. Multilevel spinal  degenerative changes. No aggressive lytic or sclerotic lesion. Right total  hip arthroplasty, with associated surrounding increased uptake, likely  inflammatory. Moderate right and mild left leg subcutaneous edema with mild  increased uptake, possibly  inflammatory.     CUTANEOUS/SUBCUTANEOUS:   Tiny cutaneous foci of uptake in the lateral aspect of the right hemipelvis  (images 213-222), for example, on image 220: up to 0.2 cm in thickness with  SUV max up to 3.3     FDG avid subcutaneous nodules, with reference examples as follows:  *  Right inguinal/proximal right thigh subcutaneous soft tissue nodule  (image 268): 1.9 x 2.2 cm with SUV max 6.6  *  Proximal right thigh anteromedial subcutaneous soft tissue nodule  (image 279): 2.7 x 3.6 cm with SUV max 14.7     Multiple cutaneous foci of uptake in the right lower extremity, below the  knee, with reference examples as follows:  *  Curvilinear cutaneous uptake along the anterolateral mid right leg  (image 427): 0.5 cm in thickness with SUV max 5.1  *  Cutaneous uptake within the anteromedial aspect of the mid right leg  (image 431): 0.2 cm in thickness with SUV max 5.2  *  Cutaneous uptake along the medial and posteromedial right leg (images  449-477): up to 0.4 cm in thickness with SUV max up to 7.7     Cutaneous/subcutaneous foci of uptake within the left lower extremity, as  follows:  *  Cutaneous focus of uptake within the posterior distal left thigh  (image 349): 0.3 cm in thickness with SUV max 3.5  *  At least 3 foci of cutaneous uptake lateral to the left knee (image  [phone]), for example on image 372: up to 0.4 cm in thickness with SUV max  up to 9.3  *  Cutaneous/subcutaneous focus of uptake within the mid left leg (image  425): 0.5 x 0.3 cm with SUV max 4.7  IMPRESSION:   1. FDG avid cutaneous foci of uptake within the bilateral lower  extremities, particularly at and below the knees, is consistent with  malignancy.  2. FDG avid subcutaneous nodules within the right inguinal/right proximal  thigh region is consistent with malignancy.  3. FDG avid right inguinal and right external iliac lymph nodes are  suspicious for nodal metastatic disease.    PATHOLOGY    07/14/18            Procedure Date &  Time: 11/03/2021, 09:00                              FLOW CYTOMETRY REPORT          FLOW INTERPRETATION:          PERIPHERAL BLOOD: NO IMMUNOPHENOTYPIC ABNORMALITIES IDENTIFIED          COMMENT:     A population (12%) of lymphocytes is detected. About 53% of these cells     are CD3+ T cells and 30% are NK cells. Expressed as a percentage of     total T cells, CD4+/CD7-T cells are 4.0%, and CD4+/CD26-T cells are     3.9%. The calculated CD4/CD8 ratio is about 5.7. 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 Correlation with     morphology, clinical findings and other laboratory studies is     recommended for a complete evaluation.        Latest Reference Range & Units 12/29/21 09:55   WBC 3.10 - 9.50 x10 3/uL 5.82   Hemoglobin 11.4 - 14.8 g/dL 81.1   Hematocrit 91.4 - 43.7 % 36.1   Platelet Count 142 - 346 x10 3/uL 291   RBC 3.90 - 5.10 x10 6/uL 4.60   MCV 78.0 - 96.0 fL 78.5   MCH 25.1 - 33.5 pg 25.7   MCHC 31.5 - 35.8 g/dL 78.2   RDW 11 - 15 % 15   MPV 8.9 - 12.5 fL 9.7   Instrument Absolute Neutrophil Count 1.10 - 6.33 x10 3/uL 4.44   Neutrophils None % 76.3   Lymphocytes Automated None % 14.1   Lymphocytes Absolute Automated 0.42 - 3.22 x10 3/uL 0.82   Monocytes None % 6.7   Monocytes Absolute Automated 0.21 - 0.85 x10 3/uL 0.39   Eosinophils Automated None % 2.1   Eosinophils Absolute Automated 0.00 - 0.44 x10 3/uL 0.12   Basophils Automated None % 0.5   Basophils Absolute Automated 0.00 - 0.08 x10 3/uL 0.03   Immature Granulocytes None % 0.3   Immature Granulocytes Absolute 0.00 - 0.07 x10 3/uL 0.02   Nucleated RBC 0.0 - 0.0 /100 WBC 0.0   Neutrophils Absolute 1.10 - 6.33 x10 3/uL 4.44   Nucleated RBC Absolute 0.00 - 0.00 x10 3/uL 0.00   Glucose 70 - 100 mg/dL 956 (H)   Hemolysis Index 0 - 24 Index 13   BUN 7.0 - 21.0 mg/dL 21.3   Creatinine 0.4 - 1.0 mg/dL 1.0   Sodium 086 - 578 mEq/L 141   Potassium 3.5 - 5.3 mEq/L 4.0   Chloride 99 - 111 mEq/L 104   CO2 17 - 29 mEq/L 26    Calcium 7.9 - 10.2 mg/dL 46.9   Anion Gap 5.0 - 15.0  11.0   EGFR  >60.0   AST 5 - 41 U/L 27   ALT 0 - 55 U/L 15   Alkaline  Phosphatase 37 - 117 U/L 87   Albumin 3.5 - 5.0 g/dL 3.9   Protein Total 6.0 - 8.3 g/dL 8.4 (H)   Globulin 2.0 - 3.6 g/dL 4.5 (H)   Albumin/Globulin Ratio 0.9 - 2.2  0.9   Bilirubin Total 0.2 - 1.2 mg/dL 0.4   (H): Data is abnormally high    Assessment & Plan:   [name] Wall is 88 y.o. FST5 female h/o HTN, HLD, CVA with chronic R-sided weakness, and Mycosis Fungoides since 2019. She recently developed new nodules and lymphadenopathy with biopsy proven advancement to tumor stage disease, Stage 2B CTCL, MF subtype. Given scattered CD30+ disease, we recommend patient to receive brentuximab. We reviewed the dose (1.8mg /kg, with max 180mg ), schedule (every 3 weeks), route (IV), potential benefits and side effects (including but not limited to neutropenia, neuropathy). Given her advanced age and comorbidities, we will plan for a very limited series of treatments (1-3 doses).       Stage 2B CTCL, MF subtype  - PET CT shows diffuse skin lesions and dermatopathic vs. Early malignancy in skin draining inguinal nodes  - Flow, TCR, CBC without evidence of blood tumor burden  - Systemic Treatment: Brentuximab 1.8mg /kg IV q3wks, plan for 1-3 doses   Reviewed the potential side effects (focusing on neuropathy, neutropenia)    -- CBC, CMP today look good. Proceed with 1st infusion today. Dose adjusted to today's weight    - Continue concurrent skin-directed therapy (NBUVB) with primary derm Dr. Chandra Batch    2. Open wound  Looks clean today  Continue wound care      RTC on 4/25 for 2nd brentuximab with labs (CBC, CMP) prior to clinic visit    Tiney Rouge, MD   Medical Oncology  Director, Melanoma and Cutaneous Oncology Therapeutics and Research  [facility]  P [phone]

## 2021-12-30 ENCOUNTER — Ambulatory Visit: Payer: Medicare Other | Attending: Foot & Ankle Surgery

## 2021-12-30 DIAGNOSIS — E785 Hyperlipidemia, unspecified: Secondary | ICD-10-CM | POA: Insufficient documentation

## 2021-12-30 DIAGNOSIS — L97919 Non-pressure chronic ulcer of unspecified part of right lower leg with unspecified severity: Secondary | ICD-10-CM | POA: Insufficient documentation

## 2021-12-30 DIAGNOSIS — S81801D Unspecified open wound, right lower leg, subsequent encounter: Secondary | ICD-10-CM

## 2021-12-30 DIAGNOSIS — I10 Essential (primary) hypertension: Secondary | ICD-10-CM | POA: Insufficient documentation

## 2021-12-30 DIAGNOSIS — S81801A Unspecified open wound, right lower leg, initial encounter: Secondary | ICD-10-CM | POA: Insufficient documentation

## 2022-01-18 ENCOUNTER — Other Ambulatory Visit: Payer: Self-pay | Admitting: Student in an Organized Health Care Education/Training Program

## 2022-01-19 ENCOUNTER — Ambulatory Visit
Payer: Medicare Other | Attending: Student in an Organized Health Care Education/Training Program | Admitting: Student in an Organized Health Care Education/Training Program

## 2022-01-19 ENCOUNTER — Other Ambulatory Visit: Payer: Medicare Other

## 2022-01-19 ENCOUNTER — Ambulatory Visit: Payer: Medicare Other

## 2022-01-19 ENCOUNTER — Encounter: Payer: Self-pay | Admitting: Student in an Organized Health Care Education/Training Program

## 2022-01-19 VITALS — BP 135/63 | HR 79 | Temp 97.4°F | Wt 119.0 lb

## 2022-01-19 VITALS — BP 154/66 | HR 82 | Temp 97.4°F | Resp 14 | Ht 60.0 in | Wt 119.0 lb

## 2022-01-19 DIAGNOSIS — C8405 Mycosis fungoides, lymph nodes of inguinal region and lower limb: Secondary | ICD-10-CM | POA: Insufficient documentation

## 2022-01-19 DIAGNOSIS — Z5112 Encounter for antineoplastic immunotherapy: Secondary | ICD-10-CM | POA: Insufficient documentation

## 2022-01-19 DIAGNOSIS — Z79899 Other long term (current) drug therapy: Secondary | ICD-10-CM | POA: Insufficient documentation

## 2022-01-19 LAB — CBC AND DIFFERENTIAL
Absolute NRBC: 0 10*3/uL (ref 0.00–0.00)
Basophils Absolute Automated: 0.04 10*3/uL (ref 0.00–0.08)
Basophils Automated: 0.7 %
Eosinophils Absolute Automated: 0.06 10*3/uL (ref 0.00–0.44)
Eosinophils Automated: 1 %
Hematocrit: 35.5 % (ref 34.7–43.7)
Hgb: 11.3 g/dL — ABNORMAL LOW (ref 11.4–14.8)
Immature Granulocytes Absolute: 0.05 10*3/uL (ref 0.00–0.07)
Immature Granulocytes: 0.8 %
Instrument Absolute Neutrophil Count: 4.09 10*3/uL (ref 1.10–6.33)
Lymphocytes Absolute Automated: 0.99 10*3/uL (ref 0.42–3.22)
Lymphocytes Automated: 16.8 %
MCH: 25.3 pg (ref 25.1–33.5)
MCHC: 31.8 g/dL (ref 31.5–35.8)
MCV: 79.4 fL (ref 78.0–96.0)
MPV: 9.2 fL (ref 8.9–12.5)
Monocytes Absolute Automated: 0.68 10*3/uL (ref 0.21–0.85)
Monocytes: 11.5 %
Neutrophils Absolute: 4.09 10*3/uL (ref 1.10–6.33)
Neutrophils: 69.2 %
Nucleated RBC: 0 /100 WBC (ref 0.0–0.0)
Platelets: 382 10*3/uL — ABNORMAL HIGH (ref 142–346)
RBC: 4.47 10*6/uL (ref 3.90–5.10)
RDW: 16 % — ABNORMAL HIGH (ref 11–15)
WBC: 5.91 10*3/uL (ref 3.10–9.50)

## 2022-01-19 LAB — COMPREHENSIVE METABOLIC PANEL
ALT: 18 U/L (ref 0–55)
AST (SGOT): 30 U/L (ref 5–41)
Albumin/Globulin Ratio: 0.8 — ABNORMAL LOW (ref 0.9–2.2)
Albumin: 3.5 g/dL (ref 3.5–5.0)
Alkaline Phosphatase: 88 U/L (ref 37–117)
Anion Gap: 10 (ref 5.0–15.0)
BUN: 8 mg/dL (ref 7.0–21.0)
Bilirubin, Total: 0.4 mg/dL (ref 0.2–1.2)
CO2: 28 mEq/L (ref 17–29)
Calcium: 9.7 mg/dL (ref 7.9–10.2)
Chloride: 101 mEq/L (ref 99–111)
Creatinine: 0.9 mg/dL (ref 0.4–1.0)
Globulin: 4.6 g/dL — ABNORMAL HIGH (ref 2.0–3.6)
Glucose: 132 mg/dL — ABNORMAL HIGH (ref 70–100)
Potassium: 4 mEq/L (ref 3.5–5.3)
Protein, Total: 8.1 g/dL (ref 6.0–8.3)
Sodium: 139 mEq/L (ref 135–145)

## 2022-01-19 LAB — HEMOLYSIS INDEX: Hemolysis Index: 2 Index (ref 0–24)

## 2022-01-19 LAB — GFR: EGFR: >60

## 2022-01-19 MED ORDER — SODIUM CHLORIDE 0.9 % IV SOLN
1.8000 mg/kg | Freq: Once | INTRAVENOUS | Status: AC
Start: 2022-01-19 — End: 2022-01-19
  Administered 2022-01-19: 97 mg via INTRAVENOUS
  Filled 2022-01-19: qty 19.4

## 2022-01-19 NOTE — Progress Notes (Signed)
[facility]   Visit Date: 01/19/2022   1040        [name] CairoMildred Wall is a 88 y.o. female patient of [name] Al-Mondhiry, MD    Since her last visit, she has been doing fair.   Patient presents to the infusion center for Cycle 2.   Overall, she states her energy level is good.  The patient is able to perform most usual functions.. Her appetite is reported to be intact, with no significant changes in weight.. She reports she was doing fairly well after the first infusion.  + fatigue, loss of appetite noted and the symptoms lasted around 1 week.  Her labs are within parameter.  Pt just saw Dr Wall for follow up downstairs and everything is good per pt. She has no other concerns. There are no social work or other referral needs at this time.    Diagnosis:  Mycosis fungoides of lymph nodes of inguinal region or lower extremity  Treatment on Clinical Trial: Not On Study (NOS)  [facility] - OP ADULT - Lymphoma - Brentuximab      IV Pre Hydration: N/A  Urine Parameters:n/a  Pre-Medications: n/a    Line Type: Peripheral IV:  Right Hand  Blood Return verified prior to administration: Yes      Cryotherapy: No    Vital Signs:  Patient Vitals for the past 12 hrs:   BP Temp Pulse   01/19/22 1212 135/63 -- 79   01/19/22 1104 137/57 97.4 F (36.3 C) 83      Body surface area is 1.51 meters squared.    '  Chemistry        Component Value Date/Time    NA 139 01/19/2022 0919    K 4.0 01/19/2022 0919    CL 101 01/19/2022 0919    CO2 28 01/19/2022 0919    BUN 8.0 01/19/2022 0919    CREAT 0.9 01/19/2022 0919    GLU 132 (H) 01/19/2022 0919        Component Value Date/Time    CA 9.7 01/19/2022 0919    ALKPHOS 88 01/19/2022 0919    AST 30 01/19/2022 0919    ALT 18 01/19/2022 0919    BILITOTAL 0.4 01/19/2022 0919            Lab Results   Component Value Date    WBC 5.91 01/19/2022    HGB 11.3 (L) 01/19/2022    PLT 382 (H) 01/19/2022    NEUTROABS 4.09 01/19/2022       Administrations This Visit        brentuximab vedotin (ADCETRIS) 97 mg in sodium chloride 0.9 % 134.4 mL chemo infusion       Admin Date  01/19/2022  11:32 Action  New Bag Dose  97 mg Rate  268.8 mL/hr Route  Intravenous Ordering Provider  Wall, Jafar, MD                    Chemo Given within the past 12 hours:   "Chemo/Biotherapy Given" (last 12 hours)       Date/Time Action User Medication Dose Rate Dose Volume (mL) Rate    01/19/22 1215 Stopped    AN brentuximab vedotin (ADCETRIS) 97 mg in sodium chloride 0.9 % 134.4 mL chemo infusion  0 mL/hr *0 mg         01/19/22 1132 New Bag    AN brentuximab vedotin (ADCETRIS) 97 mg in sodium chloride 0.9 % 134.4 mL  chemo infusion 97 mg 268.8 mL/hr *97 mg                   Hypersensitivity Reaction Noted: No  IV Post Hydration: No, not required  Treatment Outcome:Patient tolerated treatment well.  Blood return verified after administration: Yes    Education: Patient/Family educated regarding the expected outcomes/side effects possible interactions of treatments. Patient verbalizes understanding.    Follow-up Plan: A return to the Infusion Center for the next treatment, has been scheduled.  RTC in 3 weeks for more treatment.      [name] Stacks, RN    01/19/2022  1230, pt discharged stable via wheelchair.

## 2022-01-19 NOTE — Progress Notes (Signed)
[facility]       Date Time: January 19, 2022   Patient Name: [name] Wall,[name] Wall   Referring Provider: Layne Benton, MD    Primary Dx: Mycosis Fungoides, stage 2B      ONCOLOGY CARE TEAM  Dermatology: Russella Dar, MD  Derm Oncology: Ferne Reus, MD  Radiation Oncology:  Quentin Mulling, MD      HISTORY OF PRESENT ILLNESS  [name] Wall is 88 y.o. female h/o HTN, HLD, CVA with chronic R-sided weakness, and Mycosis Fungoides since 2019. She was previously treated with NBUVB.  She established care here at [facility] in November 2022 when she relocated to [facility] for MF. She developed tumor stage MF in Jan 2023 despite NVUBV, therefore, consensus recommendation was to treat with brentuximab.     She's here to continue brentuximab, in the company of her daughter. She has experienced increased fatigue and anorexia since last visit. Still eating, but much decreased. Weight largely stable.    She continues to tolerate NVUBV two times a week, 6.56min per session. She has a chronic open skin lesion in right posterior ankle without bleeding or drainage, improving. Continues wound care as outpatient.     She has chronic left side weakness from CVA in 2022, and R hand/wrist weakness s/p broken wrist. Denies any peripheral neuropathy such as numbness or tingling.     No history of other skin cancers. Negative family history of skin cancer and lymphoma.     MEDICATIONS:    Current Outpatient Medications:     ALPRAZolam (XANAX) 0.25 MG tablet, Take 1 tablet (0.25 mg) by mouth daily as needed for Anxiety, Disp: 30 tablet, Rfl: 0    amLODIPine (NORVASC) 10 MG tablet, Take 1 tablet (10 mg) by mouth daily, Disp: , Rfl:     aspirin 81 MG EC tablet, Take 1 tablet (81 mg) by mouth, Disp: , Rfl:     atorvastatin (LIPITOR) 40 MG tablet, Take 1 tablet (40 mg) by mouth every 24 hours, Disp: , Rfl:     ferrous gluconate (FERGON) 324 MG tablet, Take 1 tablet (324 mg) by mouth every morning with breakfast,  Disp: 90 tablet, Rfl: 3    gentamicin (GARAMYCIN) 0.1 % ointment, APPLY UP TO TWICE A DAY ON CRUSTED AREAS UNTIL RESOLVED, Disp: , Rfl:     hydrALAZINE (APRESOLINE) 50 MG tablet, Take 1 tablet (50 mg) by mouth 3 (three) times daily with meals, Disp: , Rfl:     hydrOXYzine (ATARAX) 10 MG tablet, Take 1 tablet (10 mg) by mouth every 8 (eight) hours as needed for Itching (Patient not taking: Reported on 12/30/2021), Disp: 30 tablet, Rfl: 1    imiquimod (ALDARA) 5 % cream, Apply nightly three times a week (Monday, Wednesday, Friday) to behind left knee and right ankle, Disp: 96 each, Rfl: 2    latanoprost (XALATAN) 0.005 % ophthalmic solution, INSTILL 1 DROP INTO EACH EYE AT BEDTIME, Disp: , Rfl:     levETIRAcetam (KEPPRA) 500 MG tablet, Take 1 tablet (500 mg) by mouth 2 (two) times daily, Disp: , Rfl:     levothyroxine (SYNTHROID) 25 MCG tablet, Take 1 tablet (25 mcg) by mouth daily, Disp: , Rfl:     losartan (COZAAR) 50 MG tablet, Take 50 mg by mouth daily, Disp: , Rfl:     pantoprazole (PROTONIX) 40 MG tablet, Take 1 tablet (40 mg) by mouth daily, Disp: 90 tablet, Rfl: 1    SSD 1 % cream, APPLY TWICE A  DAY TO ULCER ON LEG FOR 2 WEEKS. COVER WITH BANDAID AFTER, Disp: , Rfl:     tacrolimus (PROTOPIC) 0.1 % ointment, Apply topically, Disp: , Rfl:     triamcinolone (KENALOG) 0.1 % cream, Apply topically, Disp: , Rfl:     triamcinolone (KENALOG) 0.1 % ointment, as needed, Disp: , Rfl:     Allergies   Allergen Reactions    Penicillins      Past Medical History:   Diagnosis Date    Hypercholesteremia     Hypertension     Seizures     Stroke      Past Surgical History:   Procedure Laterality Date    HAND, CLOSED REDUCTION  01/29/2019    right wrist     Family History   Problem Relation Age of Onset    Breast cancer Sister      Social History     Socioeconomic History    Marital status: Widowed   Tobacco Use    Smoking status: Never    Smokeless tobacco: Never   Vaping Use    Vaping status: Never Used   Substance and Sexual  Activity    Alcohol use: Not Currently    Drug use: Never    Sexual activity: Not Currently     PHYSICAL EXAM:  Vitals:    01/19/22 0949   BP: 154/66   Pulse: 82   Resp: 14   Temp: 97.4 F (36.3 C)   SpO2: 98%     ECOG PS: 1-2  General: NAD, seated in wheelchair, very slow in standing up or walking  Eyes: Anicteric, normal conjunctiva  ENT: No sores in mouth  CV: RRR  RESP: CTA B  GI: NT/ND, no HSM  Neuro: AAO x 3, slow/stuttering gait, R > L weakness  MS: no swelling in arms or legs  SKIN: thin hyperpigmented patches and plaques throughout b/l LE, R>L; some on R flank, R shoulder and R forearm  - Right lower extremity - right inferior groin/superior thigh with a 6cm subcutaneous mass;  posterior leg with a 3cm indurated plaque with nodular edge; thin hyperpigmented patches with a plaque right ankle, (overall stable); open ulcer at distal R posterior ankle (improving)  - Left lower extremity - lateral knee with a 3.3cm indurated plaque with nodular edge; thin hyperpigmented patches with a plaque left popliteal fossa  LYMPH: negative cervical suplarclavicular axillary     01/19/22            12/29/21  No significant interval change in MF  No sign of infection in right posterior ankle open wound    12/14/21              LABS:  Lab Results   Component Value Date    WBC 5.91 01/19/2022    HGB 11.3 (L) 01/19/2022    HCT 35.5 01/19/2022    MCV 79.4 01/19/2022    PLT 382 (H) 01/19/2022     Lab Results   Component Value Date    CREAT 0.9 01/19/2022    BUN 8.0 01/19/2022    NA 139 01/19/2022    K 4.0 01/19/2022    CL 101 01/19/2022    CO2 28 01/19/2022     Lab Results   Component Value Date    ALT 18 01/19/2022    AST 30 01/19/2022    ALKPHOS 88 01/19/2022    BILITOTAL 0.4 01/19/2022         IMAGING:  PET CT WHOLE  BODY  FINDINGS:   Mediastinum: SUV max 2.4  Liver: SUV max 3.2     HEAD AND NECK: Normal FDG distribution within the nasopharynx, oropharynx,  larynx and small sized thyroid gland. Limited evaluation of the  brain  parenchyma demonstrates no discrete focus of increased FDG uptake. No FDG  avid cervical or supraclavicular lymph nodes. Minimal leftward deviation of  the nasal septum.     THORAX: No hypermetabolic foci within the lung parenchyma. Mildly FDG avid  subcentimeter bilateral axillary lymph nodes, likely inflammatory/reactive.  No other FDG avid thoracic lymph nodes. Coronary artery calcifications.  Aortic valvular calcifications. Small hiatal hernia. Bibasilar bandlike  linear atelectasis or scarring.     ABDOMEN AND PELVIS:   *  FDG avid right external iliac node (image 239): 0.4 x 0.7 cm with SUV  max 4.7  *  FDG avid right inguinal nodes, for example on image 243: up to 1.5 x  0.7 cm with SUV max up to 3.4     Mildly FDG avid subcentimeter left inguinal lymph nodes, likely  reactive/inflammatory.     Normal FDG distribution within the liver, spleen, mild atrophic pancreas,  and adrenal glands. Cholelithiasis. Non-FDG avid exophytic right renal  cyst, possibly containing a septation, measuring 6.0 x 4.9 cm (image 190).  Colonic diverticula, especially within the sigmoid colon, without evidence  of diverticulitis. Multiple pelvic phleboliths.     Below the knee misregistration somewhat limits evaluation.  MUSCULOSKELETAL/EXTREMITIES:   No hypermetabolic foci within the bone marrow. Multilevel spinal  degenerative changes. No aggressive lytic or sclerotic lesion. Right total  hip arthroplasty, with associated surrounding increased uptake, likely  inflammatory. Moderate right and mild left leg subcutaneous edema with mild  increased uptake, possibly inflammatory.     CUTANEOUS/SUBCUTANEOUS:   Tiny cutaneous foci of uptake in the lateral aspect of the right hemipelvis  (images 213-222), for example, on image 220: up to 0.2 cm in thickness with  SUV max up to 3.3     FDG avid subcutaneous nodules, with reference examples as follows:  *  Right inguinal/proximal right thigh subcutaneous soft tissue nodule  (image  268): 1.9 x 2.2 cm with SUV max 6.6  *  Proximal right thigh anteromedial subcutaneous soft tissue nodule  (image 279): 2.7 x 3.6 cm with SUV max 14.7     Multiple cutaneous foci of uptake in the right lower extremity, below the  knee, with reference examples as follows:  *  Curvilinear cutaneous uptake along the anterolateral mid right leg  (image 427): 0.5 cm in thickness with SUV max 5.1  *  Cutaneous uptake within the anteromedial aspect of the mid right leg  (image 431): 0.2 cm in thickness with SUV max 5.2  *  Cutaneous uptake along the medial and posteromedial right leg (images  449-477): up to 0.4 cm in thickness with SUV max up to 7.7     Cutaneous/subcutaneous foci of uptake within the left lower extremity, as  follows:  *  Cutaneous focus of uptake within the posterior distal left thigh  (image 349): 0.3 cm in thickness with SUV max 3.5  *  At least 3 foci of cutaneous uptake lateral to the left knee (image  [phone]), for example on image 372: up to 0.4 cm in thickness with SUV max  up to 9.3  *  Cutaneous/subcutaneous focus of uptake within the mid left leg (image  425): 0.5 x 0.3 cm with SUV max 4.7     IMPRESSION:  1. FDG avid cutaneous foci of uptake within the bilateral lower  extremities, particularly at and below the knees, is consistent with  malignancy.  2. FDG avid subcutaneous nodules within the right inguinal/right proximal  thigh region is consistent with malignancy.  3. FDG avid right inguinal and right external iliac lymph nodes are  suspicious for nodal metastatic disease.    PATHOLOGY    07/14/18            Procedure Date & Time: 11/03/2021, 09:00                              FLOW CYTOMETRY REPORT          FLOW INTERPRETATION:          PERIPHERAL BLOOD: NO IMMUNOPHENOTYPIC ABNORMALITIES IDENTIFIED          COMMENT:     A population (12%) of lymphocytes is detected. About 53% of these cells     are CD3+ T cells and 30% are NK cells. Expressed as a percentage of     total T cells,  CD4+/CD7-T cells are 4.0%, and CD4+/CD26-T cells are     3.9%. The calculated CD4/CD8 ratio is about 5.7. 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 Correlation with     morphology, clinical findings and other laboratory studies is     recommended for a complete evaluation.       Assessment & Plan:   Jayliah Benett is 88 y.o. FST5 female h/o HTN, HLD, CVA with chronic R-sided weakness, and Mycosis Fungoides since 2019. She recently developed new nodules and lymphadenopathy with biopsy proven advancement to tumor stage disease, Stage 2B CTCL, MF subtype. Given scattered CD30+ disease, we recommend patient to receive brentuximab. We reviewed the dose (1.8mg /kg, with max 180mg ), schedule (every 3 weeks), route (IV), potential benefits and side effects (including but not limited to neutropenia, neuropathy). Given her advanced age and comorbidities, we will plan for a very limited series of treatments (1-3 doses).     Stage 2B CTCL, MF subtype  - PET CT shows diffuse skin lesions and dermatopathic vs. Early malignancy in skin draining inguinal nodes  - Flow, TCR, CBC without evidence of blood tumor burden  - Systemic Treatment: Brentuximab 1.8mg /kg IV q3wks, plan for 1-3 doses   Reviewed the potential side effects (focusing on neuropathy, neutropenia)    -- CBC, CMP today look good. Proceed with 2nd infusion today.   - Continue concurrent skin-directed therapy (NBUVB) with primary derm Dr. Chandra Batch  - Dual visit with Dr. Bryson Ha for TBSE in 6wks    2. Open wound  Looks clean today  Continue wound care  Dressed in clinic today    RTC on 4/25 for 3rd brentuximab with labs (CBC, CMP) prior to clinic visit      Terri Rorrer Al-Mondhiry, MD, MA  Medical Oncologist, Cutaneous Malignancies Program  Assistant Professor of Medical Education, [employer]  [facility]  [id number]  [place], [place] [address]  Phone: [phone]  Fax: [phone]

## 2022-01-20 ENCOUNTER — Ambulatory Visit: Payer: Medicare Other | Attending: Foot & Ankle Surgery

## 2022-01-20 DIAGNOSIS — L97919 Non-pressure chronic ulcer of unspecified part of right lower leg with unspecified severity: Secondary | ICD-10-CM | POA: Insufficient documentation

## 2022-01-20 DIAGNOSIS — I10 Essential (primary) hypertension: Secondary | ICD-10-CM | POA: Insufficient documentation

## 2022-01-20 DIAGNOSIS — S81801A Unspecified open wound, right lower leg, initial encounter: Secondary | ICD-10-CM | POA: Insufficient documentation

## 2022-01-20 DIAGNOSIS — Z85828 Personal history of other malignant neoplasm of skin: Secondary | ICD-10-CM | POA: Insufficient documentation

## 2022-01-20 DIAGNOSIS — E785 Hyperlipidemia, unspecified: Secondary | ICD-10-CM | POA: Insufficient documentation

## 2022-01-20 DIAGNOSIS — Z8673 Personal history of transient ischemic attack (TIA), and cerebral infarction without residual deficits: Secondary | ICD-10-CM | POA: Insufficient documentation

## 2022-01-20 DIAGNOSIS — I639 Cerebral infarction, unspecified: Secondary | ICD-10-CM | POA: Insufficient documentation

## 2022-02-03 ENCOUNTER — Ambulatory Visit: Payer: Medicare Other | Attending: Foot & Ankle Surgery

## 2022-02-03 DIAGNOSIS — I10 Essential (primary) hypertension: Secondary | ICD-10-CM | POA: Insufficient documentation

## 2022-02-03 DIAGNOSIS — L97912 Non-pressure chronic ulcer of unspecified part of right lower leg with fat layer exposed: Secondary | ICD-10-CM | POA: Insufficient documentation

## 2022-02-03 DIAGNOSIS — Z8673 Personal history of transient ischemic attack (TIA), and cerebral infarction without residual deficits: Secondary | ICD-10-CM | POA: Insufficient documentation

## 2022-02-03 DIAGNOSIS — E785 Hyperlipidemia, unspecified: Secondary | ICD-10-CM | POA: Insufficient documentation

## 2022-02-09 ENCOUNTER — Ambulatory Visit
Payer: Medicare Other | Attending: Student in an Organized Health Care Education/Training Program | Admitting: Student in an Organized Health Care Education/Training Program

## 2022-02-09 ENCOUNTER — Ambulatory Visit: Payer: Medicare Other

## 2022-02-09 ENCOUNTER — Encounter: Payer: Self-pay | Admitting: Student in an Organized Health Care Education/Training Program

## 2022-02-09 ENCOUNTER — Other Ambulatory Visit: Payer: Medicare Other

## 2022-02-09 VITALS — BP 150/70 | HR 82 | Temp 97.2°F | Resp 14 | Ht 60.0 in | Wt 115.0 lb

## 2022-02-09 VITALS — BP 152/66 | HR 75 | Temp 97.4°F | Wt 114.0 lb

## 2022-02-09 DIAGNOSIS — X58XXXD Exposure to other specified factors, subsequent encounter: Secondary | ICD-10-CM | POA: Insufficient documentation

## 2022-02-09 DIAGNOSIS — C8405 Mycosis fungoides, lymph nodes of inguinal region and lower limb: Secondary | ICD-10-CM | POA: Insufficient documentation

## 2022-02-09 DIAGNOSIS — R634 Abnormal weight loss: Secondary | ICD-10-CM | POA: Insufficient documentation

## 2022-02-09 DIAGNOSIS — K5900 Constipation, unspecified: Secondary | ICD-10-CM | POA: Insufficient documentation

## 2022-02-09 DIAGNOSIS — I69351 Hemiplegia and hemiparesis following cerebral infarction affecting right dominant side: Secondary | ICD-10-CM | POA: Insufficient documentation

## 2022-02-09 DIAGNOSIS — E785 Hyperlipidemia, unspecified: Secondary | ICD-10-CM | POA: Insufficient documentation

## 2022-02-09 DIAGNOSIS — S91001D Unspecified open wound, right ankle, subsequent encounter: Secondary | ICD-10-CM | POA: Insufficient documentation

## 2022-02-09 DIAGNOSIS — R630 Anorexia: Secondary | ICD-10-CM | POA: Insufficient documentation

## 2022-02-09 DIAGNOSIS — Z5111 Encounter for antineoplastic chemotherapy: Secondary | ICD-10-CM | POA: Insufficient documentation

## 2022-02-09 DIAGNOSIS — I10 Essential (primary) hypertension: Secondary | ICD-10-CM | POA: Insufficient documentation

## 2022-02-09 LAB — COMPREHENSIVE METABOLIC PANEL
ALT: 18 U/L (ref 0–55)
AST (SGOT): 34 U/L (ref 5–41)
Albumin/Globulin Ratio: 0.8 — ABNORMAL LOW (ref 0.9–2.2)
Albumin: 3.7 g/dL (ref 3.5–5.0)
Alkaline Phosphatase: 85 U/L (ref 37–117)
Anion Gap: 9 (ref 5.0–15.0)
BUN: 9 mg/dL (ref 7.0–21.0)
Bilirubin, Total: 0.6 mg/dL (ref 0.2–1.2)
CO2: 28 mEq/L (ref 17–29)
Calcium: 9.7 mg/dL (ref 7.9–10.2)
Chloride: 100 mEq/L (ref 99–111)
Creatinine: 0.8 mg/dL (ref 0.4–1.0)
Globulin: 4.8 g/dL — ABNORMAL HIGH (ref 2.0–3.6)
Glucose: 116 mg/dL — ABNORMAL HIGH (ref 70–100)
Potassium: 3.1 mEq/L — ABNORMAL LOW (ref 3.5–5.3)
Protein, Total: 8.5 g/dL — ABNORMAL HIGH (ref 6.0–8.3)
Sodium: 137 mEq/L (ref 135–145)
eGFR: >60 mL/min/{1.73_m2} (ref >=60)

## 2022-02-09 LAB — CBC AND DIFFERENTIAL
Absolute NRBC: 0 10*3/uL (ref 0.00–0.00)
Basophils Absolute Automated: 0.04 10*3/uL (ref 0.00–0.08)
Basophils Automated: 0.9 %
Eosinophils Absolute Automated: 0.01 10*3/uL (ref 0.00–0.44)
Eosinophils Automated: 0.2 %
Hematocrit: 35.1 % (ref 34.7–43.7)
Hgb: 11.5 g/dL (ref 11.4–14.8)
Immature Granulocytes Absolute: 0.02 10*3/uL (ref 0.00–0.07)
Immature Granulocytes: 0.4 %
Instrument Absolute Neutrophil Count: 2.86 10*3/uL (ref 1.10–6.33)
Lymphocytes Absolute Automated: 0.92 10*3/uL (ref 0.42–3.22)
Lymphocytes Automated: 20.6 %
MCH: 25.7 pg (ref 25.1–33.5)
MCHC: 32.8 g/dL (ref 31.5–35.8)
MCV: 78.5 fL (ref 78.0–96.0)
MPV: 9.8 fL (ref 8.9–12.5)
Monocytes Absolute Automated: 0.62 10*3/uL (ref 0.21–0.85)
Monocytes: 13.9 %
Neutrophils Absolute: 2.86 10*3/uL (ref 1.10–6.33)
Neutrophils: 64 %
Nucleated RBC: 0 /100 WBC (ref 0.0–0.0)
Platelets: 358 10*3/uL — ABNORMAL HIGH (ref 142–346)
RBC: 4.47 10*6/uL (ref 3.90–5.10)
RDW: 17 % — ABNORMAL HIGH (ref 11–15)
WBC: 4.47 10*3/uL (ref 3.10–9.50)

## 2022-02-09 LAB — HEMOLYSIS INDEX: Hemolysis Index: 3 Index (ref 0–24)

## 2022-02-09 MED ORDER — SODIUM CHLORIDE 0.9 % IV SOLN
1.8000 mg/kg | Freq: Once | INTRAVENOUS | Status: AC
Start: 2022-02-09 — End: 2022-02-09
  Administered 2022-02-09: 97 mg via INTRAVENOUS
  Filled 2022-02-09: qty 19.4

## 2022-02-09 MED ORDER — POTASSIUM CHLORIDE CRYS ER 10 MEQ PO TBCR
20.0000 meq | EXTENDED_RELEASE_TABLET | Freq: Two times a day (BID) | ORAL | 0 refills | Status: AC
Start: 2022-02-09 — End: 2022-02-16

## 2022-02-09 MED ORDER — OLANZAPINE 2.5 MG PO TABS
2.5000 mg | ORAL_TABLET | Freq: Every evening | ORAL | 0 refills | Status: DC
Start: 2022-02-09 — End: 2022-03-11

## 2022-02-09 NOTE — Progress Notes (Signed)
[facility]       Date Time: Feb 09, 2022   Patient Name: Wall,[name]   Referring Provider: Algis Greenhouse, MD    Primary Dx: Mycosis Fungoides, stage 2B      ONCOLOGY CARE TEAM  Dermatology: Russella Dar, MD  Derm Oncology: Ferne Reus, MD  Radiation Oncology:  Quentin Mulling, MD      HISTORY OF PRESENT ILLNESS  [name] Wall is 88 y.o. female h/o HTN, HLD, CVA with chronic R-sided weakness, and Mycosis Fungoides since 2019. She was previously treated with NBUVB.  She established care here at [facility] in November 2022 when she relocated to [facility] for MF. She developed tumor stage MF in Jan 2023 despite NVUBV, therefore, consensus recommendation was to treat with brentuximab.     She's here to continue brentuximab, today is the 3rd and final infusion for now, in the company of her daughter. She has experienced increased fatigue and anorexia since last visit, lost 4lbs over the last 3wks. Still eating, but much decreased. Constipated w/no bowel movement x5 days, fearful of taking laxatives due to bowel incontinence.    She continues to tolerate NVUBV two times a week, 6.51min per session. She has a chronic open skin lesion in right posterior ankle without bleeding or drainage, improving. Continues wound care as outpatient.     She has chronic left side weakness from CVA in 2022, and R hand/wrist weakness s/p broken wrist. Denies any peripheral neuropathy such as numbness or tingling.     No history of other skin cancers. Negative family history of skin cancer and lymphoma.     MEDICATIONS:    Current Outpatient Medications:     ALPRAZolam (XANAX) 0.25 MG tablet, Take 1 tablet (0.25 mg) by mouth daily as needed for Anxiety, Disp: 30 tablet, Rfl: 0    amLODIPine (NORVASC) 10 MG tablet, Take 1 tablet (10 mg) by mouth daily, Disp: , Rfl:     aspirin 81 MG EC tablet, Take 1 tablet (81 mg) by mouth, Disp: , Rfl:     atorvastatin (LIPITOR) 40 MG tablet, Take 1 tablet (40 mg) by  mouth every 24 hours, Disp: , Rfl:     ferrous gluconate (FERGON) 324 MG tablet, Take 1 tablet (324 mg) by mouth every morning with breakfast, Disp: 90 tablet, Rfl: 3    gentamicin (GARAMYCIN) 0.1 % ointment, APPLY UP TO TWICE A DAY ON CRUSTED AREAS UNTIL RESOLVED, Disp: , Rfl:     hydrALAZINE (APRESOLINE) 50 MG tablet, Take 1 tablet (50 mg) by mouth 3 (three) times daily with meals, Disp: , Rfl:     latanoprost (XALATAN) 0.005 % ophthalmic solution, INSTILL 1 DROP INTO EACH EYE AT BEDTIME, Disp: , Rfl:     levETIRAcetam (KEPPRA) 500 MG tablet, Take 1 tablet (500 mg) by mouth 2 (two) times daily, Disp: , Rfl:     levothyroxine (SYNTHROID) 25 MCG tablet, Take 1 tablet (25 mcg) by mouth daily, Disp: , Rfl:     losartan (COZAAR) 50 MG tablet, Take 1 tablet (50 mg) by mouth daily, Disp: , Rfl:     pantoprazole (PROTONIX) 40 MG tablet, Take 1 tablet (40 mg) by mouth daily, Disp: 90 tablet, Rfl: 1    SSD 1 % cream, , Disp: , Rfl:     tacrolimus (PROTOPIC) 0.1 % ointment, Apply topically, Disp: , Rfl:     triamcinolone (KENALOG) 0.1 % cream, Apply topically, Disp: , Rfl:     triamcinolone (KENALOG) 0.1 %  ointment, as needed, Disp: , Rfl:     hydrOXYzine (ATARAX) 10 MG tablet, Take 1 tablet (10 mg) by mouth every 8 (eight) hours as needed for Itching (Patient not taking: Reported on 02/09/2022), Disp: 30 tablet, Rfl: 1    imiquimod (ALDARA) 5 % cream, Apply nightly three times a week (Monday, Wednesday, Friday) to behind left knee and right ankle (Patient not taking: Reported on 01/19/2022), Disp: 96 each, Rfl: 2    Allergies   Allergen Reactions    Penicillins      Past Medical History:   Diagnosis Date    Hypercholesteremia     Hypertension     Seizures     Stroke      Past Surgical History:   Procedure Laterality Date    HAND, CLOSED REDUCTION  01/29/2019    right wrist     Family History   Problem Relation Age of Onset    Breast cancer Sister      Social History     Socioeconomic History    Marital status: Widowed    Tobacco Use    Smoking status: Never    Smokeless tobacco: Never   Vaping Use    Vaping status: Never Used   Substance and Sexual Activity    Alcohol use: Not Currently    Drug use: Never    Sexual activity: Not Currently     PHYSICAL EXAM:  Vitals:    02/09/22 1023   BP: 150/70   Pulse: 82   Resp: 14   Temp: 97.2 F (36.2 C)   SpO2: 100%     Wt Readings from Last 3 Encounters:   02/09/22 52.2 kg (115 lb)   01/19/22 54 kg (119 lb)   01/19/22 54 kg (119 lb)       ECOG PS: 1-2  General: NAD, seated in wheelchair, very slow in standing up or walking  Eyes: Anicteric, normal conjunctiva  ENT: No sores in mouth  CV: RRR  RESP: CTA B  GI: NT/ND, no HSM  Neuro: AAO x 3, slow/stuttering gait, R > L weakness  MS: no swelling in arms or legs  SKIN: thin hyperpigmented patches and plaques throughout b/l LE, R>L; some on R flank, R shoulder and R forearm  - Right lower extremity - right inferior groin/superior thigh with a 6cm subcutaneous mass;  posterior leg with a 3cm indurated plaque with nodular edge; thin hyperpigmented patches with a plaque right ankle, now with more raised plaque/tumor at R distal lower extremity, other lesions stable; open ulcer at distal R posterior ankle (improving)  - Left lower extremity - lateral knee with a 3.3cm indurated plaque with nodular edge; thin hyperpigmented patches with a plaque left popliteal fossa (improving)  LYMPH: negative cervical suplarclavicular axillary     02/09/22        01/19/22            12/29/21  No significant interval change in MF  No sign of infection in right posterior ankle open wound    12/14/21              LABS:  Lab Results   Component Value Date    WBC 4.47 02/09/2022    HGB 11.5 02/09/2022    HCT 35.1 02/09/2022    MCV 78.5 02/09/2022    PLT 358 (H) 02/09/2022     Lab Results   Component Value Date    CREAT 0.8 02/09/2022    BUN 9.0 02/09/2022  NA 137 02/09/2022    K 3.1 (L) 02/09/2022    CL 100 02/09/2022    CO2 28 02/09/2022     Lab Results   Component Value  Date    ALT 18 02/09/2022    AST 34 02/09/2022    ALKPHOS 85 02/09/2022    BILITOTAL 0.6 02/09/2022         IMAGING:  PET CT WHOLE BODY  FINDINGS:   Mediastinum: SUV max 2.4  Liver: SUV max 3.2     HEAD AND NECK: Normal FDG distribution within the nasopharynx, oropharynx,  larynx and small sized thyroid gland. Limited evaluation of the brain  parenchyma demonstrates no discrete focus of increased FDG uptake. No FDG  avid cervical or supraclavicular lymph nodes. Minimal leftward deviation of  the nasal septum.     THORAX: No hypermetabolic foci within the lung parenchyma. Mildly FDG avid  subcentimeter bilateral axillary lymph nodes, likely inflammatory/reactive.  No other FDG avid thoracic lymph nodes. Coronary artery calcifications.  Aortic valvular calcifications. Small hiatal hernia. Bibasilar bandlike  linear atelectasis or scarring.     ABDOMEN AND PELVIS:   *  FDG avid right external iliac node (image 239): 0.4 x 0.7 cm with SUV  max 4.7  *  FDG avid right inguinal nodes, for example on image 243: up to 1.5 x  0.7 cm with SUV max up to 3.4     Mildly FDG avid subcentimeter left inguinal lymph nodes, likely  reactive/inflammatory.     Normal FDG distribution within the liver, spleen, mild atrophic pancreas,  and adrenal glands. Cholelithiasis. Non-FDG avid exophytic right renal  cyst, possibly containing a septation, measuring 6.0 x 4.9 cm (image 190).  Colonic diverticula, especially within the sigmoid colon, without evidence  of diverticulitis. Multiple pelvic phleboliths.     Below the knee misregistration somewhat limits evaluation.  MUSCULOSKELETAL/EXTREMITIES:   No hypermetabolic foci within the bone marrow. Multilevel spinal  degenerative changes. No aggressive lytic or sclerotic lesion. Right total  hip arthroplasty, with associated surrounding increased uptake, likely  inflammatory. Moderate right and mild left leg subcutaneous edema with mild  increased uptake, possibly inflammatory.      CUTANEOUS/SUBCUTANEOUS:   Tiny cutaneous foci of uptake in the lateral aspect of the right hemipelvis  (images 213-222), for example, on image 220: up to 0.2 cm in thickness with  SUV max up to 3.3     FDG avid subcutaneous nodules, with reference examples as follows:  *  Right inguinal/proximal right thigh subcutaneous soft tissue nodule  (image 268): 1.9 x 2.2 cm with SUV max 6.6  *  Proximal right thigh anteromedial subcutaneous soft tissue nodule  (image 279): 2.7 x 3.6 cm with SUV max 14.7     Multiple cutaneous foci of uptake in the right lower extremity, below the  knee, with reference examples as follows:  *  Curvilinear cutaneous uptake along the anterolateral mid right leg  (image 427): 0.5 cm in thickness with SUV max 5.1  *  Cutaneous uptake within the anteromedial aspect of the mid right leg  (image 431): 0.2 cm in thickness with SUV max 5.2  *  Cutaneous uptake along the medial and posteromedial right leg (images  449-477): up to 0.4 cm in thickness with SUV max up to 7.7     Cutaneous/subcutaneous foci of uptake within the left lower extremity, as  follows:  *  Cutaneous focus of uptake within the posterior distal left thigh  (image 349): 0.3 cm  in thickness with SUV max 3.5  *  At least 3 foci of cutaneous uptake lateral to the left knee (image  [id number]), for example on image 372: up to 0.4 cm in thickness with SUV max  up to 9.3  *  Cutaneous/subcutaneous focus of uptake within the mid left leg (image  425): 0.5 x 0.3 cm with SUV max 4.7     IMPRESSION:   1. FDG avid cutaneous foci of uptake within the bilateral lower  extremities, particularly at and below the knees, is consistent with  malignancy.  2. FDG avid subcutaneous nodules within the right inguinal/right proximal  thigh region is consistent with malignancy.  3. FDG avid right inguinal and right external iliac lymph nodes are  suspicious for nodal metastatic disease.    PATHOLOGY    07/14/18            Procedure Date & Time: 11/03/2021,  09:00                              FLOW CYTOMETRY REPORT          FLOW INTERPRETATION:          PERIPHERAL BLOOD: NO IMMUNOPHENOTYPIC ABNORMALITIES IDENTIFIED          COMMENT:     A population (12%) of lymphocytes is detected. About 53% of these cells     are CD3+ T cells and 30% are NK cells. Expressed as a percentage of     total T cells, CD4+/CD7-T cells are 4.0%, and CD4+/CD26-T cells are     3.9%. The calculated CD4/CD8 ratio is about 5.7. 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 Correlation with     morphology, clinical findings and other laboratory studies is     recommended for a complete evaluation.       Assessment & Plan:   [name] Wall is 88 y.o. FST5 female h/o HTN, HLD, CVA with chronic R-sided weakness, and Mycosis Fungoides since 2019. She recently developed new nodules and lymphadenopathy with biopsy proven advancement to tumor stage disease, Stage 2B CTCL, MF subtype. Given scattered CD30+ disease, we recommend patient to receive brentuximab. We reviewed the dose (1.8mg /kg, with max 180mg ), schedule (every 3 weeks), route (IV), potential benefits and side effects (including but not limited to neutropenia, neuropathy). Given her advanced age and comorbidities, we will plan for a very limited series of treatments (1-3 doses). S/P 2 cycles with mixed response to date.    Stage 2B CTCL, MF subtype  - PET CT shows diffuse skin lesions and dermatopathic vs. Early malignancy in skin draining inguinal nodes  - Flow, TCR, CBC without evidence of blood tumor burden  - Systemic Treatment: Brentuximab 1.8mg /kg IV q3wks, plan for 1-3 doses   Reviewed the potential side effects (focusing on neuropathy, neutropenia)    -- CBC, CMP today look good. Proceed with 3rd infusion today.   - Continue concurrent skin-directed therapy (NBUVB) with primary derm Dr. Chandra Batch  - Dual visit with Dr. Bryson Ha for TBSE in 4wks    2. Open wound  Looks clean today  Continue wound care  Dressed in  clinic today    3. Anorexia, weight loss  - Likely 2/2 brentuximab and advanced age   - Rx for olanzapine 2.5mg  PO QHS for appetite stimulation  - Cautioned against sedating effects of new medicine    4. Constipation x5d  -  Strongly advised to restart dulcolax and/or miralax, as she is already dangerously overdue for BM      RTC in 4wks for dual appt         Syre Knerr Al-Mondhiry, MD, MA  Medical Oncologist, Cutaneous Malignancies Program  Assistant Professor of Medical Education, [facility] [facility] [employer] [employer] [facility] [employer]  [facility]  [id number]  [place], [place] [address]  Phone: [phone]  Fax: [phone]

## 2022-02-09 NOTE — Progress Notes (Signed)
[facility]   Visit Date: 02/09/2022        [name] Wall is a 88 y.o. female patient of Jafar Al-Mondhiry, MD    Since her last visit, she has been doing well.   Patient presents to the infusion center for Cycle3, Day1 Adcetris.   Overall, she states her energy level is good.  The patient is able to perform most usual functions.. Per her daughter, her appetite is decreased. She has no other concerns. There are no social work or other referral needs at this time.    K 3.1 - PA Jenna saw pt and ordered PO potassium prescription.     Diagnosis:  Mycosis fungoides  Treatment on Clinical Trial: Not On Study (NOS)  [facility] - OP ADULT - Lymphoma - Brentuximab        Line Type: Peripheral IV:  Right arm  Blood Return verified prior to administration: Yes      Cryotherapy: No        Vital Signs:  Patient Vitals for the past 12 hrs:   BP Temp Pulse   02/09/22 1309 152/66 -- 75   02/09/22 1109 147/68 97.4 F (36.3 C) 76      Body surface area is 1.48 meters squared.    '  Chemistry        Component Value Date/Time    NA 137 02/09/2022 1012    K 3.1 (L) 02/09/2022 1012    CL 100 02/09/2022 1012    CO2 28 02/09/2022 1012    BUN 9.0 02/09/2022 1012    CREAT 0.8 02/09/2022 1012    GLU 116 (H) 02/09/2022 1012        Component Value Date/Time    CA 9.7 02/09/2022 1012    ALKPHOS 85 02/09/2022 1012    AST 34 02/09/2022 1012    ALT 18 02/09/2022 1012    BILITOTAL 0.6 02/09/2022 1012            Lab Results   Component Value Date    WBC 4.47 02/09/2022    HGB 11.5 02/09/2022    PLT 358 (H) 02/09/2022    NEUTROABS 2.86 02/09/2022       Administrations This Visit       brentuximab vedotin (ADCETRIS) 97 mg in sodium chloride 0.9 % 134.4 mL chemo infusion       Admin Date  02/09/2022  12:00 Action  New Bag Dose  97 mg Rate  268.8 mL/hr Route  Intravenous Ordering Provider  Al-Mondhiry, Darol Destine, MD                    Chemo Given within the past 12 hours:   "Chemo/Biotherapy Given" (last 12 hours)        Date/Time Action User Medication Dose Rate Dose Volume (mL) Rate    02/09/22 1200 New Bag    SM brentuximab vedotin (ADCETRIS) 97 mg in sodium chloride 0.9 % 134.4 mL chemo infusion 97 mg 268.8 mL/hr *97 mg                   Hypersensitivity Reaction Noted: No  IV Post Hydration: No, not required  Treatment Outcome:Patient tolerated treatment well.  Blood return verified after administration: Yes  PIV flushed and removed.    Education: Patient/Family educated regarding the expected outcomes/side effects possible interactions of treatments and medications. Patient verbalizes understanding.    Follow-up Plan: Discharged in stable condition. Accompanied by, daughter. Instructed to  contact physician if any complications or unmanageable symptoms occur.    Patient will follow-up with their physician.      Ronna Polio, RN    02/09/2022  1:46 PM

## 2022-02-17 ENCOUNTER — Ambulatory Visit: Payer: Medicare Other

## 2022-02-24 ENCOUNTER — Ambulatory Visit: Payer: Medicare Other | Attending: Foot & Ankle Surgery

## 2022-02-24 DIAGNOSIS — S81801A Unspecified open wound, right lower leg, initial encounter: Secondary | ICD-10-CM | POA: Insufficient documentation

## 2022-02-24 DIAGNOSIS — Z8673 Personal history of transient ischemic attack (TIA), and cerebral infarction without residual deficits: Secondary | ICD-10-CM | POA: Insufficient documentation

## 2022-02-24 DIAGNOSIS — L97919 Non-pressure chronic ulcer of unspecified part of right lower leg with unspecified severity: Secondary | ICD-10-CM | POA: Insufficient documentation

## 2022-02-24 DIAGNOSIS — E785 Hyperlipidemia, unspecified: Secondary | ICD-10-CM | POA: Insufficient documentation

## 2022-02-24 DIAGNOSIS — I10 Essential (primary) hypertension: Secondary | ICD-10-CM | POA: Insufficient documentation

## 2022-02-24 DIAGNOSIS — S81801D Unspecified open wound, right lower leg, subsequent encounter: Secondary | ICD-10-CM

## 2022-02-24 DIAGNOSIS — Z85828 Personal history of other malignant neoplasm of skin: Secondary | ICD-10-CM | POA: Insufficient documentation

## 2022-03-08 ENCOUNTER — Telehealth: Payer: Self-pay | Admitting: Student in an Organized Health Care Education/Training Program

## 2022-03-08 ENCOUNTER — Telehealth: Payer: Self-pay | Admitting: Dermatology

## 2022-03-08 NOTE — Telephone Encounter (Signed)
Called patient to confirm appointment for 03/09/2022: Advised patient to arrive 15-20 mins prior to appointment time to complete any paperwork needed. Advised patient of visitor and mask requirements/policies.

## 2022-03-09 ENCOUNTER — Ambulatory Visit
Payer: Medicare Other | Attending: Student in an Organized Health Care Education/Training Program | Admitting: Student in an Organized Health Care Education/Training Program

## 2022-03-09 ENCOUNTER — Ambulatory Visit (INDEPENDENT_AMBULATORY_CARE_PROVIDER_SITE_OTHER): Payer: Medicare Other | Admitting: Dermatology

## 2022-03-09 ENCOUNTER — Encounter: Payer: Self-pay | Admitting: Dermatology

## 2022-03-09 ENCOUNTER — Other Ambulatory Visit: Payer: Medicare Other

## 2022-03-09 VITALS — BP 134/58 | HR 75 | Temp 97.7°F | Resp 14 | Ht 60.0 in | Wt 111.0 lb

## 2022-03-09 DIAGNOSIS — S81801A Unspecified open wound, right lower leg, initial encounter: Secondary | ICD-10-CM | POA: Insufficient documentation

## 2022-03-09 DIAGNOSIS — C8400 Mycosis fungoides, unspecified site: Secondary | ICD-10-CM

## 2022-03-09 DIAGNOSIS — C8405 Mycosis fungoides, lymph nodes of inguinal region and lower limb: Secondary | ICD-10-CM | POA: Insufficient documentation

## 2022-03-09 NOTE — Progress Notes (Signed)
[facility]       Date Time: March 09, 2022   Patient Name: [name] Wall,[name] Wall   Referring Provider: Referring, Not On File,*    Primary Dx: Mycosis Fungoides, stage 2B      ONCOLOGY CARE TEAM  Dermatology: Russella Dar, MD  Derm Oncology: Ferne Reus, MD  Radiation Oncology:  Quentin Mulling, MD      HISTORY OF PRESENT ILLNESS  [name] Wall is 88 y.o. female h/o HTN, HLD, CVA with chronic R-sided weakness, and Mycosis Fungoides since 2019. She was previously treated with NBUVB.  She established care here at [facility] in November 2022 when she relocated to [facility] for MF. She developed tumor stage MF in Jan 2023 despite NVUBV, therefore, consensus recommendation was to treat with brentuximab.     She's here follow s/p brentuximab C3, in the company of her daughter. She has experienced increased fatigue and anorexia at last visit which has been improving in her time off of treatment, but lost 9lbs overall since starting. Eating slowly improving. They did not pick up the Rx for olanzapine provided last visit. She also has had increased hair loss in this time.    She continues to tolerate NVUBV two times a week, 45 sec per session. She has a chronic open skin lesion in right posterior ankle without bleeding or drainage, improving, but has noticed a new R anterior area of skin breakdown, using gentamicin topicals as well as medi-honey. Continues wound care as outpatient.     She has chronic left side weakness from CVA in 2022, and R hand/wrist weakness s/p broken wrist. Denies any peripheral neuropathy such as numbness or tingling.     No history of other skin cancers. Negative family history of skin cancer and lymphoma.     MEDICATIONS:    Current Outpatient Medications:     ALPRAZolam (XANAX) 0.25 MG tablet, Take 1 tablet (0.25 mg) by mouth daily as needed for Anxiety, Disp: 30 tablet, Rfl: 0    amLODIPine (NORVASC) 10 MG tablet, Take 1 tablet (10 mg) by mouth daily, Disp: , Rfl:      aspirin 81 MG EC tablet, Take 1 tablet (81 mg) by mouth, Disp: , Rfl:     atorvastatin (LIPITOR) 40 MG tablet, Take 1 tablet (40 mg) by mouth every 24 hours, Disp: , Rfl:     ferrous gluconate (FERGON) 324 MG tablet, Take 1 tablet (324 mg) by mouth every morning with breakfast, Disp: 90 tablet, Rfl: 3    gentamicin (GARAMYCIN) 0.1 % ointment, APPLY UP TO TWICE A DAY ON CRUSTED AREAS UNTIL RESOLVED, Disp: , Rfl:     hydrALAZINE (APRESOLINE) 50 MG tablet, Take 1 tablet (50 mg) by mouth 3 (three) times daily with meals, Disp: , Rfl:     hydrOXYzine (ATARAX) 10 MG tablet, Take 1 tablet (10 mg) by mouth every 8 (eight) hours as needed for Itching, Disp: 30 tablet, Rfl: 1    imiquimod (ALDARA) 5 % cream, Apply nightly three times a week (Monday, Wednesday, Friday) to behind left knee and right ankle, Disp: 96 each, Rfl: 2    latanoprost (XALATAN) 0.005 % ophthalmic solution, INSTILL 1 DROP INTO EACH EYE AT BEDTIME, Disp: , Rfl:     levETIRAcetam (KEPPRA) 500 MG tablet, Take 1 tablet (500 mg) by mouth 2 (two) times daily, Disp: , Rfl:     levothyroxine (SYNTHROID) 25 MCG tablet, Take 1 tablet (25 mcg) by mouth daily, Disp: , Rfl:  losartan (COZAAR) 50 MG tablet, Take 1 tablet (50 mg) by mouth daily, Disp: , Rfl:     OLANZapine (ZyPREXA) 2.5 MG tablet, Take 1 tablet (2.5 mg) by mouth nightly, Disp: 30 tablet, Rfl: 0    pantoprazole (PROTONIX) 40 MG tablet, Take 1 tablet (40 mg) by mouth daily, Disp: 90 tablet, Rfl: 1    SSD 1 % cream, , Disp: , Rfl:     tacrolimus (PROTOPIC) 0.1 % ointment, Apply topically, Disp: , Rfl:     triamcinolone (KENALOG) 0.1 % cream, Apply topically, Disp: , Rfl:     triamcinolone (KENALOG) 0.1 % ointment, as needed, Disp: , Rfl:     Allergies   Allergen Reactions    Penicillins      Past Medical History:   Diagnosis Date    Hypercholesteremia     Hypertension     Seizures     Stroke      Past Surgical History:   Procedure Laterality Date    HAND, CLOSED REDUCTION  01/29/2019    right wrist      Family History   Problem Relation Age of Onset    Breast cancer Sister      Social History     Socioeconomic History    Marital status: Widowed   Tobacco Use    Smoking status: Never    Smokeless tobacco: Never   Vaping Use    Vaping status: Never Used   Substance and Sexual Activity    Alcohol use: Not Currently    Drug use: Never    Sexual activity: Not Currently     PHYSICAL EXAM:  Vitals:    03/09/22 1053   BP: 134/58   Pulse: 75   Resp: 14   Temp: 97.7 F (36.5 C)   SpO2: 100%     Wt Readings from Last 3 Encounters:   03/09/22 50.3 kg (111 lb)   03/09/22 50.3 kg (111 lb)   02/09/22 51.7 kg (114 lb)     ECOG PS: 1-2  General: NAD, seated in wheelchair, very slow in standing up or walking  Eyes: Anicteric, normal conjunctiva  ENT: No sores in mouth  CV: RRR  RESP: CTA B  GI: NT/ND, no HSM  Neuro: AAO x 3, slow/stuttering gait, R > L weakness  MS: no swelling in arms or legs  SKIN:   - Head including face - diffuse hyperpigmentation of forehead, thinning hair with receding hair line  - Right lower extremity -  multiple hyperpigmented indurated plaques on right shin, distal posterior calf and ankle, largest ~4-5cm on anterior mid shin, most 2-3cm on ankle and distal posterior calf. Anterior mid shin and anterior/medial ankle plaques with superficial pink erosion and one area of yellow-brown crust; scattered hyperpigmented patches on right thigh  - Left lower extremity - scattered hyperpigmented patches on upper and lower leg  - Right upper extremity - single hyperpigmented patch on right forearm  LYMPH: negative cervical suplarclavicular axillary     02/09/22        01/19/22            12/29/21  No significant interval change in MF  No sign of infection in right posterior ankle open wound    12/14/21              LABS:  Lab Results   Component Value Date    WBC 4.47 02/09/2022    HGB 11.5 02/09/2022    HCT 35.1 02/09/2022  MCV 78.5 02/09/2022    PLT 358 (H) 02/09/2022     Lab Results   Component Value Date     CREAT 0.8 02/09/2022    BUN 9.0 02/09/2022    NA 137 02/09/2022    K 3.1 (L) 02/09/2022    CL 100 02/09/2022    CO2 28 02/09/2022     Lab Results   Component Value Date    ALT 18 02/09/2022    AST 34 02/09/2022    ALKPHOS 85 02/09/2022    BILITOTAL 0.6 02/09/2022         IMAGING:  PET CT WHOLE BODY  FINDINGS:   Mediastinum: SUV max 2.4  Liver: SUV max 3.2     HEAD AND NECK: Normal FDG distribution within the nasopharynx, oropharynx,  larynx and small sized thyroid gland. Limited evaluation of the brain  parenchyma demonstrates no discrete focus of increased FDG uptake. No FDG  avid cervical or supraclavicular lymph nodes. Minimal leftward deviation of  the nasal septum.     THORAX: No hypermetabolic foci within the lung parenchyma. Mildly FDG avid  subcentimeter bilateral axillary lymph nodes, likely inflammatory/reactive.  No other FDG avid thoracic lymph nodes. Coronary artery calcifications.  Aortic valvular calcifications. Small hiatal hernia. Bibasilar bandlike  linear atelectasis or scarring.     ABDOMEN AND PELVIS:   *  FDG avid right external iliac node (image 239): 0.4 x 0.7 cm with SUV  max 4.7  *  FDG avid right inguinal nodes, for example on image 243: up to 1.5 x  0.7 cm with SUV max up to 3.4     Mildly FDG avid subcentimeter left inguinal lymph nodes, likely  reactive/inflammatory.     Normal FDG distribution within the liver, spleen, mild atrophic pancreas,  and adrenal glands. Cholelithiasis. Non-FDG avid exophytic right renal  cyst, possibly containing a septation, measuring 6.0 x 4.9 cm (image 190).  Colonic diverticula, especially within the sigmoid colon, without evidence  of diverticulitis. Multiple pelvic phleboliths.     Below the knee misregistration somewhat limits evaluation.  MUSCULOSKELETAL/EXTREMITIES:   No hypermetabolic foci within the bone marrow. Multilevel spinal  degenerative changes. No aggressive lytic or sclerotic lesion. Right total  hip arthroplasty, with associated  surrounding increased uptake, likely  inflammatory. Moderate right and mild left leg subcutaneous edema with mild  increased uptake, possibly inflammatory.     CUTANEOUS/SUBCUTANEOUS:   Tiny cutaneous foci of uptake in the lateral aspect of the right hemipelvis  (images 213-222), for example, on image 220: up to 0.2 cm in thickness with  SUV max up to 3.3     FDG avid subcutaneous nodules, with reference examples as follows:  *  Right inguinal/proximal right thigh subcutaneous soft tissue nodule  (image 268): 1.9 x 2.2 cm with SUV max 6.6  *  Proximal right thigh anteromedial subcutaneous soft tissue nodule  (image 279): 2.7 x 3.6 cm with SUV max 14.7     Multiple cutaneous foci of uptake in the right lower extremity, below the  knee, with reference examples as follows:  *  Curvilinear cutaneous uptake along the anterolateral mid right leg  (image 427): 0.5 cm in thickness with SUV max 5.1  *  Cutaneous uptake within the anteromedial aspect of the mid right leg  (image 431): 0.2 cm in thickness with SUV max 5.2  *  Cutaneous uptake along the medial and posteromedial right leg (images  449-477): up to 0.4 cm in thickness with SUV max up  to 7.7     Cutaneous/subcutaneous foci of uptake within the left lower extremity, as  follows:  *  Cutaneous focus of uptake within the posterior distal left thigh  (image 349): 0.3 cm in thickness with SUV max 3.5  *  At least 3 foci of cutaneous uptake lateral to the left knee (image  [phone]), for example on image 372: up to 0.4 cm in thickness with SUV max  up to 9.3  *  Cutaneous/subcutaneous focus of uptake within the mid left leg (image  425): 0.5 x 0.3 cm with SUV max 4.7     IMPRESSION:   1. FDG avid cutaneous foci of uptake within the bilateral lower  extremities, particularly at and below the knees, is consistent with  malignancy.  2. FDG avid subcutaneous nodules within the right inguinal/right proximal  thigh region is consistent with malignancy.  3. FDG avid right  inguinal and right external iliac lymph nodes are  suspicious for nodal metastatic disease.    PATHOLOGY    07/14/18            Procedure Date & Time: 11/03/2021, 09:00                              FLOW CYTOMETRY REPORT          FLOW INTERPRETATION:          PERIPHERAL BLOOD: NO IMMUNOPHENOTYPIC ABNORMALITIES IDENTIFIED          COMMENT:     A population (12%) of lymphocytes is detected. About 53% of these cells     are CD3+ T cells and 30% are NK cells. Expressed as a percentage of     total T cells, CD4+/CD7-T cells are 4.0%, and CD4+/CD26-T cells are     3.9%. The calculated CD4/CD8 ratio is about 5.7. 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 Correlation with     morphology, clinical findings and other laboratory studies is     recommended for a complete evaluation.       Assessment & Plan:   [name] Wall is 88 y.o. FST5 female h/o HTN, HLD, CVA with chronic R-sided weakness, and Mycosis Fungoides since 2019. She recently developed new nodules and lymphadenopathy with biopsy proven advancement to tumor stage disease, Stage 2B CTCL, MF subtype. Given scattered CD30+ disease, we recommend patient to receive brentuximab. We reviewed the dose (1.8mg /kg, with max 180mg ), schedule (every 3 weeks), route (IV), potential benefits and side effects (including but not limited to neutropenia, neuropathy). Given her advanced age and comorbidities, we will plan for a very limited series of treatments (1-3 doses). S/P 2 cycles with mixed response to date.    Stage 2B CTCL, MF subtype  - PET CT shows diffuse skin lesions and dermatopathic vs. Early malignancy in skin draining inguinal nodes  - Flow, TCR, CBC without evidence of blood tumor burden  - Systemic Treatment: s/p Brentuximab 1.8mg /kg IV q3wks x3 doses    -- c/b fatigue, nausea, anorexia, weight loss, limiting further treatment    -- will consider re-treatment in the future if disease recurrence  - Continue concurrent skin-directed  therapy (NBUVB) with primary derm Dr. Chandra Batch    -- advised to increase light box treatment time as tolerated, ideally up to (currently 6:45)  - Dual visit with Dr. Bryson Ha for TBSE in 6-8wks    2. Open wound, 2/2 treatment response   -  Looks clean today with minor bacterial crusting  - Continue topical gentamicin + medi-honey  - Continue wound care    3. Anorexia, weight loss  - Likely 2/2 brentuximab and advanced age, improving since time off treatment  - Rx for olanzapine 2.5mg  PO QHS for appetite stimulation, never picked up d/t concerns about psychiatric effects  - Discussed use of medications with multiple indications, consider use if pursuing re-treatment    RTC in 6-8wks        Ayvion Kavanagh Al-Mondhiry, MD, MA  Medical Oncologist, Cutaneous Malignancies Program  Assistant Professor of Medical Education, [employer]  [facility]  [id number]  [place], [place] [address]  Phone: [phone]  Fax: [phone]

## 2022-03-09 NOTE — Progress Notes (Signed)
HISTORY OF PRESENT ILLNESS    [name] Wall is 88 y.o. 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  She was previously treated with NBUVB.  I met her in November 2022 when she relocated to [facility] and noted Stage 1B, limited thin patch disease.  The patient is post-CVA and in her 90's making phototherapy challenging but doable.  I recommended topical imiquimod MWF.  She used this with a brisk inflammatory response.  She has held imiquimod since 10/01/21.  Using TAC 0.1% prn.  Receiving NBUVB 6+ minutes per session twice weekly.     In early January 2023, her daughter noticed 3 new lesions which are much more raised.  She presents earlier than scheduled for urgent examination.  The MD at [facility] (phototherapy site) performed 2 punch biopsies of the new thick plaques last week.  Diagnosis was tumor stage MF, no LCT, scattered CD 30+.  She developed an open wound at the site of a tumor on the distal right leg, which was painful. In Feb 2023, morphologic change to plaques/tumors on LEs and new inguinal LAN were noted. Flow and PET updated. Opted to pursue Brentuximab treatment.     Since last visit, patient has had 3 infusions of Brentuximab (1.8 mg/kg IV q 3weeks, last 02/09/22). Patient experienced side effects including weight loss, appetite suppresion, and diarrhea/constipation, which are now improved over last few weeks. She has lost about ~9 lbs (120 > 111lbs) since starting treatment. Daughter did not start the medication suggested for appetite stimulation after reading up on it online but did add some calorie supplements that she thinks has been helpful. Over last week in particular, patient's appetite is much improved and back at baseline. About 1 week ago, daughter also notes that a couple of the larger plaques on the anterior shin (mid and distal) have superficially opened and become shallow wounds. There is no pain or pruritus associated. Patient was previously attending wound care clinic for open  wounds on her posterior right calf, which are healed and doing better. Currently using alcohol wipe, gentamicin, and medihoney on the wounds. Still doing nbUVB 2x/week, have self-capped time due to concern that it was causing hair loss and the plaques on the right lower lower leg to open up and become wounds.     No history of other skin cancers. Negative family history of skin cancer and lymphoma.       Current Outpatient Medications:     ALPRAZolam (XANAX) 0.25 MG tablet, Take 1 tablet (0.25 mg) by mouth daily as needed for Anxiety, Disp: 30 tablet, Rfl: 0    amLODIPine (NORVASC) 10 MG tablet, Take 1 tablet (10 mg) by mouth daily, Disp: , Rfl:     aspirin 81 MG EC tablet, Take 1 tablet (81 mg) by mouth, Disp: , Rfl:     atorvastatin (LIPITOR) 40 MG tablet, Take 1 tablet (40 mg) by mouth every 24 hours, Disp: , Rfl:     ferrous gluconate (FERGON) 324 MG tablet, Take 1 tablet (324 mg) by mouth every morning with breakfast, Disp: 90 tablet, Rfl: 3    gentamicin (GARAMYCIN) 0.1 % ointment, APPLY UP TO TWICE A DAY ON CRUSTED AREAS UNTIL RESOLVED, Disp: , Rfl:     hydrALAZINE (APRESOLINE) 50 MG tablet, Take 1 tablet (50 mg) by mouth 3 (three) times daily with meals, Disp: , Rfl:     hydrOXYzine (ATARAX) 10 MG tablet, Take 1 tablet (10 mg) by mouth every 8 (eight) hours as needed for Itching,  Disp: 30 tablet, Rfl: 1    imiquimod (ALDARA) 5 % cream, Apply nightly three times a week (Monday, Wednesday, Friday) to behind left knee and right ankle, Disp: 96 each, Rfl: 2    latanoprost (XALATAN) 0.005 % ophthalmic solution, INSTILL 1 DROP INTO EACH EYE AT BEDTIME, Disp: , Rfl:     levETIRAcetam (KEPPRA) 500 MG tablet, Take 1 tablet (500 mg) by mouth 2 (two) times daily, Disp: , Rfl:     levothyroxine (SYNTHROID) 25 MCG tablet, Take 1 tablet (25 mcg) by mouth daily, Disp: , Rfl:     losartan (COZAAR) 50 MG tablet, Take 1 tablet (50 mg) by mouth daily, Disp: , Rfl:     OLANZapine (ZyPREXA) 2.5 MG tablet, Take 1 tablet (2.5 mg)  by mouth nightly, Disp: 30 tablet, Rfl: 0    pantoprazole (PROTONIX) 40 MG tablet, Take 1 tablet (40 mg) by mouth daily, Disp: 90 tablet, Rfl: 1    SSD 1 % cream, , Disp: , Rfl:     tacrolimus (PROTOPIC) 0.1 % ointment, Apply topically, Disp: , Rfl:     triamcinolone (KENALOG) 0.1 % cream, Apply topically, Disp: , Rfl:     triamcinolone (KENALOG) 0.1 % ointment, as needed, Disp: , Rfl:       Allergies   Allergen Reactions    Penicillins      Past Medical History:   Diagnosis Date    Hypercholesteremia     Hypertension     Seizures     Stroke      Past Surgical History:   Procedure Laterality Date    HAND, CLOSED REDUCTION  01/29/2019    right wrist     Family History   Problem Relation Age of Onset    Breast cancer Sister      Social History     Socioeconomic History    Marital status: Widowed   Tobacco Use    Smoking status: Never    Smokeless tobacco: Never   Vaping Use    Vaping status: Never Used   Substance and Sexual Activity    Alcohol use: Not Currently    Drug use: Never    Sexual activity: Not Currently     PHYSICAL EXAM:  There were no vitals filed for this visit.  Note.General appearance: NAD, conversant.A total body skin examination is completed including palpation of scalp and inspection of hair of scalp, eyebrows, face, chest and extremities and inspection of eccrine and apocrine glands. The following anatomic skin sites are examined with findings recorded:  Head including face - diffuse hyperpigmentation of forehead  Neck - no lesions of concern  Chest including breasts, and axillae - no lesions of concern  Abdomen - on right abdomen, there are few hyperpigmented patches  Genitalia, groin, buttocks - no lesions of concern  Back - no lesions of concern  Right upper extremity - single hyperpigmented patch on right forearm  Left upper extremity - no lesions of concern  Right lower extremity -  multiple hyperpigmented indurated plaques on right shin, distal posterior calf and ankle, largest ~4-5cm on  anterior mid shin, most 2-3cm on ankle and distal posterior calf. Anterior mid shin and anterior/medial ankle plaques with superficial pink erosion and one area of yellow-brown crust; scattered hyperpigmented patches on right thigh  Left lower extremity - scattered hyperpigmented patches on upper and lower leg     Examination of lymph node basins is completed, including the H and N, axilla and groin. There is a  single mobile 1.5cm soft right inguinal lymph node    Pathology reports     07/14/18        Procedure Date & Time: 11/03/2021, 09:00                              FLOW CYTOMETRY REPORT          FLOW INTERPRETATION:          PERIPHERAL BLOOD: NO IMMUNOPHENOTYPIC ABNORMALITIES IDENTIFIED          COMMENT:     A population (12%) of lymphocytes is detected. About 53% of these cells     are CD3+ T cells and 30% are NK cells. Expressed as a percentage of     total T cells, CD4+/CD7-T cells are 4.0%, and CD4+/CD26-T cells are     3.9%. The calculated CD4/CD8 ratio is about 5.7. 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 Correlation with     morphology, clinical findings and other laboratory studies is     recommended for a complete evaluation.        PET CT WHOLE BODY     HISTORY: Mycosis fungoides, primarily cutaneous lymphoma (diagnosed in  2019). Post imiquimod, which has been held since 10/01/2021. Restaging.     TECHNIQUE:      Dose: 9.99  mCi F-18 fluorodeoxyglucose (F-18 FDG)  Serum Glucose: 104 mg/dL  Time from A-21F-18 FDG injection to scan: 62 min     The patient fasted prior to this examination for greater than six hours.  Positron emission tomography (PET) images were obtained from the convexity  of the calvarium through the feet. As part of this examination, a  nondiagnostic CT scan without intravenous contrast was obtained over the  same region utilizing a low tube current technique for anatomical  correlation and attenuation correction. Oral contrast was provided. Images  were  reconstructed in the axial, sagittal and coronal planes.     CORRELATION: None available.     FINDINGS:      Mediastinum: SUV max 2.4  Liver: SUV max 3.2     HEAD AND NECK: Normal FDG distribution within the nasopharynx, oropharynx,  larynx and small sized thyroid gland. Limited evaluation of the brain  parenchyma demonstrates no discrete focus of increased FDG uptake. No FDG  avid cervical or supraclavicular lymph nodes. Minimal leftward deviation of  the nasal septum.     THORAX: No hypermetabolic foci within the lung parenchyma. Mildly FDG avid  subcentimeter bilateral axillary lymph nodes, likely inflammatory/reactive.  No other FDG avid thoracic lymph nodes. Coronary artery calcifications.  Aortic valvular calcifications. Small hiatal hernia. Bibasilar bandlike  linear atelectasis or scarring.     ABDOMEN AND PELVIS:   *  FDG avid right external iliac node (image 239): 0.4 x 0.7 cm with SUV  max 4.7  *  FDG avid right inguinal nodes, for example on image 243: up to 1.5 x  0.7 cm with SUV max up to 3.4     Mildly FDG avid subcentimeter left inguinal lymph nodes, likely  reactive/inflammatory.     Normal FDG distribution within the liver, spleen, mild atrophic pancreas,  and adrenal glands. Cholelithiasis. Non-FDG avid exophytic right renal  cyst, possibly containing a septation, measuring 6.0 x 4.9 cm (image 190).  Colonic diverticula, especially within the sigmoid colon, without evidence  of diverticulitis. Multiple pelvic phleboliths.  Below the knee misregistration somewhat limits evaluation.  MUSCULOSKELETAL/EXTREMITIES:   No hypermetabolic foci within the bone marrow. Multilevel spinal  degenerative changes. No aggressive lytic or sclerotic lesion. Right total  hip arthroplasty, with associated surrounding increased uptake, likely  inflammatory. Moderate right and mild left leg subcutaneous edema with mild  increased uptake, possibly inflammatory.     CUTANEOUS/SUBCUTANEOUS:   Tiny cutaneous foci of  uptake in the lateral aspect of the right hemipelvis  (images 213-222), for example, on image 220: up to 0.2 cm in thickness with  SUV max up to 3.3     FDG avid subcutaneous nodules, with reference examples as follows:  *  Right inguinal/proximal right thigh subcutaneous soft tissue nodule  (image 268): 1.9 x 2.2 cm with SUV max 6.6  *  Proximal right thigh anteromedial subcutaneous soft tissue nodule  (image 279): 2.7 x 3.6 cm with SUV max 14.7     Multiple cutaneous foci of uptake in the right lower extremity, below the  knee, with reference examples as follows:  *  Curvilinear cutaneous uptake along the anterolateral mid right leg  (image 427): 0.5 cm in thickness with SUV max 5.1  *  Cutaneous uptake within the anteromedial aspect of the mid right leg  (image 431): 0.2 cm in thickness with SUV max 5.2  *  Cutaneous uptake along the medial and posteromedial right leg (images  449-477): up to 0.4 cm in thickness with SUV max up to 7.7     Cutaneous/subcutaneous foci of uptake within the left lower extremity, as  follows:  *  Cutaneous focus of uptake within the posterior distal left thigh  (image 349): 0.3 cm in thickness with SUV max 3.5  *  At least 3 foci of cutaneous uptake lateral to the left knee (image  [phone]), for example on image 372: up to 0.4 cm in thickness with SUV max  up to 9.3  *  Cutaneous/subcutaneous focus of uptake within the mid left leg (image  425): 0.5 x 0.3 cm with SUV max 4.7     IMPRESSION:      1. FDG avid cutaneous foci of uptake within the bilateral lower  extremities, particularly at and below the knees, is consistent with  malignancy.  2. FDG avid subcutaneous nodules within the right inguinal/right proximal  thigh region is consistent with malignancy.  3. FDG avid right inguinal and right external iliac lymph nodes are  suspicious for nodal metastatic disease.    Assessment & Plan:   Pt is a FST69 female with Stage 2B CTCL (T3, N1, M0, B0)  MF subtype    Stage 2B CTCL, MF  subtype  - Morphologic changes and LAN emerged February 2023, now s/p Brentuximab x3 infusions (last 02/09/22), still receiving nbUVB 2x/week  - Biopsy shows CD30+ tumors  - PET CT Feb 2023 shows diffuse skin lesions and dermatopathic vs. Early malignancy in skin draining inguinal nodes  - Flow, TCR, CBC Feb 2023 without evidence of blood tumor burden  - Discussed Brentuximab may be used again in the future but given side effects patient experienced, will hold further infusion for now. Side effects have now improved. Encouraged patient and daughter to continue to monitor weight loss and encouraged sufficient calorie intact.   - Continue skin directed NBUVB, encouraged continuing uptitration of time to maximize benefit of phototherapy.   - Multi-D follow up in 6-8 weeks    2. Superficial erosion of plaques on right shin  - Previous open  wounds now improved on right posterior calf with wound care, now with new superficial erosions on right anterior shin tumors  - Continue alcohol, gentamicin, medihoney, and wound care. Small area of crusting suggestive of impetiginization should be treated by the gentamicin. Encouraged patient's daughter to alert [place] if this worsens.    Marisue Ivan MD - [facility] Resident    I personally reviewed the patients history, pathology when relevant, and completed an independent skin examination. I agree with all components of this written note. I have made addendums where/if needed. JD    Ferne Reus, MD  Director, Cutaneous Lymphoma and   High Risk/Transplant Dermatology   [facility]  [facility]  [address]  T [phone]  F [phone]  [email or web address]

## 2022-04-06 ENCOUNTER — Other Ambulatory Visit: Payer: Self-pay

## 2022-04-06 ENCOUNTER — Encounter: Payer: Self-pay | Admitting: Dermatology

## 2022-04-06 ENCOUNTER — Telehealth: Payer: Self-pay

## 2022-04-06 DIAGNOSIS — B999 Unspecified infectious disease: Secondary | ICD-10-CM

## 2022-04-06 MED ORDER — DOXYCYCLINE HYCLATE 100 MG PO CAPS
100.0000 mg | ORAL_CAPSULE | Freq: Two times a day (BID) | ORAL | 0 refills | Status: AC
Start: 2022-04-06 — End: 2022-04-16

## 2022-04-06 NOTE — Telephone Encounter (Signed)
Patients daughter reports right leg weakness, pain, new wound eruptions, raised areas that look "moldy", and new hard knot to upper right thigh.     There are areas that have healed with wound care but she reports new eruptions/open wounds. Also raised areas that look moldy (grey and cloudy). Reports bleeding to wounds and yellow drainage.     Hard knot to right upper thigh developed a few days ago.     Currently has leg wounds open to air but does place bandage on them. She cleans with alcohol prep and or peroxide and then applies gentamicin.     Daughter is working on Child psychotherapist now.

## 2022-04-07 NOTE — Telephone Encounter (Signed)
Dr. Bryson Ha and Dr. Dickey Gave will see the patient on Tuesday 04/13/22 at 9 and 9 15 AM. The patient will not receive an infusion same day per Dr. Dickey Gave.     The patient was recommended to start doxycycline 100 mg twice daily for 10 days. The patient started doxycycline 04/06/22 evening.     Confirmed appointments for 7/18 with the patients daughter, [name]. She had no further questions or concerns at this time. Frederic Jericho to call back before 7/18 if she had new concerns or questions.

## 2022-04-12 NOTE — Progress Notes (Unsigned)
HISTORY OF PRESENT ILLNESS    [name] Wall is 88 y.o. 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  She was previously treated with NBUVB.  I met her in November 2022 when she relocated to [facility] and noted Stage 1B, limited thin patch disease.  The patient is post-CVA and in her 90's making phototherapy challenging but doable.  I recommended topical imiquimod MWF.  She used this with a brisk inflammatory response.  She has held imiquimod since 10/01/21.  Using TAC 0.1% prn.  Receiving NBUVB 6+ minutes per session twice weekly.     In early January 2023, her daughter noticed 3 new lesions which are much more raised.  She presents earlier than scheduled for urgent examination.  The MD at [facility] (phototherapy site) performed 2 punch biopsies of the new thick plaques last week.  Diagnosis was tumor stage MF, no LCT, scattered CD 30+.  She developed an open wound at the site of a tumor on the distal right leg, which was painful. In Feb 2023, morphologic change to plaques/tumors on LEs and new inguinal LAN were noted. Flow and PET updated. Opted to pursue Brentuximab treatment.     Since last visit, patient has had 3 infusions of Brentuximab (1.8 mg/kg IV q 3weeks, last 02/09/22). Patient experienced side effects including weight loss, appetite suppresion, and diarrhea/constipation, which are now improved over last few weeks. She has lost about ~9 lbs (120 > 111lbs) since starting treatment. Daughter did not start the medication suggested for appetite stimulation after reading up on it online but did add some calorie supplements that she thinks has been helpful. Over last week in particular, patient's appetite is much improved and back at baseline. About 1 week ago, daughter also notes that a couple of the larger plaques on the anterior shin (mid and distal) have superficially opened and become shallow wounds. There is no pain or pruritus associated. Patient was previously attending wound care clinic for open  wounds on her posterior right calf, which are healed and doing better. Currently using alcohol wipe, gentamicin, and medihoney on the wounds. Unfortunately, she has new additional lesions on the right leg. Still doing nbUVB 2x/week, have self-capped time due to concern that it was causing hair loss and the plaques on the right lower lower leg to open up and become wounds.     No history of other skin cancers. Negative family history of skin cancer and lymphoma.       Current Outpatient Medications:     ALPRAZolam (XANAX) 0.25 MG tablet, Take 1 tablet (0.25 mg) by mouth daily as needed for Anxiety, Disp: 30 tablet, Rfl: 0    amLODIPine (NORVASC) 10 MG tablet, Take 1 tablet (10 mg) by mouth daily, Disp: , Rfl:     aspirin 81 MG EC tablet, Take 1 tablet (81 mg) by mouth, Disp: , Rfl:     atorvastatin (LIPITOR) 40 MG tablet, Take 1 tablet (40 mg) by mouth every 24 hours, Disp: , Rfl:     doxycycline (VIBRAMYCIN) 100 MG capsule, Take 1 capsule (100 mg) by mouth 2 (two) times daily for 10 days, Disp: 20 capsule, Rfl: 0    ferrous gluconate (FERGON) 324 MG tablet, Take 1 tablet (324 mg) by mouth every morning with breakfast, Disp: 90 tablet, Rfl: 3    gentamicin (GARAMYCIN) 0.1 % ointment, APPLY UP TO TWICE A DAY ON CRUSTED AREAS UNTIL RESOLVED, Disp: , Rfl:     hydrALAZINE (APRESOLINE) 50 MG tablet, Take 1 tablet (  50 mg) by mouth 3 (three) times daily with meals, Disp: , Rfl:     hydrOXYzine (ATARAX) 10 MG tablet, Take 1 tablet (10 mg) by mouth every 8 (eight) hours as needed for Itching, Disp: 30 tablet, Rfl: 1    imiquimod (ALDARA) 5 % cream, Apply nightly three times a week (Monday, Wednesday, Friday) to behind left knee and right ankle, Disp: 96 each, Rfl: 2    latanoprost (XALATAN) 0.005 % ophthalmic solution, INSTILL 1 DROP INTO EACH EYE AT BEDTIME, Disp: , Rfl:     levETIRAcetam (KEPPRA) 500 MG tablet, Take 1 tablet (500 mg) by mouth 2 (two) times daily, Disp: , Rfl:     levothyroxine (SYNTHROID) 25 MCG tablet,  Take 1 tablet (25 mcg) by mouth daily, Disp: , Rfl:     losartan (COZAAR) 50 MG tablet, Take 1 tablet (50 mg) by mouth daily, Disp: , Rfl:     pantoprazole (PROTONIX) 40 MG tablet, Take 1 tablet (40 mg) by mouth daily, Disp: 90 tablet, Rfl: 1    SSD 1 % cream, , Disp: , Rfl:     tacrolimus (PROTOPIC) 0.1 % ointment, Apply topically, Disp: , Rfl:     triamcinolone (KENALOG) 0.1 % cream, Apply topically, Disp: , Rfl:     triamcinolone (KENALOG) 0.1 % ointment, as needed, Disp: , Rfl:       Allergies   Allergen Reactions    Penicillins      Past Medical History:   Diagnosis Date    Hypercholesteremia     Hypertension     Seizures     Stroke      Past Surgical History:   Procedure Laterality Date    HAND, CLOSED REDUCTION  01/29/2019    right wrist     Family History   Problem Relation Age of Onset    Breast cancer Sister      Social History     Socioeconomic History    Marital status: Widowed   Tobacco Use    Smoking status: Never    Smokeless tobacco: Never   Vaping Use    Vaping Use: Never used   Substance and Sexual Activity    Alcohol use: Not Currently    Drug use: Never    Sexual activity: Not Currently     PHYSICAL EXAM:  Vitals:    04/13/22 0923   BP: 167/69   Pulse: 76   Temp: 97 F (36.1 C)   SpO2: 100%     Note.General appearance: NAD, conversant.A total body skin examination is completed including palpation of scalp and inspection of hair of scalp, eyebrows, face, chest and extremities and inspection of eccrine and apocrine glands. The following anatomic skin sites are examined with findings recorded:  Head including face - diffuse hyperpigmentation of forehead  Neck - no lesions of concern  Chest including breasts, and axillae - no lesions of concern  Abdomen - on right abdomen, there are few hyperpigmented patches  Genitalia, groin, buttocks - no lesions of concern  Back - no lesions of concern  Right upper extremity - single hyperpigmented patch on right forearm  Left upper extremity - no lesions of  concern  Right lower extremity -  multiple hyperpigmented indurated plaques on right shin, distal posterior calf and ankle, largest ~4-5cm on anterior mid shin, most 2-3cm on ankle and distal posterior calf. Anterior mid shin and anterior/medial ankle plaques with superficial pink erosion and one area of yellow-brown crust; scattered hyperpigmented patches on right  thigh  Left lower extremity - scattered hyperpigmented patches on upper and lower leg     Examination of lymph node basins is completed, including the H and N, axilla and groin. There are two 3cm and 2cm firm masses in the right inguinal nodal basin     Pathology reports     07/14/18        Procedure Date & Time: 11/03/2021, 09:00                              FLOW CYTOMETRY REPORT          FLOW INTERPRETATION:          PERIPHERAL BLOOD: NO IMMUNOPHENOTYPIC ABNORMALITIES IDENTIFIED          COMMENT:     A population (12%) of lymphocytes is detected. About 53% of these cells     are CD3+ T cells and 30% are NK cells. Expressed as a percentage of     total T cells, CD4+/CD7-T cells are 4.0%, and CD4+/CD26-T cells are     3.9%. The calculated CD4/CD8 ratio is about 5.7. 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 Correlation with     morphology, clinical findings and other laboratory studies is     recommended for a complete evaluation.        PET CT WHOLE BODY     HISTORY: Mycosis fungoides, primarily cutaneous lymphoma (diagnosed in  2019). Post imiquimod, which has been held since 10/01/2021. Restaging.     TECHNIQUE:      Dose: 9.99  mCi F-18 fluorodeoxyglucose (F-18 FDG)  Serum Glucose: 104 mg/dL  Time from G-95 FDG injection to scan: 62 min     The patient fasted prior to this examination for greater than six hours.  Positron emission tomography (PET) images were obtained from the convexity  of the calvarium through the feet. As part of this examination, a  nondiagnostic CT scan without intravenous contrast was obtained over  the  same region utilizing a low tube current technique for anatomical  correlation and attenuation correction. Oral contrast was provided. Images  were reconstructed in the axial, sagittal and coronal planes.     CORRELATION: None available.     FINDINGS:      Mediastinum: SUV max 2.4  Liver: SUV max 3.2     HEAD AND NECK: Normal FDG distribution within the nasopharynx, oropharynx,  larynx and small sized thyroid gland. Limited evaluation of the brain  parenchyma demonstrates no discrete focus of increased FDG uptake. No FDG  avid cervical or supraclavicular lymph nodes. Minimal leftward deviation of  the nasal septum.     THORAX: No hypermetabolic foci within the lung parenchyma. Mildly FDG avid  subcentimeter bilateral axillary lymph nodes, likely inflammatory/reactive.  No other FDG avid thoracic lymph nodes. Coronary artery calcifications.  Aortic valvular calcifications. Small hiatal hernia. Bibasilar bandlike  linear atelectasis or scarring.     ABDOMEN AND PELVIS:   *  FDG avid right external iliac node (image 239): 0.4 x 0.7 cm with SUV  max 4.7  *  FDG avid right inguinal nodes, for example on image 243: up to 1.5 x  0.7 cm with SUV max up to 3.4     Mildly FDG avid subcentimeter left inguinal lymph nodes, likely  reactive/inflammatory.     Normal FDG distribution within the liver, spleen, mild atrophic pancreas,  and adrenal  glands. Cholelithiasis. Non-FDG avid exophytic right renal  cyst, possibly containing a septation, measuring 6.0 x 4.9 cm (image 190).  Colonic diverticula, especially within the sigmoid colon, without evidence  of diverticulitis. Multiple pelvic phleboliths.     Below the knee misregistration somewhat limits evaluation.  MUSCULOSKELETAL/EXTREMITIES:   No hypermetabolic foci within the bone marrow. Multilevel spinal  degenerative changes. No aggressive lytic or sclerotic lesion. Right total  hip arthroplasty, with associated surrounding increased uptake, likely  inflammatory. Moderate  right and mild left leg subcutaneous edema with mild  increased uptake, possibly inflammatory.     CUTANEOUS/SUBCUTANEOUS:   Tiny cutaneous foci of uptake in the lateral aspect of the right hemipelvis  (images 213-222), for example, on image 220: up to 0.2 cm in thickness with  SUV max up to 3.3     FDG avid subcutaneous nodules, with reference examples as follows:  *  Right inguinal/proximal right thigh subcutaneous soft tissue nodule  (image 268): 1.9 x 2.2 cm with SUV max 6.6  *  Proximal right thigh anteromedial subcutaneous soft tissue nodule  (image 279): 2.7 x 3.6 cm with SUV max 14.7     Multiple cutaneous foci of uptake in the right lower extremity, below the  knee, with reference examples as follows:  *  Curvilinear cutaneous uptake along the anterolateral mid right leg  (image 427): 0.5 cm in thickness with SUV max 5.1  *  Cutaneous uptake within the anteromedial aspect of the mid right leg  (image 431): 0.2 cm in thickness with SUV max 5.2  *  Cutaneous uptake along the medial and posteromedial right leg (images  449-477): up to 0.4 cm in thickness with SUV max up to 7.7     Cutaneous/subcutaneous foci of uptake within the left lower extremity, as  follows:  *  Cutaneous focus of uptake within the posterior distal left thigh  (image 349): 0.3 cm in thickness with SUV max 3.5  *  At least 3 foci of cutaneous uptake lateral to the left knee (image  [phone]), for example on image 372: up to 0.4 cm in thickness with SUV max  up to 9.3  *  Cutaneous/subcutaneous focus of uptake within the mid left leg (image  425): 0.5 x 0.3 cm with SUV max 4.7     IMPRESSION:      1. FDG avid cutaneous foci of uptake within the bilateral lower  extremities, particularly at and below the knees, is consistent with  malignancy.  2. FDG avid subcutaneous nodules within the right inguinal/right proximal  thigh region is consistent with malignancy.  3. FDG avid right inguinal and right external iliac lymph nodes are  suspicious  for nodal metastatic disease.    Assessment & Plan:   Pt is a [age]ST65 female with Stage 2B CTCL (T3, N1, M0, B0)  MF subtype    Stage 2B CTCL, MF subtype  - Morphologic changes and LAN emerged February 2023, now s/p Brentuximab x3 infusions (last 02/09/22), still receiving nbUVB 2x/week  - Biopsy shows CD30+ tumors  - PET CT Feb 2023 shows diffuse skin lesions and dermatopathic vs. Early malignancy in skin draining inguinal nodes  - Flow, TCR, CBC Feb 2023 without evidence of blood tumor burden  - Right groin with bulky subcutaneous masses c/w nodal disease  - We had a lengthy discussion of the patient's disease and therapy in the setting of being 88 years old and relatively frail post-CVA  - Discussed Brentuximab may be used again  in the future  also internally discussed possible doxorubicin  - The patient is very clear that she does not want additional therapy currently   - Will postpone additional therapy for 1 month   - Continue skin directed NBUVB but reduce to q week  - Multi-D follow up in 4-6 weeks    2. Superficial erosion of plaques on right shin  - Previous open wounds now improved on right posterior calf with wound care, now with new superficial erosions on right anterior shin tumors  - Continue alcohol, gentamicin, medihoney, and wound care. Small area of crusting suggestive of impetiginization should be treated by the gentamicin. Encouraged patient's daughter to alert [place] if this worsens.      Ferne Reus, MD  Director, Cutaneous Lymphoma and   High Risk/Transplant Dermatology   [facility]  [facility]  [address]  T [phone]  F [phone]  [email or web address]

## 2022-04-13 ENCOUNTER — Ambulatory Visit: Payer: Medicare Other | Attending: Dermatology | Admitting: Dermatology

## 2022-04-13 ENCOUNTER — Ambulatory Visit (INDEPENDENT_AMBULATORY_CARE_PROVIDER_SITE_OTHER): Payer: Medicare Other | Admitting: Student in an Organized Health Care Education/Training Program

## 2022-04-13 VITALS — BP 167/69 | HR 76 | Temp 97.0°F | Wt 112.7 lb

## 2022-04-13 DIAGNOSIS — B999 Unspecified infectious disease: Secondary | ICD-10-CM

## 2022-04-13 DIAGNOSIS — C84A0 Cutaneous T-cell lymphoma, unspecified, unspecified site: Secondary | ICD-10-CM | POA: Insufficient documentation

## 2022-04-13 DIAGNOSIS — C8400 Mycosis fungoides, unspecified site: Secondary | ICD-10-CM

## 2022-04-13 DIAGNOSIS — L988 Other specified disorders of the skin and subcutaneous tissue: Secondary | ICD-10-CM | POA: Insufficient documentation

## 2022-04-13 DIAGNOSIS — C8599 Non-Hodgkin lymphoma, unspecified, extranodal and solid organ sites: Secondary | ICD-10-CM

## 2022-04-13 DIAGNOSIS — R54 Age-related physical debility: Secondary | ICD-10-CM | POA: Insufficient documentation

## 2022-04-13 DIAGNOSIS — C8405 Mycosis fungoides, lymph nodes of inguinal region and lower limb: Secondary | ICD-10-CM

## 2022-04-13 NOTE — Progress Notes (Signed)
[facility]       Date Time: April 13, 2022   Patient Name: [name] Wall,[name] Wall   Referring Provider: Referring, Not On File,*    Primary Dx: Mycosis Fungoides, stage 2B      ONCOLOGY CARE TEAM  Dermatology: Russella Dar, MD  Derm Oncology: Ferne Reus, MD  Radiation Oncology:  Quentin Mulling, MD      HISTORY OF PRESENT ILLNESS  [name] Wall is 88 y.o. female h/o HTN, HLD, CVA with chronic R-sided weakness, and Mycosis Fungoides since 2019. She was previously treated with NBUVB.  She established care here at [facility] in November 2022 when she relocated to [facility] for MF. She developed tumor stage MF in Jan 2023 despite NVUBV, therefore, consensus recommendation was to treat with brentuximab.     She's here follow s/p brentuximab C3, in the company of her daughter. She has experienced increased fatigue and anorexia at last visit which has been improving in her time off of treatment, but lost 9lbs overall since starting. Eating slowly improving. They did not pick up the Rx for olanzapine provided last visit. She also has had increased hair loss in this time.    She continues to tolerate NVUBV two times a week, 45 sec per session. About 1 week ago, daughter also notes that a couple of the larger plaques on the anterior shin (mid and distal) have superficially opened and become shallow wounds. There is no pain or pruritus associated. Patient was previously attending wound care clinic for open wounds on her posterior right calf, which are healed and doing better. Currently using alcohol wipe, gentamicin, and medihoney on the wounds. Unfortunately, she has new additional lesions on the right leg.     She has chronic left side weakness from CVA in 2022, and R hand/wrist weakness s/p broken wrist, largely wheelchair bound. Denies any peripheral neuropathy such as numbness or tingling.     No history of other skin cancers. Negative family history of skin cancer and lymphoma.      MEDICATIONS:    Current Outpatient Medications:     ALPRAZolam (XANAX) 0.25 MG tablet, Take 1 tablet (0.25 mg) by mouth daily as needed for Anxiety, Disp: 30 tablet, Rfl: 0    amLODIPine (NORVASC) 10 MG tablet, Take 1 tablet (10 mg) by mouth daily, Disp: , Rfl:     aspirin 81 MG EC tablet, Take 1 tablet (81 mg) by mouth, Disp: , Rfl:     atorvastatin (LIPITOR) 40 MG tablet, Take 1 tablet (40 mg) by mouth every 24 hours, Disp: , Rfl:     ferrous gluconate (FERGON) 324 MG tablet, Take 1 tablet (324 mg) by mouth every morning with breakfast, Disp: 90 tablet, Rfl: 3    gentamicin (GARAMYCIN) 0.1 % ointment, APPLY UP TO TWICE A DAY ON CRUSTED AREAS UNTIL RESOLVED, Disp: , Rfl:     hydrALAZINE (APRESOLINE) 50 MG tablet, Take 1 tablet (50 mg) by mouth 3 (three) times daily with meals, Disp: , Rfl:     hydrOXYzine (ATARAX) 10 MG tablet, Take 1 tablet (10 mg) by mouth every 8 (eight) hours as needed for Itching, Disp: 30 tablet, Rfl: 1    imiquimod (ALDARA) 5 % cream, Apply nightly three times a week (Monday, Wednesday, Friday) to behind left knee and right ankle, Disp: 96 each, Rfl: 2    latanoprost (XALATAN) 0.005 % ophthalmic solution, INSTILL 1 DROP INTO EACH EYE AT BEDTIME, Disp: , Rfl:     levETIRAcetam (  KEPPRA) 500 MG tablet, Take 1 tablet (500 mg) by mouth 2 (two) times daily, Disp: , Rfl:     levothyroxine (SYNTHROID) 25 MCG tablet, Take 1 tablet (25 mcg) by mouth daily, Disp: , Rfl:     losartan (COZAAR) 50 MG tablet, Take 1 tablet (50 mg) by mouth daily, Disp: , Rfl:     pantoprazole (PROTONIX) 40 MG tablet, Take 1 tablet (40 mg) by mouth daily, Disp: 90 tablet, Rfl: 1    SSD 1 % cream, , Disp: , Rfl:     tacrolimus (PROTOPIC) 0.1 % ointment, Apply topically, Disp: , Rfl:     triamcinolone (KENALOG) 0.1 % cream, Apply topically, Disp: , Rfl:     triamcinolone (KENALOG) 0.1 % ointment, as needed, Disp: , Rfl:     doxycycline (VIBRAMYCIN) 100 MG capsule, Take 1 capsule (100 mg) by mouth 2 (two) times daily for  10 days (Patient not taking: Reported on 04/13/2022), Disp: 20 capsule, Rfl: 0    Allergies   Allergen Reactions    Penicillins      Past Medical History:   Diagnosis Date    Hypercholesteremia     Hypertension     Seizures     Stroke      Past Surgical History:   Procedure Laterality Date    HAND, CLOSED REDUCTION  01/29/2019    right wrist     Family History   Problem Relation Age of Onset    Breast cancer Sister      Social History     Socioeconomic History    Marital status: Widowed   Tobacco Use    Smoking status: Never    Smokeless tobacco: Never   Vaping Use    Vaping Use: Never used   Substance and Sexual Activity    Alcohol use: Not Currently    Drug use: Never    Sexual activity: Not Currently     PHYSICAL EXAM:  Vitals:    04/13/22 0920   BP: 167/69   Pulse: 76   Temp: 97 F (36.1 C)   SpO2: 100%     Wt Readings from Last 3 Encounters:   04/13/22 51.1 kg (112 lb 11.2 oz)   04/13/22 51.1 kg (112 lb 11.2 oz)   03/09/22 50.3 kg (111 lb)     ECOG PS: 1-2  General: NAD, seated in wheelchair, very slow in standing up or walking  Eyes: Anicteric, normal conjunctiva  ENT: No sores in mouth  CV: RRR  RESP: CTA B  GI: NT/ND, no HSM  Neuro: AAO x 3, slow/stuttering gait, R > L weakness  MS: no swelling in arms or legs  SKIN:   - Head including face - diffuse hyperpigmentation of forehead, thinning hair with receding hair line  - Right lower extremity -  multiple hyperpigmented indurated plaques on right shin, distal posterior calf and ankle, largest ~4-5cm on anterior mid shin, most 2-3cm on ankle and distal posterior calf. Anterior mid shin and anterior/medial ankle plaques with superficial pink erosion and one area of yellow-brown crust; scattered hyperpigmented patches on right thigh  - Left lower extremity - scattered hyperpigmented patches on upper and lower leg  - Right upper extremity - single hyperpigmented patch on right forearm  - Abdomen - flat hyperpigmented patch on RLQ  LYMPH: +R subcutaneous 2-3cm  lesions x2 and inguinal adenopathy; negative cervical suplarclavicular axillary     04/13/22            02/09/22  01/19/22            12/29/21  No significant interval change in MF  No sign of infection in right posterior ankle open wound    12/14/21              LABS:  Lab Results   Component Value Date    WBC 4.47 02/09/2022    HGB 11.5 02/09/2022    HCT 35.1 02/09/2022    MCV 78.5 02/09/2022    PLT 358 (H) 02/09/2022     Lab Results   Component Value Date    CREAT 0.8 02/09/2022    BUN 9.0 02/09/2022    NA 137 02/09/2022    K 3.1 (L) 02/09/2022    CL 100 02/09/2022    CO2 28 02/09/2022     Lab Results   Component Value Date    ALT 18 02/09/2022    AST 34 02/09/2022    ALKPHOS 85 02/09/2022    BILITOTAL 0.6 02/09/2022         IMAGING:  PET CT WHOLE BODY 11/19/2021  FINDINGS:   Mediastinum: SUV max 2.4  Liver: SUV max 3.2     HEAD AND NECK: Normal FDG distribution within the nasopharynx, oropharynx,  larynx and small sized thyroid gland. Limited evaluation of the brain  parenchyma demonstrates no discrete focus of increased FDG uptake. No FDG  avid cervical or supraclavicular lymph nodes. Minimal leftward deviation of  the nasal septum.     THORAX: No hypermetabolic foci within the lung parenchyma. Mildly FDG avid  subcentimeter bilateral axillary lymph nodes, likely inflammatory/reactive.  No other FDG avid thoracic lymph nodes. Coronary artery calcifications.  Aortic valvular calcifications. Small hiatal hernia. Bibasilar bandlike  linear atelectasis or scarring.     ABDOMEN AND PELVIS:   *  FDG avid right external iliac node (image 239): 0.4 x 0.7 cm with SUV  max 4.7  *  FDG avid right inguinal nodes, for example on image 243: up to 1.5 x  0.7 cm with SUV max up to 3.4     Mildly FDG avid subcentimeter left inguinal lymph nodes, likely  reactive/inflammatory.     Normal FDG distribution within the liver, spleen, mild atrophic pancreas,  and adrenal glands. Cholelithiasis. Non-FDG avid exophytic right  renal  cyst, possibly containing a septation, measuring 6.0 x 4.9 cm (image 190).  Colonic diverticula, especially within the sigmoid colon, without evidence  of diverticulitis. Multiple pelvic phleboliths.     Below the knee misregistration somewhat limits evaluation.    MUSCULOSKELETAL/EXTREMITIES:   No hypermetabolic foci within the bone marrow. Multilevel spinal  degenerative changes. No aggressive lytic or sclerotic lesion. Right total  hip arthroplasty, with associated surrounding increased uptake, likely  inflammatory. Moderate right and mild left leg subcutaneous edema with mild  increased uptake, possibly inflammatory.     CUTANEOUS/SUBCUTANEOUS:   Tiny cutaneous foci of uptake in the lateral aspect of the right hemipelvis  (images 213-222), for example, on image 220: up to 0.2 cm in thickness with  SUV max up to 3.3     FDG avid subcutaneous nodules, with reference examples as follows:  *  Right inguinal/proximal right thigh subcutaneous soft tissue nodule  (image 268): 1.9 x 2.2 cm with SUV max 6.6  *  Proximal right thigh anteromedial subcutaneous soft tissue nodule  (image 279): 2.7 x 3.6 cm with SUV max 14.7     Multiple cutaneous foci of uptake in the right lower extremity, below the  knee, with reference examples as follows:  *  Curvilinear cutaneous uptake along the anterolateral mid right leg  (image 427): 0.5 cm in thickness with SUV max 5.1  *  Cutaneous uptake within the anteromedial aspect of the mid right leg  (image 431): 0.2 cm in thickness with SUV max 5.2  *  Cutaneous uptake along the medial and posteromedial right leg (images  449-477): up to 0.4 cm in thickness with SUV max up to 7.7     Cutaneous/subcutaneous foci of uptake within the left lower extremity, as  follows:  *  Cutaneous focus of uptake within the posterior distal left thigh  (image 349): 0.3 cm in thickness with SUV max 3.5  *  At least 3 foci of cutaneous uptake lateral to the left knee (image  [phone]), for example on  image 372: up to 0.4 cm in thickness with SUV max  up to 9.3  *  Cutaneous/subcutaneous focus of uptake within the mid left leg (image  425): 0.5 x 0.3 cm with SUV max 4.7     IMPRESSION:   1. FDG avid cutaneous foci of uptake within the bilateral lower  extremities, particularly at and below the knees, is consistent with  malignancy.  2. FDG avid subcutaneous nodules within the right inguinal/right proximal  thigh region is consistent with malignancy.  3. FDG avid right inguinal and right external iliac lymph nodes are  suspicious for nodal metastatic disease.    PATHOLOGY    07/14/18            Procedure Date & Time: 11/03/2021, 09:00                              FLOW CYTOMETRY REPORT          FLOW INTERPRETATION:          PERIPHERAL BLOOD: NO IMMUNOPHENOTYPIC ABNORMALITIES IDENTIFIED          COMMENT:     A population (12%) of lymphocytes is detected. About 53% of these cells     are CD3+ T cells and 30% are NK cells. Expressed as a percentage of     total T cells, CD4+/CD7-T cells are 4.0%, and CD4+/CD26-T cells are     3.9%. The calculated CD4/CD8 ratio is about 5.7. 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 Correlation with     morphology, clinical findings and other laboratory studies is     recommended for a complete evaluation.       Assessment & Plan:   [name] Wall is 88 y.o. FST5 female h/o HTN, HLD, CVA with chronic R-sided weakness, and Mycosis Fungoides since 2019. She recently developed new nodules and lymphadenopathy with biopsy proven advancement to tumor stage disease, Stage 2B CTCL, MF subtype. Given scattered CD30+ disease, we recommend patient to receive brentuximab. We reviewed the dose (1.8mg /kg, with max 180mg ), schedule (every 3 weeks), route (IV), potential benefits and side effects (including but not limited to neutropenia, neuropathy). Given her advanced age and comorbidities, we will plan for a very limited series of treatments (1-3 doses). S/P 3 cycles  with mixed response to date, now with clinically progressive plaques on the RLE.     After a long discussion about the pros/cons, particularly her difficult experience with recent chemotherapy (anorexia, nausea, weight loss, fatigue) and advanced age, will plan to clinically monitor and potentially treat in the future if  continued progression of disease.    Stage 2B CTCL, MF subtype  - PET CT shows diffuse skin lesions and sub cutaneous masses near skin draining R inguinal nodes  - Flow, TCR, CBC without evidence of blood tumor burden  - Systemic Treatment: s/p Brentuximab 1.8mg /kg IV q3wks x3 doses    -- c/b fatigue, nausea, anorexia, weight loss, limiting further treatment    -- will consider re-treatment in the future with BV vs Doxil if disease progression  - Continue concurrent skin-directed therapy (NBUVB) with primary derm Dr. Chandra Batch    -- advised to reduce frequency to once/weekly  - Dual visit with Dr. Bryson Ha for TBSE in 4-6wks    2. Superficial erosion of plaques on right shin  - Previous open wounds now improved on right posterior calf with wound care, now with new superficial erosions on right anterior shin tumors  - Continue alcohol, gentamicin, medihoney, and wound care. Small area of crusting suggestive of impetiginization should be treated by the gentamicin. Encouraged patient's daughter to alert [place] if this worsens    3. Anorexia, weight loss  - Likely 2/2 brentuximab and advanced age, improving since time off treatment  - Rx for olanzapine 2.5mg  PO QHS for appetite stimulation, never picked up d/t concerns about psychiatric effects  - Discussed use of medications with multiple indications, consider use if pursuing re-treatment    RTC in 4-6wks        Abdulloh Ullom Al-Mondhiry, MD, MA  Medical Oncologist, Cutaneous Malignancies Program  Assistant Professor of Medical Education, [employer]  [facility]  [id number]  [place], [place] [address]  Phone:  [phone]  Fax: [phone]

## 2022-04-27 ENCOUNTER — Ambulatory Visit: Payer: Medicare Other | Admitting: Student in an Organized Health Care Education/Training Program

## 2022-04-27 ENCOUNTER — Ambulatory Visit: Payer: Medicare Other | Admitting: Dermatology

## 2022-05-24 ENCOUNTER — Telehealth: Payer: Self-pay | Admitting: Student in an Organized Health Care Education/Training Program

## 2022-05-24 ENCOUNTER — Telehealth: Payer: Self-pay | Admitting: Dermatology

## 2022-05-24 NOTE — Telephone Encounter (Signed)
Left voice message to confirm appointment for 05/24/2022: Advised patient to arrive 15-20 mins prior to appointment. Advised patient of visitor policy and mask requirement.

## 2022-05-24 NOTE — Telephone Encounter (Signed)
Left voice message to confirm appointment for 05/24/2022: Advised patient to arrive 15-20 mins prior to appointment. Advised patient of visitor policy and mask requirement.

## 2022-05-25 ENCOUNTER — Encounter: Payer: Self-pay | Admitting: Dermatology

## 2022-05-25 ENCOUNTER — Ambulatory Visit (INDEPENDENT_AMBULATORY_CARE_PROVIDER_SITE_OTHER): Payer: Medicare Other | Admitting: Student in an Organized Health Care Education/Training Program

## 2022-05-25 ENCOUNTER — Encounter: Payer: Self-pay | Admitting: Student in an Organized Health Care Education/Training Program

## 2022-05-25 ENCOUNTER — Ambulatory Visit: Payer: Medicare Other | Attending: Dermatology | Admitting: Dermatology

## 2022-05-25 VITALS — BP 163/66 | HR 83 | Temp 96.9°F | Ht 60.0 in | Wt 109.7 lb

## 2022-05-25 DIAGNOSIS — Z8639 Personal history of other endocrine, nutritional and metabolic disease: Secondary | ICD-10-CM | POA: Insufficient documentation

## 2022-05-25 DIAGNOSIS — Z8679 Personal history of other diseases of the circulatory system: Secondary | ICD-10-CM | POA: Insufficient documentation

## 2022-05-25 DIAGNOSIS — S81801A Unspecified open wound, right lower leg, initial encounter: Secondary | ICD-10-CM

## 2022-05-25 DIAGNOSIS — X58XXXS Exposure to other specified factors, sequela: Secondary | ICD-10-CM | POA: Insufficient documentation

## 2022-05-25 DIAGNOSIS — Z8673 Personal history of transient ischemic attack (TIA), and cerebral infarction without residual deficits: Secondary | ICD-10-CM | POA: Insufficient documentation

## 2022-05-25 DIAGNOSIS — C8405 Mycosis fungoides, lymph nodes of inguinal region and lower limb: Secondary | ICD-10-CM

## 2022-05-25 DIAGNOSIS — S81801S Unspecified open wound, right lower leg, sequela: Secondary | ICD-10-CM | POA: Insufficient documentation

## 2022-05-25 DIAGNOSIS — R630 Anorexia: Secondary | ICD-10-CM | POA: Insufficient documentation

## 2022-05-25 MED ORDER — MIRTAZAPINE 7.5 MG PO TABS
7.5000 mg | ORAL_TABLET | Freq: Every evening | ORAL | 11 refills | Status: DC
Start: 2022-05-25 — End: 2022-09-09

## 2022-05-25 NOTE — Progress Notes (Signed)
[facility]       Date Time: May 25, 2022   Patient Name: [name] Wall,[name] Wall   Referring Provider: Referring, Not On File,*    Primary Dx: Mycosis Fungoides, stage 2B      ONCOLOGY CARE TEAM  Dermatology: Russella Dar, MD  Derm Oncology: Ferne Reus, MD  Radiation Oncology:  Quentin Mulling, MD      HISTORY OF PRESENT ILLNESS  [name] Wall is 88 y.o. female h/o HTN, HLD, CVA with chronic R-sided weakness, and Mycosis Fungoides since 2019. She was previously treated with NBUVB.  She established care here at [facility] in November 2022 when she relocated to [facility] for MF. She developed tumor stage MF in Jan 2023 despite NVUBV, therefore, consensus recommendation was to treat with brentuximab.     She's here follow s/p brentuximab C3, in the company of her daughter. She has experienced increased fatigue, fevers, chills and anorexia, went to an ED in [place] about 10d ago, diagnosed with COVID (along with her daughter). No SOB or coughing. Not given paxlovid due to medication interactions. Eating slowly improving but overall poor, with associated weight.     She continues to tolerate NVUBV once a week, 15 sec per session. She notes that a couple of the larger plaques on the anterior shin (mid and distal) have continued to grow, still with shallow wounds around R ankle. Lymphadenopathy worsening. There is no pain or pruritus associated.    She has chronic left side weakness from CVA in 2022, and R hand/wrist weakness s/p broken wrist, largely wheelchair bound. Denies any peripheral neuropathy such as numbness or tingling. She is frustrated by her lack of mobility.    No history of other skin cancers. Negative family history of skin cancer and lymphoma.     MEDICATIONS:    Current Outpatient Medications:     ALPRAZolam (XANAX) 0.25 MG tablet, Take 1 tablet (0.25 mg) by mouth daily as needed for Anxiety, Disp: 30 tablet, Rfl: 0    amLODIPine (NORVASC) 10 MG tablet, Take 1 tablet  (10 mg) by mouth daily, Disp: , Rfl:     aspirin 81 MG EC tablet, Take 1 tablet (81 mg) by mouth, Disp: , Rfl:     atorvastatin (LIPITOR) 40 MG tablet, Take 1 tablet (40 mg) by mouth every 24 hours, Disp: , Rfl:     ferrous gluconate (FERGON) 324 MG tablet, Take 1 tablet (324 mg) by mouth every morning with breakfast, Disp: 90 tablet, Rfl: 3    gentamicin (GARAMYCIN) 0.1 % ointment, APPLY UP TO TWICE A DAY ON CRUSTED AREAS UNTIL RESOLVED, Disp: , Rfl:     hydrALAZINE (APRESOLINE) 50 MG tablet, Take 1 tablet (50 mg) by mouth 3 (three) times daily with meals, Disp: , Rfl:     hydrOXYzine (ATARAX) 10 MG tablet, Take 1 tablet (10 mg) by mouth every 8 (eight) hours as needed for Itching, Disp: 30 tablet, Rfl: 1    imiquimod (ALDARA) 5 % cream, Apply nightly three times a week (Monday, Wednesday, Friday) to behind left knee and right ankle, Disp: 96 each, Rfl: 2    latanoprost (XALATAN) 0.005 % ophthalmic solution, INSTILL 1 DROP INTO EACH EYE AT BEDTIME, Disp: , Rfl:     levETIRAcetam (KEPPRA) 500 MG tablet, Take 1 tablet (500 mg) by mouth 2 (two) times daily, Disp: , Rfl:     levothyroxine (SYNTHROID) 25 MCG tablet, Take 1 tablet (25 mcg) by mouth daily, Disp: , Rfl:  losartan (COZAAR) 50 MG tablet, Take 1 tablet (50 mg) by mouth daily, Disp: , Rfl:     pantoprazole (PROTONIX) 40 MG tablet, Take 1 tablet (40 mg) by mouth daily, Disp: 90 tablet, Rfl: 1    SSD 1 % cream, , Disp: , Rfl:     tacrolimus (PROTOPIC) 0.1 % ointment, Apply topically, Disp: , Rfl:     triamcinolone (KENALOG) 0.1 % cream, Apply topically, Disp: , Rfl:     triamcinolone (KENALOG) 0.1 % ointment, as needed, Disp: , Rfl:     Allergies   Allergen Reactions    Penicillins      Past Medical History:   Diagnosis Date    Hypercholesteremia     Hypertension     Seizures     Stroke      Past Surgical History:   Procedure Laterality Date    HAND, CLOSED REDUCTION  01/29/2019    right wrist     Family History   Problem Relation Age of Onset    Breast  cancer Sister      Social History     Socioeconomic History    Marital status: Widowed   Tobacco Use    Smoking status: Never    Smokeless tobacco: Never   Vaping Use    Vaping Use: Never used   Substance and Sexual Activity    Alcohol use: Not Currently    Drug use: Never    Sexual activity: Not Currently     PHYSICAL EXAM:  Vitals:    05/25/22 0909   BP: 163/66   Pulse: 83   Temp: (!) 96.9 F (36.1 C)   SpO2: 100%     Wt Readings from Last 3 Encounters:   05/25/22 49.8 kg (109 lb 11.2 oz)   04/13/22 51.1 kg (112 lb 11.2 oz)   04/13/22 51.1 kg (112 lb 11.2 oz)     ECOG PS: 1-2  General: NAD, seated in wheelchair, very slow in standing up or walking  Eyes: Anicteric, normal conjunctiva  ENT: No sores in mouth  CV: RRR  RESP: CTA B  GI: NT/ND, no HSM  Neuro: AAO x 3, slow/stuttering gait, R > L weakness  MS: no swelling in arms or legs  SKIN:   - Head including face - diffuse hyperpigmentation of forehead, thinning hair with receding hair line  - Right lower extremity -  multiple hyperpigmented indurated plaques on right shin, distal posterior calf and ankle, largest ~4-5cm on anterior mid shin, most 2-3cm on ankle and distal posterior calf (WORSENING, now tumor stage). Anterior mid shin and anterior/medial ankle plaques with superficial pink erosion; scattered hyperpigmented patches on right thigh  - Left lower extremity - scattered hyperpigmented patches on upper and lower leg  - Right upper extremity - single hyperpigmented patch on right forearm  - Abdomen - flat hyperpigmented patch on RLQ  LYMPH: +multiple R subcutaneous 4-6cm lymph node conglomerates and inguinal adenopathy (WORSENING); negative cervical suplarclavicular axillary     04/13/22            02/09/22        01/19/22            12/29/21  No significant interval change in MF  No sign of infection in right posterior ankle open wound    12/14/21              LABS:  Lab Results   Component Value Date    WBC 4.47 02/09/2022  HGB 11.5 02/09/2022    HCT 35.1  02/09/2022    MCV 78.5 02/09/2022    PLT 358 (H) 02/09/2022     Lab Results   Component Value Date    CREAT 0.8 02/09/2022    BUN 9.0 02/09/2022    NA 137 02/09/2022    K 3.1 (L) 02/09/2022    CL 100 02/09/2022    CO2 28 02/09/2022     Lab Results   Component Value Date    ALT 18 02/09/2022    AST 34 02/09/2022    ALKPHOS 85 02/09/2022    BILITOTAL 0.6 02/09/2022         IMAGING:  PET CT WHOLE BODY 11/19/2021  FINDINGS:   Mediastinum: SUV max 2.4  Liver: SUV max 3.2     HEAD AND NECK: Normal FDG distribution within the nasopharynx, oropharynx,  larynx and small sized thyroid gland. Limited evaluation of the brain  parenchyma demonstrates no discrete focus of increased FDG uptake. No FDG  avid cervical or supraclavicular lymph nodes. Minimal leftward deviation of  the nasal septum.     THORAX: No hypermetabolic foci within the lung parenchyma. Mildly FDG avid  subcentimeter bilateral axillary lymph nodes, likely inflammatory/reactive.  No other FDG avid thoracic lymph nodes. Coronary artery calcifications.  Aortic valvular calcifications. Small hiatal hernia. Bibasilar bandlike  linear atelectasis or scarring.     ABDOMEN AND PELVIS:   *  FDG avid right external iliac node (image 239): 0.4 x 0.7 cm with SUV  max 4.7  *  FDG avid right inguinal nodes, for example on image 243: up to 1.5 x  0.7 cm with SUV max up to 3.4     Mildly FDG avid subcentimeter left inguinal lymph nodes, likely  reactive/inflammatory.     Normal FDG distribution within the liver, spleen, mild atrophic pancreas,  and adrenal glands. Cholelithiasis. Non-FDG avid exophytic right renal  cyst, possibly containing a septation, measuring 6.0 x 4.9 cm (image 190).  Colonic diverticula, especially within the sigmoid colon, without evidence  of diverticulitis. Multiple pelvic phleboliths.     Below the knee misregistration somewhat limits evaluation.    MUSCULOSKELETAL/EXTREMITIES:   No hypermetabolic foci within the bone marrow. Multilevel  spinal  degenerative changes. No aggressive lytic or sclerotic lesion. Right total  hip arthroplasty, with associated surrounding increased uptake, likely  inflammatory. Moderate right and mild left leg subcutaneous edema with mild  increased uptake, possibly inflammatory.     CUTANEOUS/SUBCUTANEOUS:   Tiny cutaneous foci of uptake in the lateral aspect of the right hemipelvis  (images 213-222), for example, on image 220: up to 0.2 cm in thickness with  SUV max up to 3.3     FDG avid subcutaneous nodules, with reference examples as follows:  *  Right inguinal/proximal right thigh subcutaneous soft tissue nodule  (image 268): 1.9 x 2.2 cm with SUV max 6.6  *  Proximal right thigh anteromedial subcutaneous soft tissue nodule  (image 279): 2.7 x 3.6 cm with SUV max 14.7     Multiple cutaneous foci of uptake in the right lower extremity, below the  knee, with reference examples as follows:  *  Curvilinear cutaneous uptake along the anterolateral mid right leg  (image 427): 0.5 cm in thickness with SUV max 5.1  *  Cutaneous uptake within the anteromedial aspect of the mid right leg  (image 431): 0.2 cm in thickness with SUV max 5.2  *  Cutaneous uptake along the medial and posteromedial  right leg (images  449-477): up to 0.4 cm in thickness with SUV max up to 7.7     Cutaneous/subcutaneous foci of uptake within the left lower extremity, as  follows:  *  Cutaneous focus of uptake within the posterior distal left thigh  (image 349): 0.3 cm in thickness with SUV max 3.5  *  At least 3 foci of cutaneous uptake lateral to the left knee (image  [id number] [id number]), for example on image 372: up to 0.4 cm in thickness with SUV max  up to 9.3  *  Cutaneous/subcutaneous focus of uptake within the mid left leg (image  425): 0.5 x 0.3 cm with SUV max 4.7     IMPRESSION:   1. FDG avid cutaneous foci of uptake within the bilateral lower  extremities, particularly at and below the knees, is consistent with  malignancy.  2. FDG avid subcutaneous  nodules within the right inguinal/right proximal  thigh region is consistent with malignancy.  3. FDG avid right inguinal and right external iliac lymph nodes are  suspicious for nodal metastatic disease.    PATHOLOGY    07/14/18            Procedure Date & Time: 11/03/2021, 09:00                              FLOW CYTOMETRY REPORT          FLOW INTERPRETATION:          PERIPHERAL BLOOD: NO IMMUNOPHENOTYPIC ABNORMALITIES IDENTIFIED          COMMENT:     A population (12%) of lymphocytes is detected. About 53% of these cells     are CD3+ T cells and 30% are NK cells. Expressed as a percentage of     total T cells, CD4+/CD7-T cells are 4.0%, and CD4+/CD26-T cells are     3.9%. The calculated CD4/CD8 ratio is about 5.7. 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 Correlation with     morphology, clinical findings and other laboratory studies is     recommended for a complete evaluation.       Assessment & Plan:   [name] Wall is 88 y.o. FST5 female h/o HTN, HLD, CVA with chronic R-sided weakness, and Mycosis Fungoides since 2019. She recently developed new nodules and lymphadenopathy with biopsy proven advancement to tumor stage disease, Stage 2B CTCL, MF subtype. Given scattered CD30+ disease, we recommend patient to receive brentuximab. We reviewed the dose (1.8mg /kg, with max 180mg ), schedule (every 3 weeks), route (IV), potential benefits and side effects (including but not limited to neutropenia, neuropathy). Given her advanced age and comorbidities, we will plan for a very limited series of treatments (1-3 doses). S/P 3 cycles with mixed response to date, now with clinically progressive plaques and lymph node conglomerates on the RLE.     After another long discussion about the pros/cons, particularly her difficult experience with recent chemotherapy (anorexia, nausea, weight loss, fatigue) and advanced age, will plan to clinically monitor and potentially treat in the future if  continued progression of disease. We did discuss the possibility of infections, lymphedema, bleeding, or pain in the future with disease that is already clearly advancing, but we will need greater justification before going back into systemic treatment.     Stage 2B CTCL, MF subtype  - PET CT shows diffuse skin lesions and sub cutaneous masses near skin  draining R inguinal nodes  - Flow, TCR, CBC without evidence of blood tumor burden  - Systemic Treatment: s/p Brentuximab 1.8mg /kg IV q3wks x3 doses    -- c/b fatigue, nausea, anorexia, weight loss, limiting further treatment    -- will consider re-treatment in the future with BV vs Doxil if disease progression  - Continue concurrent skin-directed therapy (NBUVB) with primary derm Dr. Chandra Batch    -- advised to reduce frequency to once/weekly  - Dual visit with Dr. Bryson Ha for TBSE in 4-6wks    2. Superficial erosion of plaques on right shin  - Previous open wounds now improved on right posterior calf with wound care, now with new superficial erosions on right anterior shin tumors  - Continue alcohol, gentamicin, medihoney, and wound care. Small area of crusting suggestive of impetiginization should be treated by the gentamicin. Encouraged patient's daughter to alert [place] if this worsens    3. Anorexia, weight loss  - Likely 2/2 brentuximab and advanced age, improving since time off treatment  - Rx for olanzapine 2.5mg  PO QHS for appetite stimulation, never picked up d/t concerns about psychiatric effects  - Discussed use of medications with multiple indications, consider use if pursuing re-treatment    RTC in 6wks        Norm Wray Al-Mondhiry, MD, MA  Medical Oncologist, Cutaneous Malignancies Program  Assistant Professor of Medical Education, [employer]  [facility]  [id number]  [place], [place] [address]  Phone: [phone]  Fax: [phone]

## 2022-05-25 NOTE — Progress Notes (Signed)
HISTORY OF PRESENT ILLNESS    [name] Wall is 88 y.o. 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  She was previously treated with NBUVB.  I met her in November 2022 when she relocated to [facility] and noted Stage 1B, limited thin patch disease.  The patient is post-CVA and in her 90's making phototherapy challenging but doable.  I recommended topical imiquimod MWF.  She used this with a brisk inflammatory response.  She has held imiquimod since 10/01/21.  Using TAC 0.1% prn.  Receiving NBUVB 6+ minutes per session twice weekly.     In early January 2023, her daughter noticed 3 new lesions which are much more raised.  She presents earlier than scheduled for urgent examination.  The MD at [facility] (phototherapy site) performed 2 punch biopsies of the new thick plaques last week.  Diagnosis was tumor stage MF, no LCT, scattered CD 30+.  She developed an open wound at the site of a tumor on the distal right leg, which was painful. In Feb 2023, morphologic change to plaques/tumors on LEs and new inguinal LAN were noted. Flow and PET updated. Opted to pursue Brentuximab treatment.     Patient has had 3 infusions of Brentuximab (1.8 mg/kg IV q 3weeks, last 02/09/22). Patient experienced side effects including weight loss, appetite suppresion, and diarrhea/constipation, which are now improved over last few weeks. She has lost about ~9 lbs (120 > 111lbs) since starting treatment. Daughter did not start the medication suggested for appetite stimulation after reading up on it online but did add some calorie supplements that she thinks has been helpful. Over last week in particular, patient's appetite is much improved and back at baseline. About 1 week ago, daughter also notes that a couple of the larger plaques on the anterior shin (mid and distal) have superficially opened and become shallow wounds. There is no pain or pruritus associated. Patient was previously attending wound care clinic for open wounds on her  posterior right calf, which are healed and doing better. Currently using alcohol wipe, gentamicin, and medihoney on the wounds. Unfortunately, she has new additional lesions on the right leg. Still doing NBUVB 2x/week, have self-capped time due to concern that it was causing hair loss and the plaques on the right lower lower leg to open up and become wounds.     No history of other skin cancers. Negative family history of skin cancer and lymphoma.       Current Outpatient Medications:     ALPRAZolam (XANAX) 0.25 MG tablet, Take 1 tablet (0.25 mg) by mouth daily as needed for Anxiety, Disp: 30 tablet, Rfl: 0    amLODIPine (NORVASC) 10 MG tablet, Take 1 tablet (10 mg) by mouth daily, Disp: , Rfl:     aspirin 81 MG EC tablet, Take 1 tablet (81 mg) by mouth, Disp: , Rfl:     atorvastatin (LIPITOR) 40 MG tablet, Take 1 tablet (40 mg) by mouth every 24 hours, Disp: , Rfl:     ferrous gluconate (FERGON) 324 MG tablet, Take 1 tablet (324 mg) by mouth every morning with breakfast, Disp: 90 tablet, Rfl: 3    gentamicin (GARAMYCIN) 0.1 % ointment, APPLY UP TO TWICE A DAY ON CRUSTED AREAS UNTIL RESOLVED, Disp: , Rfl:     hydrALAZINE (APRESOLINE) 50 MG tablet, Take 1 tablet (50 mg) by mouth 3 (three) times daily with meals, Disp: , Rfl:     hydrOXYzine (ATARAX) 10 MG tablet, Take 1 tablet (10 mg) by mouth every  8 (eight) hours as needed for Itching, Disp: 30 tablet, Rfl: 1    imiquimod (ALDARA) 5 % cream, Apply nightly three times a week (Monday, Wednesday, Friday) to behind left knee and right ankle, Disp: 96 each, Rfl: 2    latanoprost (XALATAN) 0.005 % ophthalmic solution, INSTILL 1 DROP INTO EACH EYE AT BEDTIME, Disp: , Rfl:     levETIRAcetam (KEPPRA) 500 MG tablet, Take 1 tablet (500 mg) by mouth 2 (two) times daily, Disp: , Rfl:     levothyroxine (SYNTHROID) 25 MCG tablet, Take 1 tablet (25 mcg) by mouth daily, Disp: , Rfl:     losartan (COZAAR) 50 MG tablet, Take 1 tablet (50 mg) by mouth daily, Disp: , Rfl:      pantoprazole (PROTONIX) 40 MG tablet, Take 1 tablet (40 mg) by mouth daily, Disp: 90 tablet, Rfl: 1    SSD 1 % cream, , Disp: , Rfl:     tacrolimus (PROTOPIC) 0.1 % ointment, Apply topically, Disp: , Rfl:     triamcinolone (KENALOG) 0.1 % cream, Apply topically, Disp: , Rfl:     triamcinolone (KENALOG) 0.1 % ointment, as needed, Disp: , Rfl:     mirtazapine (REMERON) 7.5 MG tablet, Take 1 tablet (7.5 mg) by mouth nightly, Disp: 30 tablet, Rfl: 11      Allergies   Allergen Reactions    Penicillins      Past Medical History:   Diagnosis Date    Hypercholesteremia     Hypertension     Seizures     Stroke      Past Surgical History:   Procedure Laterality Date    HAND, CLOSED REDUCTION  01/29/2019    right wrist     Family History   Problem Relation Age of Onset    Breast cancer Sister      Social History     Socioeconomic History    Marital status: Widowed   Tobacco Use    Smoking status: Never    Smokeless tobacco: Never   Vaping Use    Vaping Use: Never used   Substance and Sexual Activity    Alcohol use: Not Currently    Drug use: Never    Sexual activity: Not Currently     PHYSICAL EXAM:  Vitals:    05/25/22 0919   BP: 163/66   Pulse: 83   Temp: (!) 96.9 F (36.1 C)   SpO2: 100%     Note.General appearance: NAD, conversant.A total body skin examination is completed including palpation of scalp and inspection of hair of scalp, eyebrows, face, chest and extremities and inspection of eccrine and apocrine glands. The following anatomic skin sites are examined with findings recorded:  Head including face - diffuse hyperpigmentation of forehead  Neck - no lesions of concern  Chest including breasts, and axillae - no lesions of concern  Abdomen - on right abdomen, there are few hyperpigmented patches  Genitalia, groin, buttocks - no lesions of concern  Back - no lesions of concern  Right upper extremity - single hyperpigmented patch on right forearm  Left upper extremity - no lesions of concern  Right lower extremity -   multiple hyperpigmented indurated plaques on right shin, distal posterior calf and ankle, largest ~4-5cm on anterior mid shin, most 2-3cm on ankle and distal posterior calf. Anterior mid shin and anterior/medial ankle plaques with superficial pink erosion and one area of yellow-brown crust; scattered hyperpigmented patches on right thigh  Left lower extremity - scattered hyperpigmented  patches on upper and lower leg     Examination of lymph node basins is completed, including the H and N, axilla and groin. There are  multiple 3cm and 2cm subcutaneous firm masses in the right inguinal nodal basin extending down the right thigh into popliteal fossa    Pathology reports     07/14/18        Procedure Date & Time: 11/03/2021, 09:00                              FLOW CYTOMETRY REPORT          FLOW INTERPRETATION:          PERIPHERAL BLOOD: NO IMMUNOPHENOTYPIC ABNORMALITIES IDENTIFIED          COMMENT:     A population (12%) of lymphocytes is detected. About 53% of these cells     are CD3+ T cells and 30% are NK cells. Expressed as a percentage of     total T cells, CD4+/CD7-T cells are 4.0%, and CD4+/CD26-T cells are     3.9%. The calculated CD4/CD8 ratio is about 5.7. 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 Correlation with     morphology, clinical findings and other laboratory studies is     recommended for a complete evaluation.        PET CT WHOLE BODY     HISTORY: Mycosis fungoides, primarily cutaneous lymphoma (diagnosed in  2019). Post imiquimod, which has been held since 10/01/2021. Restaging.     TECHNIQUE:      Dose: 9.99  mCi F-18 fluorodeoxyglucose (F-18 FDG)  Serum Glucose: 104 mg/dL  Time from U-98 FDG injection to scan: 62 min     The patient fasted prior to this examination for greater than six hours.  Positron emission tomography (PET) images were obtained from the convexity  of the calvarium through the feet. As part of this examination, a  nondiagnostic CT scan without  intravenous contrast was obtained over the  same region utilizing a low tube current technique for anatomical  correlation and attenuation correction. Oral contrast was provided. Images  were reconstructed in the axial, sagittal and coronal planes.     CORRELATION: None available.     FINDINGS:      Mediastinum: SUV max 2.4  Liver: SUV max 3.2     HEAD AND NECK: Normal FDG distribution within the nasopharynx, oropharynx,  larynx and small sized thyroid gland. Limited evaluation of the brain  parenchyma demonstrates no discrete focus of increased FDG uptake. No FDG  avid cervical or supraclavicular lymph nodes. Minimal leftward deviation of  the nasal septum.     THORAX: No hypermetabolic foci within the lung parenchyma. Mildly FDG avid  subcentimeter bilateral axillary lymph nodes, likely inflammatory/reactive.  No other FDG avid thoracic lymph nodes. Coronary artery calcifications.  Aortic valvular calcifications. Small hiatal hernia. Bibasilar bandlike  linear atelectasis or scarring.     ABDOMEN AND PELVIS:   *  FDG avid right external iliac node (image 239): 0.4 x 0.7 cm with SUV  max 4.7  *  FDG avid right inguinal nodes, for example on image 243: up to 1.5 x  0.7 cm with SUV max up to 3.4     Mildly FDG avid subcentimeter left inguinal lymph nodes, likely  reactive/inflammatory.     Normal FDG distribution within the liver, spleen, mild atrophic pancreas,  and  adrenal glands. Cholelithiasis. Non-FDG avid exophytic right renal  cyst, possibly containing a septation, measuring 6.0 x 4.9 cm (image 190).  Colonic diverticula, especially within the sigmoid colon, without evidence  of diverticulitis. Multiple pelvic phleboliths.     Below the knee misregistration somewhat limits evaluation.  MUSCULOSKELETAL/EXTREMITIES:   No hypermetabolic foci within the bone marrow. Multilevel spinal  degenerative changes. No aggressive lytic or sclerotic lesion. Right total  hip arthroplasty, with associated surrounding increased  uptake, likely  inflammatory. Moderate right and mild left leg subcutaneous edema with mild  increased uptake, possibly inflammatory.     CUTANEOUS/SUBCUTANEOUS:   Tiny cutaneous foci of uptake in the lateral aspect of the right hemipelvis  (images 213-222), for example, on image 220: up to 0.2 cm in thickness with  SUV max up to 3.3     FDG avid subcutaneous nodules, with reference examples as follows:  *  Right inguinal/proximal right thigh subcutaneous soft tissue nodule  (image 268): 1.9 x 2.2 cm with SUV max 6.6  *  Proximal right thigh anteromedial subcutaneous soft tissue nodule  (image 279): 2.7 x 3.6 cm with SUV max 14.7     Multiple cutaneous foci of uptake in the right lower extremity, below the  knee, with reference examples as follows:  *  Curvilinear cutaneous uptake along the anterolateral mid right leg  (image 427): 0.5 cm in thickness with SUV max 5.1  *  Cutaneous uptake within the anteromedial aspect of the mid right leg  (image 431): 0.2 cm in thickness with SUV max 5.2  *  Cutaneous uptake along the medial and posteromedial right leg (images  449-477): up to 0.4 cm in thickness with SUV max up to 7.7     Cutaneous/subcutaneous foci of uptake within the left lower extremity, as  follows:  *  Cutaneous focus of uptake within the posterior distal left thigh  (image 349): 0.3 cm in thickness with SUV max 3.5  *  At least 3 foci of cutaneous uptake lateral to the left knee (image  [phone]), for example on image 372: up to 0.4 cm in thickness with SUV max  up to 9.3  *  Cutaneous/subcutaneous focus of uptake within the mid left leg (image  425): 0.5 x 0.3 cm with SUV max 4.7     IMPRESSION:      1. FDG avid cutaneous foci of uptake within the bilateral lower  extremities, particularly at and below the knees, is consistent with  malignancy.  2. FDG avid subcutaneous nodules within the right inguinal/right proximal  thigh region is consistent with malignancy.  3. FDG avid right inguinal and right  external iliac lymph nodes are  suspicious for nodal metastatic disease.    Assessment & Plan:   Pt is a 28FST7 female with Stage 2B CTCL (T3, N1, M0, B0)  MF subtype    Stage 2B CTCL, MF subtype  - Morphologic changes and LAN emerged February 2023, now s/p Brentuximab x3 infusions (last 02/09/22), still receiving nbUVB 2x/week  - Biopsy shows CD30+ tumors  - PET CT Feb 2023 shows diffuse skin lesions and dermatopathic vs. Early malignancy in skin draining inguinal nodes  - Flow, TCR, CBC Feb 2023 without evidence of blood tumor burden  - Right groin with multiple bulky subcutaneous masses tracking down the nodal basin c/w nodal disease  - We had a lengthy discussion of the patient's disease and therapy in the setting of being 88 years old and relatively frail post-CVA  -  Discussed Brentuximab may be used again in the future  also internally discussed possible doxorubicin  - The patient is very clear that she does not want additional therapy currently   - Will postpone additional therapy for 16 weeks   - Continue skin directed NBUVB but reduce to q week  - Multi-D follow up in 4-6 weeks    2. Superficial erosion of plaques on right shin  - Previous open wounds now improved on right posterior calf with wound care, now with new superficial erosions on right anterior shin tumors  - stop alcohol wipe and use dial antibacterial soap, medihoney, and wound care. Any small area of crusting suggestive of impetiginization should be treated by the gentamicin. Encouraged patient's daughter to alert [place] if this worsens.    Nelva Nay, MSN, FNP-BC  Nurse Practitioner, [facility]  [facility]   [facility]   [address]  [place], [place], [address]  [phone]  [phone]       Ferne Reus, MD  Director, Cutaneous Lymphoma and   High Risk/Transplant Dermatology   [facility]  [facility]  [address]  T [phone]  F [phone]  [email or web address]

## 2022-06-07 ENCOUNTER — Telehealth: Payer: Self-pay

## 2022-06-07 NOTE — Telephone Encounter (Signed)
Patients daughter, [name], called. Patient was evaluated by Dr. Jillyn Hidden, [facility] MD, this AM. Dr. Jillyn Hidden was highly concerned for patients right shin wounds and advised she be seen by either of Dr. Jerral Bonito asap.   The patient saw Dr. Jillyn Hidden outside of typical [facility] d/t scheduling availability, apparently the patient/Gayle were told Dr. Jillyn Hidden could not treat her wounds d/t severity today but again suggested urgent evaluation by our team and wound care visit (in wound care center) on Wednesday 9/13.     [name] describes the wounds are "exploding" the patient is more fatigued/sleeping often, denies fever/chills, denies increase in pain (discomfort mainly in the night, relieved with Tylenol), denies crusting or pus drainage, drainage is mainly clear/bloodtinged, reports right foot swelling and numbness, denies acute mental status changes but patient is having difficulty walking and loss of appetite.   of note, the patient had covid 2-3 weeks ago and UTI when in hospital, currently not on any antibiotics.      wounds are covered now by Dr. Veronda Prude recent dressing change so [name] did not want to remove to send photo

## 2022-06-07 NOTE — Telephone Encounter (Signed)
Patient will see Dr. Dickey Gave tomorrow at 1130 AM accompanied by her daughter. Confirmed time and date of appointment with the patients daughter, [name]. Appointment placed on schedule by [employer].

## 2022-06-08 ENCOUNTER — Encounter: Payer: Self-pay | Admitting: Student in an Organized Health Care Education/Training Program

## 2022-06-08 ENCOUNTER — Encounter: Payer: Self-pay | Admitting: Dermatology

## 2022-06-08 ENCOUNTER — Ambulatory Visit
Payer: Medicare Other | Attending: Student in an Organized Health Care Education/Training Program | Admitting: Student in an Organized Health Care Education/Training Program

## 2022-06-08 ENCOUNTER — Other Ambulatory Visit: Payer: Self-pay

## 2022-06-08 ENCOUNTER — Ambulatory Visit (INDEPENDENT_AMBULATORY_CARE_PROVIDER_SITE_OTHER): Payer: Medicare Other | Admitting: Dermatology

## 2022-06-08 VITALS — BP 134/73 | HR 83 | Temp 97.0°F | Ht 60.0 in | Wt 106.4 lb

## 2022-06-08 DIAGNOSIS — C8405 Mycosis fungoides, lymph nodes of inguinal region and lower limb: Secondary | ICD-10-CM

## 2022-06-08 DIAGNOSIS — E43 Unspecified severe protein-calorie malnutrition: Secondary | ICD-10-CM

## 2022-06-08 DIAGNOSIS — S81801A Unspecified open wound, right lower leg, initial encounter: Secondary | ICD-10-CM

## 2022-06-08 MED ORDER — METOCLOPRAMIDE HCL 5 MG PO TABS
5.0000 mg | ORAL_TABLET | Freq: Four times a day (QID) | ORAL | 0 refills | Status: DC | PRN
Start: 2022-06-08 — End: 2022-09-09

## 2022-06-08 MED ORDER — OLANZAPINE 2.5 MG PO TABS
2.5000 mg | ORAL_TABLET | Freq: Every evening | ORAL | 3 refills | Status: DC
Start: 2022-06-08 — End: 2022-09-09

## 2022-06-08 MED ORDER — DOXYCYCLINE HYCLATE 100 MG PO TABS
100.0000 mg | ORAL_TABLET | Freq: Every day | ORAL | 1 refills | Status: DC
Start: 2022-06-08 — End: 2022-07-20

## 2022-06-08 MED ORDER — PROCHLORPERAZINE MALEATE 5 MG PO TABS
ORAL_TABLET | ORAL | 1 refills | Status: DC
Start: 2022-06-21 — End: 2022-06-08

## 2022-06-08 MED ORDER — ONDANSETRON HCL 8 MG PO TABS
ORAL_TABLET | ORAL | 1 refills | Status: DC
Start: 2022-06-21 — End: 2022-07-02

## 2022-06-08 NOTE — Progress Notes (Signed)
HISTORY OF PRESENT ILLNESS    [name] Wall is 88 y.o. 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  She was previously treated with NBUVB.  I met her in November 2022 when she relocated to [facility] and noted Stage 1B, limited thin patch disease.  The patient is post-CVA and in her 90's making phototherapy challenging but doable.  I recommended topical imiquimod MWF.  She used this with a brisk inflammatory response.  She has held imiquimod since 10/01/21.  Using TAC 0.1% prn.  Receiving NBUVB 6+ minutes per session twice weekly.     In early January 2023, her daughter noticed 3 new lesions which are much more raised.  She presents earlier than scheduled for urgent examination.  The MD at [facility] (phototherapy site) performed 2 punch biopsies of the new thick plaques last week.  Diagnosis was tumor stage MF, no LCT, scattered CD 30+.  She developed an open wound at the site of a tumor on the distal right leg, which was painful. In Feb 2023, morphologic change to plaques/tumors on LEs and new inguinal LAN were noted. Flow and PET updated. Opted to pursue Brentuximab treatment.     Patient has had 3 infusions of Brentuximab (1.8 mg/kg IV q 3weeks, last 02/09/22). Patient experienced side effects including weight loss, appetite suppresion, and diarrhea/constipation, which are now improved over last few weeks. She has lost about ~9 lbs (120 > 111lbs) since starting treatment. Daughter did not start the medication suggested for appetite stimulation after reading up on it online but did add some calorie supplements that she thinks has been helpful. Over last week in particular, patient's appetite is much improved and back at baseline. About 1 week ago, daughter also notes that a couple of the larger plaques on the anterior shin (mid and distal) have superficially opened and become shallow wounds. There is no pain or pruritus associated. Patient was previously attending wound care clinic for open wounds on her  posterior right calf, which are healed and doing better. Currently using alcohol wipe, gentamicin, and medihoney on the wounds. Unfortunately, she has new additional lesions on the right leg. Still doing NBUVB 2x/week.  Last seen 2 weeks ago.  She and her daughter note acute worsening.    No history of other skin cancers. Negative family history of skin cancer and lymphoma.       Current Outpatient Medications:     ALPRAZolam (XANAX) 0.25 MG tablet, Take 1 tablet (0.25 mg) by mouth daily as needed for Anxiety, Disp: 30 tablet, Rfl: 0    amLODIPine (NORVASC) 10 MG tablet, Take 1 tablet (10 mg) by mouth daily, Disp: , Rfl:     aspirin 81 MG EC tablet, Take 1 tablet (81 mg) by mouth, Disp: , Rfl:     atorvastatin (LIPITOR) 40 MG tablet, Take 1 tablet (40 mg) by mouth every 24 hours, Disp: , Rfl:     ferrous gluconate (FERGON) 324 MG tablet, Take 1 tablet (324 mg) by mouth every morning with breakfast, Disp: 90 tablet, Rfl: 3    gentamicin (GARAMYCIN) 0.1 % ointment, APPLY UP TO TWICE A DAY ON CRUSTED AREAS UNTIL RESOLVED, Disp: , Rfl:     hydrALAZINE (APRESOLINE) 50 MG tablet, Take 1 tablet (50 mg) by mouth 3 (three) times daily with meals, Disp: , Rfl:     hydrOXYzine (ATARAX) 10 MG tablet, Take 1 tablet (10 mg) by mouth every 8 (eight) hours as needed for Itching, Disp: 30 tablet, Rfl: 1  imiquimod (ALDARA) 5 % cream, Apply nightly three times a week (Monday, Wednesday, Friday) to behind left knee and right ankle, Disp: 96 each, Rfl: 2    latanoprost (XALATAN) 0.005 % ophthalmic solution, INSTILL 1 DROP INTO EACH EYE AT BEDTIME, Disp: , Rfl:     levETIRAcetam (KEPPRA) 500 MG tablet, Take 1 tablet (500 mg) by mouth 2 (two) times daily, Disp: , Rfl:     levothyroxine (SYNTHROID) 25 MCG tablet, Take 1 tablet (25 mcg) by mouth daily, Disp: , Rfl:     losartan (COZAAR) 50 MG tablet, Take 1 tablet (50 mg) by mouth daily, Disp: , Rfl:     mirtazapine (REMERON) 7.5 MG tablet, Take 1 tablet (7.5 mg) by mouth nightly, Disp:  30 tablet, Rfl: 11    pantoprazole (PROTONIX) 40 MG tablet, Take 1 tablet (40 mg) by mouth daily, Disp: 90 tablet, Rfl: 1    SSD 1 % cream, , Disp: , Rfl:     tacrolimus (PROTOPIC) 0.1 % ointment, Apply topically, Disp: , Rfl:     triamcinolone (KENALOG) 0.1 % cream, Apply topically, Disp: , Rfl:     triamcinolone (KENALOG) 0.1 % ointment, as needed, Disp: , Rfl:       Allergies   Allergen Reactions    Penicillins      Past Medical History:   Diagnosis Date    Hypercholesteremia     Hypertension     Seizures     Stroke      Past Surgical History:   Procedure Laterality Date    HAND, CLOSED REDUCTION  01/29/2019    right wrist     Family History   Problem Relation Age of Onset    Breast cancer Sister      Social History     Socioeconomic History    Marital status: Widowed   Tobacco Use    Smoking status: Never    Smokeless tobacco: Never   Vaping Use    Vaping Use: Never used   Substance and Sexual Activity    Alcohol use: Not Currently    Drug use: Never    Sexual activity: Not Currently     PHYSICAL EXAM:  There were no vitals filed for this visit.    Note.General appearance: NAD, conversant.A total body skin examination is completed including palpation of scalp and inspection of hair of scalp, eyebrows, face, chest and extremities and inspection of eccrine and apocrine glands. The following anatomic skin sites are examined with findings recorded:  Head including face - diffuse hyperpigmentation of forehead  Neck - no lesions of concern  Chest including breasts, and axillae - no lesions of concern  Abdomen - on right abdomen, there are few hyperpigmented patches; new thick plaque on the right LQ  Genitalia, groin, buttocks - no lesions of concern  Back - no lesions of concern  Right upper extremity - single hyperpigmented patch on right forearm  Left upper extremity - no lesions of concern  Right lower extremity -  multiple hyperpigmented indurated plaques on right shin, distal posterior calf and ankle, largest  ~4-5cm on anterior mid shin, most 2-3cm on ankle and distal posterior calf. Anterior mid shin and anterior/medial ankle plaques with superficial pink erosion and one area of yellow-brown crust; scattered hyperpigmented patches on right thigh  Left lower extremity - scattered hyperpigmented patches on upper and lower leg         Examination of lymph node basins is completed, including the H and N, axilla  and groin. There are  multiple 3cm and 2cm subcutaneous firm masses in the right inguinal nodal basin extending down the right thigh into popliteal fossa    Pathology reports     07/14/18        Procedure Date & Time: 11/03/2021, 09:00                              FLOW CYTOMETRY REPORT          FLOW INTERPRETATION:          PERIPHERAL BLOOD: NO IMMUNOPHENOTYPIC ABNORMALITIES IDENTIFIED          COMMENT:     A population (12%) of lymphocytes is detected. About 53% of these cells     are CD3+ T cells and 30% are NK cells. Expressed as a percentage of     total T cells, CD4+/CD7-T cells are 4.0%, and CD4+/CD26-T cells are     3.9%. The calculated CD4/CD8 ratio is about 5.7. 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 Correlation with     morphology, clinical findings and other laboratory studies is     recommended for a complete evaluation.        PET CT WHOLE BODY     HISTORY: Mycosis fungoides, primarily cutaneous lymphoma (diagnosed in  2019). Post imiquimod, which has been held since 10/01/2021. Restaging.     TECHNIQUE:      Dose: 9.99  mCi F-18 fluorodeoxyglucose (F-18 FDG)  Serum Glucose: 104 mg/dL  Time from Z-61 FDG injection to scan: 62 min     The patient fasted prior to this examination for greater than six hours.  Positron emission tomography (PET) images were obtained from the convexity  of the calvarium through the feet. As part of this examination, a  nondiagnostic CT scan without intravenous contrast was obtained over the  same region utilizing a low tube current technique  for anatomical  correlation and attenuation correction. Oral contrast was provided. Images  were reconstructed in the axial, sagittal and coronal planes.     CORRELATION: None available.     FINDINGS:      Mediastinum: SUV max 2.4  Liver: SUV max 3.2     HEAD AND NECK: Normal FDG distribution within the nasopharynx, oropharynx,  larynx and small sized thyroid gland. Limited evaluation of the brain  parenchyma demonstrates no discrete focus of increased FDG uptake. No FDG  avid cervical or supraclavicular lymph nodes. Minimal leftward deviation of  the nasal septum.     THORAX: No hypermetabolic foci within the lung parenchyma. Mildly FDG avid  subcentimeter bilateral axillary lymph nodes, likely inflammatory/reactive.  No other FDG avid thoracic lymph nodes. Coronary artery calcifications.  Aortic valvular calcifications. Small hiatal hernia. Bibasilar bandlike  linear atelectasis or scarring.     ABDOMEN AND PELVIS:   *  FDG avid right external iliac node (image 239): 0.4 x 0.7 cm with SUV  max 4.7  *  FDG avid right inguinal nodes, for example on image 243: up to 1.5 x  0.7 cm with SUV max up to 3.4     Mildly FDG avid subcentimeter left inguinal lymph nodes, likely  reactive/inflammatory.     Normal FDG distribution within the liver, spleen, mild atrophic pancreas,  and adrenal glands. Cholelithiasis. Non-FDG avid exophytic right renal  cyst, possibly containing a septation, measuring 6.0 x 4.9 cm (image 190).  Colonic  diverticula, especially within the sigmoid colon, without evidence  of diverticulitis. Multiple pelvic phleboliths.     Below the knee misregistration somewhat limits evaluation.  MUSCULOSKELETAL/EXTREMITIES:   No hypermetabolic foci within the bone marrow. Multilevel spinal  degenerative changes. No aggressive lytic or sclerotic lesion. Right total  hip arthroplasty, with associated surrounding increased uptake, likely  inflammatory. Moderate right and mild left leg subcutaneous edema with  mild  increased uptake, possibly inflammatory.     CUTANEOUS/SUBCUTANEOUS:   Tiny cutaneous foci of uptake in the lateral aspect of the right hemipelvis  (images 213-222), for example, on image 220: up to 0.2 cm in thickness with  SUV max up to 3.3     FDG avid subcutaneous nodules, with reference examples as follows:  *  Right inguinal/proximal right thigh subcutaneous soft tissue nodule  (image 268): 1.9 x 2.2 cm with SUV max 6.6  *  Proximal right thigh anteromedial subcutaneous soft tissue nodule  (image 279): 2.7 x 3.6 cm with SUV max 14.7     Multiple cutaneous foci of uptake in the right lower extremity, below the  knee, with reference examples as follows:  *  Curvilinear cutaneous uptake along the anterolateral mid right leg  (image 427): 0.5 cm in thickness with SUV max 5.1  *  Cutaneous uptake within the anteromedial aspect of the mid right leg  (image 431): 0.2 cm in thickness with SUV max 5.2  *  Cutaneous uptake along the medial and posteromedial right leg (images  449-477): up to 0.4 cm in thickness with SUV max up to 7.7     Cutaneous/subcutaneous foci of uptake within the left lower extremity, as  follows:  *  Cutaneous focus of uptake within the posterior distal left thigh  (image 349): 0.3 cm in thickness with SUV max 3.5  *  At least 3 foci of cutaneous uptake lateral to the left knee (image  [phone]), for example on image 372: up to 0.4 cm in thickness with SUV max  up to 9.3  *  Cutaneous/subcutaneous focus of uptake within the mid left leg (image  425): 0.5 x 0.3 cm with SUV max 4.7     IMPRESSION:      1. FDG avid cutaneous foci of uptake within the bilateral lower  extremities, particularly at and below the knees, is consistent with  malignancy.  2. FDG avid subcutaneous nodules within the right inguinal/right proximal  thigh region is consistent with malignancy.  3. FDG avid right inguinal and right external iliac lymph nodes are  suspicious for nodal metastatic disease.    Assessment &  Plan:   Pt is a [id number] female with Stage 2B CTCL (T3, N1, M0, B0)  MF subtype; last seen 2 weeks ago.  Wound care provider directed the patient to RTC.    Stage 2B CTCL, MF subtype  - Morphologic changes and LAN emerged February 2023, now s/p Brentuximab x3 infusions (last 02/09/22), still receiving nbUVB 2x/week  - Biopsy shows CD30+ tumors  - PET CT Feb 2023 shows diffuse skin lesions and dermatopathic vs. Early malignancy in skin draining inguinal nodes  - Flow, TCR, CBC Feb 2023 without evidence of blood tumor burden  - Right groin with multiple bulky subcutaneous masses tracking down the nodal basin c/w nodal disease  - We had a lengthy discussion of the patient's disease and therapy in the setting of being 88 years old and relatively frail post-CVA  - Given worsening of skin lesions and bulky subQ  disease, will plan for Doxil to begin in 2 weeks    2. Superficial erosion of plaques on right shin  - Previous open wounds now improved on right posterior calf with wound care, now with new superficial erosions on right anterior shin tumors  - Doxy 100mg  po qd for infection prevention in setting of open wounds      Ferne Reus, MD  Director, Cutaneous Lymphoma and   High Risk/Transplant Dermatology   [facility]  [facility]oma [facility]  [address]  T [phone]  F [phone]  [email or web address]

## 2022-06-08 NOTE — Progress Notes (Addendum)
[facility]       Date Time: June 08, 2022   Patient Name: Wall,[name]   Referring Provider: Layne Bentonreherne, Annyce C, MD    Primary Dx: Mycosis Fungoides, stage 2B      ONCOLOGY CARE TEAM  Dermatology: Russella DarAlexander Fischer, MD  Derm Oncology: Ferne ReusJennifer DeSimone, MD  Radiation Oncology:  Quentin MullingSarah Gao, MD      HISTORY OF PRESENT ILLNESS  [name] Wall is 88 y.o. female h/o HTN, HLD, CVA with chronic R-sided weakness, and Mycosis Fungoides since 2019. She was previously treated with NBUVB.  She established care here at [facility] in November 2022 when [facility]. She developed tumor stage MF in Jan 2023 despite NVUBV, therefore, consensus recommendation was to treat with brentuximab.     She's here follow s/p brentuximab C3, in the company of her daughter for expedited follow up. She notes that a couple of the larger plaques on the anterior shin (mid and distal) have continued to grow, now with much larger open wounds around R ankle. Lymphadenopathy worsening. There is no pain or pruritus associated, some thin yellowish secretions. No fevers/chills, but more sleepy, particularly after taking remeron (stopped after 1 dose d/t excess sedation). She was seen at the wound care center local to her, who were very alarmed at the changes and would like her to be seen at the main [facility] wound center for ongoing care.     She continues to tolerate NVUBV once a week, 8min 15 sec per session.     She has chronic left side weakness from CVA in 2022, and R hand/wrist weakness s/p broken wrist, largely wheelchair bound. Denies any peripheral neuropathy such as numbness or tingling. She is frustrated by her lack of mobility. She finds it harder to walk now. Weight continues to decline. Daughter is concerned she had another "mini-stroke" amidst all these worsening symptoms.    No history of other skin cancers. Negative family history of skin cancer and lymphoma.      MEDICATIONS:    Current Outpatient Medications:     amLODIPine (NORVASC) 10 MG tablet, Take 1 tablet (10 mg) by mouth daily, Disp: , Rfl:     aspirin 81 MG EC tablet, Take 1 tablet (81 mg) by mouth, Disp: , Rfl:     atorvastatin (LIPITOR) 40 MG tablet, Take 1 tablet (40 mg) by mouth every 24 hours, Disp: , Rfl:     ferrous gluconate (FERGON) 324 MG tablet, Take 1 tablet (324 mg) by mouth every morning with breakfast, Disp: 90 tablet, Rfl: 3    gentamicin (GARAMYCIN) 0.1 % ointment, APPLY UP TO TWICE A DAY ON CRUSTED AREAS UNTIL RESOLVED, Disp: , Rfl:     hydrALAZINE (APRESOLINE) 50 MG tablet, Take 1 tablet (50 mg) by mouth 3 (three) times daily with meals, Disp: , Rfl:     hydrOXYzine (ATARAX) 10 MG tablet, Take 1 tablet (10 mg) by mouth every 8 (eight) hours as needed for Itching, Disp: 30 tablet, Rfl: 1    levETIRAcetam (KEPPRA) 500 MG tablet, Take 1 tablet (500 mg) by mouth 2 (two) times daily, Disp: , Rfl:     levothyroxine (SYNTHROID) 25 MCG tablet, Take 1 tablet (25 mcg) by mouth daily, Disp: , Rfl:     losartan (COZAAR) 50 MG tablet, Take 1 tablet (50 mg) by mouth daily, Disp: , Rfl:     mirtazapine (REMERON) 7.5 MG tablet, Take 1 tablet (7.5 mg) by mouth nightly,  Disp: 30 tablet, Rfl: 11    pantoprazole (PROTONIX) 40 MG tablet, Take 1 tablet (40 mg) by mouth daily, Disp: 90 tablet, Rfl: 1    SSD 1 % cream, , Disp: , Rfl:     tacrolimus (PROTOPIC) 0.1 % ointment, Apply topically, Disp: , Rfl:     triamcinolone (KENALOG) 0.1 % cream, Apply topically, Disp: , Rfl:     triamcinolone (KENALOG) 0.1 % ointment, as needed, Disp: , Rfl:     ALPRAZolam (XANAX) 0.25 MG tablet, Take 1 tablet (0.25 mg) by mouth daily as needed for Anxiety (Patient not taking: Reported on 06/08/2022), Disp: 30 tablet, Rfl: 0    imiquimod (ALDARA) 5 % cream, Apply nightly three times a week (Monday, Wednesday, Friday) to behind left knee and right ankle (Patient not taking: Reported on 06/08/2022), Disp: 96 each, Rfl: 2    latanoprost  (XALATAN) 0.005 % ophthalmic solution, INSTILL 1 DROP INTO EACH EYE AT BEDTIME (Patient not taking: Reported on 06/08/2022), Disp: , Rfl:     Allergies   Allergen Reactions    Penicillins      Past Medical History:   Diagnosis Date    Hypercholesteremia     Hypertension     Seizures     Stroke      Past Surgical History:   Procedure Laterality Date    HAND, CLOSED REDUCTION  01/29/2019    right wrist     Family History   Problem Relation Age of Onset    Breast cancer Sister      Social History     Socioeconomic History    Marital status: Widowed   Tobacco Use    Smoking status: Never    Smokeless tobacco: Never   Vaping Use    Vaping Use: Never used   Substance and Sexual Activity    Alcohol use: Not Currently    Drug use: Never    Sexual activity: Not Currently     PHYSICAL EXAM:  Vitals:    06/08/22 1141   BP: 134/73   Pulse: 83   Temp: 97 F (36.1 C)   SpO2: 100%     Wt Readings from Last 3 Encounters:   06/08/22 48.3 kg (106 lb 6.4 oz)   06/08/22 48.3 kg (106 lb 6.4 oz)   05/25/22 49.8 kg (109 lb 11.2 oz)     ECOG PS: 1-2  General: NAD, seated in wheelchair, very slow in standing up or walking  Eyes: Anicteric, normal conjunctiva  ENT: No sores in mouth  CV: RRR  RESP: CTA B  GI: NT/ND, no HSM  Neuro: AAO x 3, slow/stuttering gait, R > L weakness  MS: no swelling in arms or legs  SKIN:   - Head including face - diffuse hyperpigmentation of forehead, thinning hair with receding hair line  - Right lower extremity -  multiple hyperpigmented indurated plaques on right shin, distal posterior calf and ankle, largest ~4-5cm on anterior mid shin, most 2-3cm on ankle and distal posterior calf (WORSENING, now tumor stage). Anterior mid shin and anterior/medial ankle plaques with large, deep erosions w/o surrounding erythema or tenderness (WORSENING); scattered hyperpigmented patches on right thigh  - Left lower extremity - scattered hyperpigmented patches on upper and lower leg; new erythematous patches on L anterior  thigh  - Right upper extremity - single hyperpigmented patch on right forearm  - Abdomen - hyperpigmented plaque on RLQ with small open erosion (WORSENING)   LYMPH: +multiple R subcutaneous 4-6cm lymph node  conglomerates and inguinal adenopathy (WORSENING); negative cervical suplarclavicular axillary     04/13/22            02/09/22        01/19/22            12/29/21  No significant interval change in MF  No sign of infection in right posterior ankle open wound    12/14/21              LABS:  Lab Results   Component Value Date    WBC 4.47 02/09/2022    HGB 11.5 02/09/2022    HCT 35.1 02/09/2022    MCV 78.5 02/09/2022    PLT 358 (H) 02/09/2022     Lab Results   Component Value Date    CREAT 0.8 02/09/2022    BUN 9.0 02/09/2022    NA 137 02/09/2022    K 3.1 (L) 02/09/2022    CL 100 02/09/2022    CO2 28 02/09/2022     Lab Results   Component Value Date    ALT 18 02/09/2022    AST 34 02/09/2022    ALKPHOS 85 02/09/2022    BILITOTAL 0.6 02/09/2022         IMAGING:  PET CT WHOLE BODY 11/19/2021  FINDINGS:   Mediastinum: SUV max 2.4  Liver: SUV max 3.2     HEAD AND NECK: Normal FDG distribution within the nasopharynx, oropharynx,  larynx and small sized thyroid gland. Limited evaluation of the brain  parenchyma demonstrates no discrete focus of increased FDG uptake. No FDG  avid cervical or supraclavicular lymph nodes. Minimal leftward deviation of  the nasal septum.     THORAX: No hypermetabolic foci within the lung parenchyma. Mildly FDG avid  subcentimeter bilateral axillary lymph nodes, likely inflammatory/reactive.  No other FDG avid thoracic lymph nodes. Coronary artery calcifications.  Aortic valvular calcifications. Small hiatal hernia. Bibasilar bandlike  linear atelectasis or scarring.     ABDOMEN AND PELVIS:   *  FDG avid right external iliac node (image 239): 0.4 x 0.7 cm with SUV  max 4.7  *  FDG avid right inguinal nodes, for example on image 243: up to 1.5 x  0.7 cm with SUV max up to 3.4     Mildly FDG avid  subcentimeter left inguinal lymph nodes, likely  reactive/inflammatory.     Normal FDG distribution within the liver, spleen, mild atrophic pancreas,  and adrenal glands. Cholelithiasis. Non-FDG avid exophytic right renal  cyst, possibly containing a septation, measuring 6.0 x 4.9 cm (image 190).  Colonic diverticula, especially within the sigmoid colon, without evidence  of diverticulitis. Multiple pelvic phleboliths.     Below the knee misregistration somewhat limits evaluation.    MUSCULOSKELETAL/EXTREMITIES:   No hypermetabolic foci within the bone marrow. Multilevel spinal  degenerative changes. No aggressive lytic or sclerotic lesion. Right total  hip arthroplasty, with associated surrounding increased uptake, likely  inflammatory. Moderate right and mild left leg subcutaneous edema with mild  increased uptake, possibly inflammatory.     CUTANEOUS/SUBCUTANEOUS:   Tiny cutaneous foci of uptake in the lateral aspect of the right hemipelvis  (images 213-222), for example, on image 220: up to 0.2 cm in thickness with  SUV max up to 3.3     FDG avid subcutaneous nodules, with reference examples as follows:  *  Right inguinal/proximal right thigh subcutaneous soft tissue nodule  (image 268): 1.9 x 2.2 cm with SUV max 6.6  *  Proximal right  thigh anteromedial subcutaneous soft tissue nodule  (image 279): 2.7 x 3.6 cm with SUV max 14.7     Multiple cutaneous foci of uptake in the right lower extremity, below the  knee, with reference examples as follows:  *  Curvilinear cutaneous uptake along the anterolateral mid right leg  (image 427): 0.5 cm in thickness with SUV max 5.1  *  Cutaneous uptake within the anteromedial aspect of the mid right leg  (image 431): 0.2 cm in thickness with SUV max 5.2  *  Cutaneous uptake along the medial and posteromedial right leg (images  449-477): up to 0.4 cm in thickness with SUV max up to 7.7     Cutaneous/subcutaneous foci of uptake within the left lower extremity, as  follows:  *   Cutaneous focus of uptake within the posterior distal left thigh  (image 349): 0.3 cm in thickness with SUV max 3.5  *  At least 3 foci of cutaneous uptake lateral to the left knee (image  [phone]), for example on image 372: up to 0.4 cm in thickness with SUV max  up to 9.3  *  Cutaneous/subcutaneous focus of uptake within the mid left leg (image  425): 0.5 x 0.3 cm with SUV max 4.7     IMPRESSION:   1. FDG avid cutaneous foci of uptake within the bilateral lower  extremities, particularly at and below the knees, is consistent with  malignancy.  2. FDG avid subcutaneous nodules within the right inguinal/right proximal  thigh region is consistent with malignancy.  3. FDG avid right inguinal and right external iliac lymph nodes are  suspicious for nodal metastatic disease.    PATHOLOGY    07/14/18            Procedure Date & Time: 11/03/2021, 09:00                              FLOW CYTOMETRY REPORT          FLOW INTERPRETATION:          PERIPHERAL BLOOD: NO IMMUNOPHENOTYPIC ABNORMALITIES IDENTIFIED          COMMENT:     A population (12%) of lymphocytes is detected. About 53% of these cells     are CD3+ T cells and 30% are NK cells. Expressed as a percentage of     total T cells, CD4+/CD7-T cells are 4.0%, and CD4+/CD26-T cells are     3.9%. The calculated CD4/CD8 ratio is about 5.7. 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 Correlation with     morphology, clinical findings and other laboratory studies is     recommended for a complete evaluation.       Assessment & Plan:   [name] Wall is 88 y.o. FST5 female h/o HTN, HLD, CVA with chronic R-sided weakness, and Mycosis Fungoides since 2019. She recently developed new nodules and lymphadenopathy with biopsy proven advancement to tumor stage disease, Stage 2B CTCL, MF subtype. Given scattered CD30+ disease, we recommended patient to receive brentuximab, s/p 3 cycles with mixed response to date, now with clinically progressive plaques  and lymph node conglomerates on the RLE.     After another long discussion about the pros/cons, particularly her difficult experience with recent chemotherapy (anorexia, nausea, weight loss, fatigue) and advanced age, will cautiously plan to restart chemotherapy given significant progression of disease, now with open ulcers all around her  R ankle. We recommend treatment with Doxil.  We reviewed the dose (20mg /m3), schedule (every 3 weeks), route (IV), potential benefits and side effects (including but not limited to cytopenias, nausea/vomiting, anorexia/weight loss, cardiotoxicity). Given her advanced age and comorbidities, we will plan to re-evaluate frequently for tolerability and side effects, with a low threshold to stop for toxicity. I did explain that if/when that occurs, we would recommend transition to hospice at that point.    Stage 2B CTCL, MF subtype  - PET CT shows diffuse skin lesions and sub cutaneous masses near skin draining R inguinal nodes  - Flow, TCR, CBC without evidence of blood tumor burden; will recheck flow with the start of treatment  - s/p Brentuximab 1.8mg /kg IV q3wks x3 doses, c/b fatigue, nausea, anorexia, weight loss, limiting further treatment  - Systemic Treatment: Doxil 20mg /m2 IV q3wks    -- C1D1 = 06/22/22 (tentative)    -- Nausea ppx: ondansetron 8mg  IV w/infusion; olanzapine 2.5mg  PO QHS    -- Nausea PRN: ondansetron 8mg  PO Q8hrs vs reglan 5mg  PO Q6hrs PRN    -- Check TTE prior to tx initiation, scheduled 06/12/22  - Continue concurrent skin-directed therapy (NBUVB) once weekly with primary derm Dr. Chandra Batch  - Appreciate co-management with Dr. Bryson Ha    2. Deep erosion of plaques on right shin/ankle  - Transition to wound care at [facility]  - Doxycycline 100mg  PO daily for antimicrobial ppx  - Continue topical therapies as directed by wound care + Dr. Bryson Ha    3. Anorexia, weight loss, severe protein-calorie malnutrition  - Likely 2/2 brentuximab and advanced age, improving since  time off treatment  - Rx for olanzapine 2.5mg  PO QHS for appetite stimulation, never picked up d/t concerns about psychiatric effects; re-discussed today, and given that we are restarting chemotherapy they are now accepting  - Monitor for excess sedation    RTC in 2wks        Sufian Ravi Al-Mondhiry, MD, MA  Medical Oncologist, Cutaneous Malignancies Program  Assistant Professor of Medical Education, [employer]  [facility]  [name], [id number]  [place], [place] [address]  Phone: [phone]  Fax: [phone]

## 2022-06-08 NOTE — Addendum Note (Signed)
Addended by: Synthia Innocent. on: 06/08/2022 04:53 PM     Modules accepted: Orders

## 2022-06-09 ENCOUNTER — Telehealth: Payer: Self-pay

## 2022-06-09 ENCOUNTER — Ambulatory Visit: Payer: Medicare Other | Attending: Foot & Ankle Surgery

## 2022-06-09 DIAGNOSIS — E785 Hyperlipidemia, unspecified: Secondary | ICD-10-CM | POA: Insufficient documentation

## 2022-06-09 DIAGNOSIS — S81801D Unspecified open wound, right lower leg, subsequent encounter: Secondary | ICD-10-CM

## 2022-06-09 DIAGNOSIS — L97812 Non-pressure chronic ulcer of other part of right lower leg with fat layer exposed: Secondary | ICD-10-CM | POA: Insufficient documentation

## 2022-06-09 DIAGNOSIS — Z85828 Personal history of other malignant neoplasm of skin: Secondary | ICD-10-CM | POA: Insufficient documentation

## 2022-06-09 DIAGNOSIS — Z8673 Personal history of transient ischemic attack (TIA), and cerebral infarction without residual deficits: Secondary | ICD-10-CM | POA: Insufficient documentation

## 2022-06-09 DIAGNOSIS — L97811 Non-pressure chronic ulcer of other part of right lower leg limited to breakdown of skin: Secondary | ICD-10-CM | POA: Insufficient documentation

## 2022-06-09 DIAGNOSIS — I10 Essential (primary) hypertension: Secondary | ICD-10-CM | POA: Insufficient documentation

## 2022-06-09 NOTE — Telephone Encounter (Signed)
Called daughter [name], to review two upcoming appointments for TTE on 06/12/22 and Doxil C1D1 on 06/25/22.  Daughter verbalized understanding of information provided.  No additional needs identified at this time; patient encouraged to call with any new questions or concerns.      TTE scheduled for 06/12/22 at 11 AM at [facility], 6th floor  C1D1 scheduled for 06/25/22 at 9 AM, with labs prior at 8:45 AM

## 2022-06-09 NOTE — Telephone Encounter (Signed)
[name], the patients daughter, called asking if the patient could proceed as scheduled with phototherapy today. Discussed with Dr. Dickey Gave and advised [name] to have the patient proceed with phototherapy as planned. [name] had no further questions or concerns at this time.

## 2022-06-12 ENCOUNTER — Ambulatory Visit
Admission: RE | Admit: 2022-06-12 | Discharge: 2022-06-12 | Disposition: A | Discharge status: Home / Self Care | Payer: Medicare Other | Source: Ambulatory Visit | Attending: Student in an Organized Health Care Education/Training Program | Admitting: Student in an Organized Health Care Education/Training Program

## 2022-06-12 DIAGNOSIS — C8405 Mycosis fungoides, lymph nodes of inguinal region and lower limb: Secondary | ICD-10-CM | POA: Insufficient documentation

## 2022-06-12 DIAGNOSIS — I517 Cardiomegaly: Secondary | ICD-10-CM | POA: Insufficient documentation

## 2022-06-12 DIAGNOSIS — I5189 Other ill-defined heart diseases: Secondary | ICD-10-CM | POA: Insufficient documentation

## 2022-06-12 DIAGNOSIS — I083 Combined rheumatic disorders of mitral, aortic and tricuspid valves: Secondary | ICD-10-CM | POA: Insufficient documentation

## 2022-06-14 ENCOUNTER — Other Ambulatory Visit: Payer: Self-pay | Admitting: Medical

## 2022-06-14 MED ORDER — LEVOFLOXACIN 250 MG PO TABS
250.0000 mg | ORAL_TABLET | Freq: Every day | ORAL | 0 refills | Status: AC
Start: 2022-06-14 — End: 2022-06-24

## 2022-06-17 ENCOUNTER — Ambulatory Visit: Payer: Medicare Other | Attending: Medical

## 2022-06-17 DIAGNOSIS — L97212 Non-pressure chronic ulcer of right calf with fat layer exposed: Secondary | ICD-10-CM | POA: Insufficient documentation

## 2022-06-17 DIAGNOSIS — L97812 Non-pressure chronic ulcer of other part of right lower leg with fat layer exposed: Secondary | ICD-10-CM | POA: Insufficient documentation

## 2022-06-17 DIAGNOSIS — Z9221 Personal history of antineoplastic chemotherapy: Secondary | ICD-10-CM | POA: Insufficient documentation

## 2022-06-17 DIAGNOSIS — C84A5 Cutaneous T-cell lymphoma, unspecified, lymph nodes of inguinal region and lower limb: Secondary | ICD-10-CM | POA: Insufficient documentation

## 2022-06-17 DIAGNOSIS — R600 Localized edema: Secondary | ICD-10-CM

## 2022-06-17 DIAGNOSIS — S81801D Unspecified open wound, right lower leg, subsequent encounter: Secondary | ICD-10-CM

## 2022-06-17 DIAGNOSIS — L98492 Non-pressure chronic ulcer of skin of other sites with fat layer exposed: Secondary | ICD-10-CM | POA: Insufficient documentation

## 2022-06-17 DIAGNOSIS — I10 Essential (primary) hypertension: Secondary | ICD-10-CM | POA: Insufficient documentation

## 2022-06-17 DIAGNOSIS — L97811 Non-pressure chronic ulcer of other part of right lower leg limited to breakdown of skin: Secondary | ICD-10-CM

## 2022-06-17 DIAGNOSIS — I69944 Monoplegia of lower limb following unspecified cerebrovascular disease affecting left non-dominant side: Secondary | ICD-10-CM | POA: Insufficient documentation

## 2022-06-17 DIAGNOSIS — E785 Hyperlipidemia, unspecified: Secondary | ICD-10-CM | POA: Insufficient documentation

## 2022-06-18 ENCOUNTER — Telehealth: Payer: Self-pay

## 2022-06-18 NOTE — Telephone Encounter (Signed)
Called daughter to review upcoming appointment times for 06/25/2022.  Reviewed that Dr. Dickey Gave will see patient prior to her infusion; daughter verbalized understanding.  Also reviewed appointment with Dr. Bryson Ha on 07/06/22 and subsequent infusion on 07/20/22.  No additional needs identified at this time; daughter encouraged to call with any new questions or concerns.

## 2022-06-22 ENCOUNTER — Ambulatory Visit: Payer: Medicare Other | Admitting: Student in an Organized Health Care Education/Training Program

## 2022-06-22 ENCOUNTER — Other Ambulatory Visit: Payer: Medicare Other

## 2022-06-22 ENCOUNTER — Encounter: Payer: Self-pay | Admitting: Student in an Organized Health Care Education/Training Program

## 2022-06-24 ENCOUNTER — Ambulatory Visit: Payer: Medicare Other | Attending: Medical

## 2022-06-24 DIAGNOSIS — E785 Hyperlipidemia, unspecified: Secondary | ICD-10-CM | POA: Insufficient documentation

## 2022-06-24 DIAGNOSIS — L97212 Non-pressure chronic ulcer of right calf with fat layer exposed: Secondary | ICD-10-CM | POA: Insufficient documentation

## 2022-06-24 DIAGNOSIS — L98492 Non-pressure chronic ulcer of skin of other sites with fat layer exposed: Secondary | ICD-10-CM | POA: Insufficient documentation

## 2022-06-24 DIAGNOSIS — Z85828 Personal history of other malignant neoplasm of skin: Secondary | ICD-10-CM | POA: Insufficient documentation

## 2022-06-24 DIAGNOSIS — Z8673 Personal history of transient ischemic attack (TIA), and cerebral infarction without residual deficits: Secondary | ICD-10-CM | POA: Insufficient documentation

## 2022-06-24 DIAGNOSIS — S81801D Unspecified open wound, right lower leg, subsequent encounter: Secondary | ICD-10-CM

## 2022-06-24 DIAGNOSIS — R600 Localized edema: Secondary | ICD-10-CM

## 2022-06-24 DIAGNOSIS — C84A0 Cutaneous T-cell lymphoma, unspecified, unspecified site: Secondary | ICD-10-CM | POA: Insufficient documentation

## 2022-06-24 DIAGNOSIS — I10 Essential (primary) hypertension: Secondary | ICD-10-CM | POA: Insufficient documentation

## 2022-06-24 DIAGNOSIS — L97812 Non-pressure chronic ulcer of other part of right lower leg with fat layer exposed: Secondary | ICD-10-CM | POA: Insufficient documentation

## 2022-06-24 DIAGNOSIS — L97811 Non-pressure chronic ulcer of other part of right lower leg limited to breakdown of skin: Secondary | ICD-10-CM

## 2022-06-25 ENCOUNTER — Other Ambulatory Visit: Payer: Medicare Other

## 2022-06-25 ENCOUNTER — Ambulatory Visit
Payer: Medicare Other | Attending: Student in an Organized Health Care Education/Training Program | Admitting: Student in an Organized Health Care Education/Training Program

## 2022-06-25 ENCOUNTER — Telehealth: Payer: Self-pay

## 2022-06-25 ENCOUNTER — Encounter: Payer: Self-pay | Admitting: Physician Assistant

## 2022-06-25 ENCOUNTER — Ambulatory Visit (HOSPITAL_BASED_OUTPATIENT_CLINIC_OR_DEPARTMENT_OTHER): Payer: Medicare Other

## 2022-06-25 ENCOUNTER — Encounter: Payer: Self-pay | Admitting: Student in an Organized Health Care Education/Training Program

## 2022-06-25 ENCOUNTER — Ambulatory Visit (HOSPITAL_BASED_OUTPATIENT_CLINIC_OR_DEPARTMENT_OTHER): Payer: Medicare Other | Admitting: Physician Assistant

## 2022-06-25 VITALS — BP 135/66 | HR 90 | Temp 97.7°F | Resp 20

## 2022-06-25 DIAGNOSIS — C8405 Mycosis fungoides, lymph nodes of inguinal region and lower limb: Secondary | ICD-10-CM

## 2022-06-25 DIAGNOSIS — Z5111 Encounter for antineoplastic chemotherapy: Secondary | ICD-10-CM

## 2022-06-25 DIAGNOSIS — T8090XA Unspecified complication following infusion and therapeutic injection, initial encounter: Secondary | ICD-10-CM

## 2022-06-25 LAB — COMPREHENSIVE METABOLIC PANEL
ALT: 14 U/L (ref 0–55)
AST (SGOT): 31 U/L (ref 5–41)
Albumin/Globulin Ratio: 0.6 — ABNORMAL LOW (ref 0.9–2.2)
Albumin: 3.1 g/dL — ABNORMAL LOW (ref 3.5–5.0)
Alkaline Phosphatase: 83 U/L (ref 37–117)
Anion Gap: 11 (ref 5.0–15.0)
BUN: 18 mg/dL (ref 7.0–21.0)
Bilirubin, Total: 0.4 mg/dL (ref 0.2–1.2)
CO2: 31 mEq/L — ABNORMAL HIGH (ref 17–29)
Calcium: 13 mg/dL — ABNORMAL HIGH (ref 7.9–10.2)
Chloride: 100 mEq/L (ref 99–111)
Creatinine: 1.2 mg/dL — ABNORMAL HIGH (ref 0.4–1.0)
Globulin: 5.5 g/dL — ABNORMAL HIGH (ref 2.0–3.6)
Glucose: 164 mg/dL — ABNORMAL HIGH (ref 70–100)
Potassium: 3.6 mEq/L (ref 3.5–5.3)
Protein, Total: 8.6 g/dL — ABNORMAL HIGH (ref 6.0–8.3)
Sodium: 142 mEq/L (ref 135–145)
eGFR: 41.5 mL/min/{1.73_m2} — AB (ref >=60)

## 2022-06-25 LAB — CBC AND DIFFERENTIAL
Absolute NRBC: 0 10*3/uL (ref 0.00–0.00)
Basophils Absolute Automated: 0.03 10*3/uL (ref 0.00–0.08)
Basophils Automated: 0.3 %
Eosinophils Absolute Automated: 0.13 10*3/uL (ref 0.00–0.44)
Eosinophils Automated: 1.3 %
Hematocrit: 36.7 % (ref 34.7–43.7)
Hgb: 11.6 g/dL (ref 11.4–14.8)
Immature Granulocytes Absolute: 0.03 10*3/uL (ref 0.00–0.07)
Immature Granulocytes: 0.3 %
Instrument Absolute Neutrophil Count: 8.14 10*3/uL — ABNORMAL HIGH (ref 1.10–6.33)
Lymphocytes Absolute Automated: 0.56 10*3/uL (ref 0.42–3.22)
Lymphocytes Automated: 5.8 %
MCH: 25.5 pg (ref 25.1–33.5)
MCHC: 31.6 g/dL (ref 31.5–35.8)
MCV: 80.7 fL (ref 78.0–96.0)
MPV: 9.5 fL (ref 8.9–12.5)
Monocytes Absolute Automated: 0.77 10*3/uL (ref 0.21–0.85)
Monocytes: 8 %
Neutrophils Absolute: 8.14 10*3/uL — ABNORMAL HIGH (ref 1.10–6.33)
Neutrophils: 84.3 %
Nucleated RBC: 0 /100 WBC (ref 0.0–0.0)
Platelets: 310 10*3/uL (ref 142–346)
RBC: 4.55 10*6/uL (ref 3.90–5.10)
RDW: 14 % (ref 11–15)
WBC: 9.66 10*3/uL — ABNORMAL HIGH (ref 3.10–9.50)

## 2022-06-25 LAB — PHOSPHORUS: Phosphorus: 4.3 mg/dL (ref 2.3–4.7)

## 2022-06-25 LAB — BLOOD FLOW CYTOMETRY

## 2022-06-25 LAB — HEMOLYSIS INDEX
Hemolysis Index: 10 Index (ref 0–24)
Hemolysis Index: 15 Index (ref 0–24)

## 2022-06-25 LAB — MAGNESIUM: Magnesium: 1.9 mg/dL (ref 1.6–2.6)

## 2022-06-25 MED ORDER — LORAZEPAM 1 MG PO TABS
1.0000 mg | ORAL_TABLET | Freq: Once | ORAL | Status: DC | PRN
Start: 2022-06-25 — End: 2022-06-27

## 2022-06-25 MED ORDER — METHYLPREDNISOLONE SODIUM SUCC 40 MG IJ SOLR (WRAP)
40.0000 mg | Freq: Once | INTRAMUSCULAR | Status: DC | PRN
Start: 2022-06-25 — End: 2022-06-27

## 2022-06-25 MED ORDER — DOXORUBICIN HCL LIPOSOMAL 2 MG/ML IV INJ
20.0000 mg/m2 | Freq: Once | INTRAVENOUS | Status: AC
Start: 2022-06-25 — End: 2022-06-25
  Administered 2022-06-25: 29 mg via INTRAVENOUS
  Filled 2022-06-25: qty 14.5

## 2022-06-25 MED ORDER — ONDANSETRON IVPB 8 MG IN NS 50 ML
8.0000 mg | Freq: Once | INTRAVENOUS | Status: DC | PRN
Start: 2022-06-25 — End: 2022-06-27

## 2022-06-25 MED ORDER — SODIUM CHLORIDE 0.9 % IV BOLUS
500.0000 mL | Freq: Once | INTRAVENOUS | Status: DC
Start: 2022-06-25 — End: 2022-06-27

## 2022-06-25 MED ORDER — EPINEPHRINE HCL 1 MG/ML ADULT ANAPHYLAXIS KIT
0.3000 mg | Freq: Once | INTRAMUSCULAR | Status: DC | PRN
Start: 2022-06-25 — End: 2022-06-27

## 2022-06-25 MED ORDER — ALBUTEROL SULFATE (2.5 MG/3ML) 0.083% IN NEBU
2.5000 mg | INHALATION_SOLUTION | Freq: Once | RESPIRATORY_TRACT | Status: DC | PRN
Start: 2022-06-25 — End: 2022-06-27

## 2022-06-25 MED ORDER — ONDANSETRON HCL 4 MG/2ML IJ SOLN
8.0000 mg | Freq: Once | INTRAMUSCULAR | Status: AC
Start: 2022-06-25 — End: 2022-06-25
  Administered 2022-06-25: 8 mg via INTRAVENOUS
  Filled 2022-06-25 (×2): qty 4

## 2022-06-25 MED ORDER — DIPHENHYDRAMINE HCL 50 MG/ML IJ SOLN
25.0000 mg | Freq: Once | INTRAMUSCULAR | Status: DC | PRN
Start: 2022-06-25 — End: 2022-06-27

## 2022-06-25 MED ORDER — DEXTROSE 5 % IV SOLN
250.0000 mL | INTRAVENOUS | Status: AC
Start: 2022-06-25 — End: 2022-06-26
  Administered 2022-06-25: 250 mL via INTRAVENOUS

## 2022-06-25 MED ORDER — SODIUM CHLORIDE (PF) 0.9 % IJ SOLN
5.0000 mL | INTRAMUSCULAR | Status: DC | PRN
Start: 2022-06-25 — End: 2022-06-27

## 2022-06-25 MED ORDER — SODIUM CHLORIDE 0.9 % IV SOLN
25.0000 mL/h | INTRAVENOUS | Status: DC | PRN
Start: 2022-06-25 — End: 2022-06-27

## 2022-06-25 MED ORDER — SODIUM CHLORIDE 0.9 % IV SOLN
250.0000 mL | INTRAVENOUS | Status: DC
Start: 2022-06-25 — End: 2022-06-27

## 2022-06-25 MED ORDER — FAMOTIDINE 10 MG/ML IV SOLN (WRAP)
20.0000 mg | Freq: Once | INTRAVENOUS | Status: DC | PRN
Start: 2022-06-25 — End: 2022-06-27
  Administered 2022-06-25: 20 mg via INTRAVENOUS
  Filled 2022-06-25: qty 2

## 2022-06-25 MED ORDER — FAMOTIDINE 10 MG/ML IV SOLN (WRAP)
20.0000 mg | Freq: Once | INTRAVENOUS | Status: DC | PRN
Start: 2022-06-25 — End: 2022-06-27

## 2022-06-25 MED ORDER — SODIUM CHLORIDE 0.9 % IV BOLUS
1000.0000 mL | Freq: Once | INTRAVENOUS | Status: AC
Start: 2022-06-25 — End: 2022-06-25
  Administered 2022-06-25: 1000 mL via INTRAVENOUS

## 2022-06-25 MED ORDER — ONDANSETRON HCL 8 MG PO TABS
8.0000 mg | ORAL_TABLET | Freq: Once | ORAL | Status: DC | PRN
Start: 2022-06-25 — End: 2022-06-27

## 2022-06-25 MED ORDER — DEXAMETHASONE IVPB 10 MG IN NS 50 ML
10.0000 mg | Freq: Once | INTRAVENOUS | Status: AC
Start: 2022-06-25 — End: 2022-06-25
  Administered 2022-06-25: 10 mg via INTRAVENOUS
  Filled 2022-06-25: qty 50

## 2022-06-25 MED ORDER — DIPHENHYDRAMINE HCL 50 MG/ML IJ SOLN
50.0000 mg | Freq: Once | INTRAMUSCULAR | Status: DC | PRN
Start: 2022-06-25 — End: 2022-06-27

## 2022-06-25 MED ORDER — DENOSUMAB 120 MG/1.7ML SC SOLN
120.0000 mg | Freq: Once | SUBCUTANEOUS | Status: AC
Start: 2022-06-25 — End: 2022-06-25
  Administered 2022-06-25: 120 mg via SUBCUTANEOUS
  Filled 2022-06-25: qty 1.7

## 2022-06-25 MED ORDER — DIPHENHYDRAMINE HCL 50 MG/ML IJ SOLN
25.0000 mg | Freq: Once | INTRAMUSCULAR | Status: AC
Start: 2022-06-25 — End: 2022-06-25
  Administered 2022-06-25: 25 mg via INTRAVENOUS
  Filled 2022-06-25: qty 1

## 2022-06-25 MED ORDER — SODIUM CHLORIDE 0.9 % IV BOLUS
500.0000 mL | Freq: Once | INTRAVENOUS | Status: DC | PRN
Start: 2022-06-25 — End: 2022-06-27

## 2022-06-25 MED ORDER — LORAZEPAM 2 MG/ML IJ SOLN
0.5000 mg | INTRAMUSCULAR | Status: DC | PRN
Start: 2022-06-25 — End: 2022-06-27

## 2022-06-25 MED ORDER — SODIUM CHLORIDE 0.9 % IV BOLUS
500.0000 mL | Freq: Once | INTRAVENOUS | Status: DC | PRN
Start: 2022-06-25 — End: 2022-06-27
  Administered 2022-06-25: 500 mL via INTRAVENOUS

## 2022-06-25 NOTE — Progress Notes (Signed)
[facility]       Date Time: June 25, 2022   Patient Name: [name] Wall,[name] Wall   Referring Provider: Algis GreenhouseAl-Mondhiry, Lache Dagher, MD    Primary Dx: Mycosis Fungoides, stage 2B      ONCOLOGY CARE TEAM  Dermatology: Russella DarAlexander Fischer, MD  Derm Oncology: Ferne ReusJennifer DeSimone, MD  Radiation Oncology:  Quentin MullingSarah Gao, MD      HISTORY OF PRESENT ILLNESS  [name] CairoMildred Twaddell is 88 y.o. female h/o HTN, HLD, CVA with chronic R-sided weakness, and Mycosis Fungoides since 2019. She was previously treated with NBUVB.  She established care here at [facility] in November 2022 when [facility] [place]. She developed tumor stage MF in Jan 2023 despite NVUBV, therefore, consensus recommendation was to treat with brentuximab.     She's here follow and initiation of palliative Doxil C1D1, in the company of her daughter. She is being seen at the wound care center at [facility] for ongoing care, with dressing changes 2x/week by home nursing. Her daughter notes she is more fatigued than prior, eating and drinking less. Plaques, lymphadenopathy and subcutaneous masses worsening, ambulating very little. No weight loss, nausea/vomiting, confusion, pain or other complaints at this time.    She continues to tolerate NVUBV once a week, 8min 15 sec per session.     She has chronic left side weakness from CVA in 2022, and R hand/wrist weakness s/p broken wrist, largely wheelchair bound. Denies any peripheral neuropathy such as numbness or tingling. She is frustrated by her lack of mobility. Daughter is concerned she had another "mini-stroke" amidst all these worsening symptoms.    No history of other skin cancers. Negative family history of skin cancer and lymphoma.     MEDICATIONS:    Current Outpatient Medications:     amLODIPine (NORVASC) 10 MG tablet, Take 1 tablet (10 mg) by mouth daily, Disp: , Rfl:     aspirin 81 MG EC tablet, Take 1 tablet (81 mg) by mouth, Disp: , Rfl:     atorvastatin (LIPITOR) 40 MG  tablet, Take 1 tablet (40 mg) by mouth every 24 hours, Disp: , Rfl:     ferrous gluconate (FERGON) 324 MG tablet, Take 1 tablet (324 mg) by mouth every morning with breakfast, Disp: 90 tablet, Rfl: 3    hydrALAZINE (APRESOLINE) 50 MG tablet, Take 1 tablet (50 mg) by mouth 3 (three) times daily with meals, Disp: , Rfl:     hydrOXYzine (ATARAX) 10 MG tablet, Take 1 tablet (10 mg) by mouth every 8 (eight) hours as needed for Itching, Disp: 30 tablet, Rfl: 1    latanoprost (XALATAN) 0.005 % ophthalmic solution, , Disp: , Rfl:     levETIRAcetam (KEPPRA) 500 MG tablet, Take 1 tablet (500 mg) by mouth 2 (two) times daily, Disp: , Rfl:     levothyroxine (SYNTHROID) 25 MCG tablet, Take 1 tablet (25 mcg) by mouth daily, Disp: , Rfl:     losartan (COZAAR) 50 MG tablet, Take 1 tablet (50 mg) by mouth daily, Disp: , Rfl:     OLANZapine (ZyPREXA) 2.5 MG tablet, Take 1 tablet (2.5 mg) by mouth nightly, Disp: 30 tablet, Rfl: 3    ALPRAZolam (XANAX) 0.25 MG tablet, Take 1 tablet (0.25 mg) by mouth daily as needed for Anxiety (Patient not taking: Reported on 06/25/2022), Disp: 30 tablet, Rfl: 0    doxycycline (VIBRA-TABS) 100 MG tablet, Take 1 tablet (100 mg) by mouth daily (Patient not taking: Reported on 06/25/2022),  Disp: 30 tablet, Rfl: 1    gentamicin (GARAMYCIN) 0.1 % ointment, APPLY UP TO TWICE A DAY ON CRUSTED AREAS UNTIL RESOLVED (Patient not taking: Reported on 06/25/2022), Disp: , Rfl:     imiquimod (ALDARA) 5 % cream, Apply nightly three times a week (Monday, Wednesday, Friday) to behind left knee and right ankle (Patient not taking: Reported on 06/25/2022), Disp: 96 each, Rfl: 2    metoclopramide (REGLAN) 5 MG tablet, Take 1 tablet (5 mg) by mouth 4 (four) times daily as needed (nausea or vomiting) (Patient not taking: Reported on 06/25/2022), Disp: 90 tablet, Rfl: 0    mirtazapine (REMERON) 7.5 MG tablet, Take 1 tablet (7.5 mg) by mouth nightly (Patient not taking: Reported on 06/25/2022), Disp: 30 tablet, Rfl: 11     ondansetron (ZOFRAN) 8 MG tablet, Take 1 tablet (8 mg) by mouth every 8 hours as needed for nausea or vomiting. (Patient not taking: Reported on 06/25/2022), Disp: 30 tablet, Rfl: 1    pantoprazole (PROTONIX) 40 MG tablet, Take 1 tablet (40 mg) by mouth daily (Patient not taking: Reported on 06/25/2022), Disp: 90 tablet, Rfl: 1    SSD 1 % cream, , Disp: , Rfl:     tacrolimus (PROTOPIC) 0.1 % ointment, Apply topically (Patient not taking: Reported on 06/25/2022), Disp: , Rfl:     triamcinolone (KENALOG) 0.1 % cream, Apply topically (Patient not taking: Reported on 06/25/2022), Disp: , Rfl:     triamcinolone (KENALOG) 0.1 % ointment, as needed (Patient not taking: Reported on 06/25/2022), Disp: , Rfl:   No current facility-administered medications for this visit.    Facility-Administered Medications Ordered in Other Visits:     0.9% NaCl infusion, 250 mL, Intravenous, Continuous, Al-Mondhiry, Aayra Hornbaker, MD    0.9% NaCl infusion, 25 mL/hr, Intravenous, PRN, Al-Mondhiry, Keishon Chavarin, MD    0.9% NaCl infusion, 250 mL, Intravenous, Continuous, Al-Mondhiry, Thomasenia Dowse, MD    0.9% NaCl infusion, 25 mL/hr, Intravenous, PRN, Al-Mondhiry, Abran Gavigan, MD    albuterol (PROVENTIL) (2.5 MG/3ML) 0.083% nebulizer solution 2.5 mg, 2.5 mg, Nebulization, Once PRN, Al-Mondhiry, Adasia Hoar, MD    albuterol (PROVENTIL) (2.5 MG/3ML) 0.083% nebulizer solution 2.5 mg, 2.5 mg, Nebulization, Once PRN, Al-Mondhiry, Jordyan Hardiman, MD    dextrose 5 % infusion 250 mL, 250 mL, Intravenous, Continuous, Al-Mondhiry, Sheriece Jefcoat, MD, Stopped at 06/25/22 1238    diphenhydrAMINE (BENADRYL) injection 25 mg, 25 mg, Intravenous, Once PRN, Al-Mondhiry, Amela Handley, MD    diphenhydrAMINE (BENADRYL) injection 25 mg, 25 mg, Intravenous, Once PRN, Al-Mondhiry, Aribella Vavra, MD    diphenhydrAMINE (BENADRYL) injection 50 mg, 50 mg, Intravenous, Once PRN, Al-Mondhiry, Brittny Spangle, MD    diphenhydrAMINE (BENADRYL) injection 50 mg, 50 mg, Intravenous, Once PRN, Al-Mondhiry, Korrine Sicard, MD    EPINEPHrine (ADRENALIN) 1 MG/ML adult  anaphylaxis kit 0.3 mg, 0.3 mg, Intramuscular, Once PRN, Al-Mondhiry, Yadier Bramhall, MD    EPINEPHrine (ADRENALIN) 1 MG/ML adult anaphylaxis kit 0.3 mg, 0.3 mg, Intramuscular, Once PRN, Al-Mondhiry, Ellene Bloodsaw, MD    famotidine (PEPCID) injection 20 mg, 20 mg, Intravenous, Once PRN, Al-Mondhiry, Chapman Matteucci, MD, 20 mg at 06/25/22 1247    famotidine (PEPCID) injection 20 mg, 20 mg, Intravenous, Once PRN, Al-Mondhiry, Emoni Yang, MD    LORazepam (ATIVAN) injection 0.5 mg, 0.5 mg, Intravenous, Q30 Min PRN, Al-Mondhiry, Kym Scannell, MD    LORazepam (ATIVAN) tablet 1 mg, 1 mg, Oral, Once PRN, Al-Mondhiry, Toshua Honsinger, MD    methylPREDNISolone sodium succinate (Solu-MEDROL) injection 40 mg, 40 mg, Intravenous, Once PRN, Al-Mondhiry, Aydan Levitz, MD    methylPREDNISolone sodium succinate (Solu-MEDROL) injection 40 mg, 40  mg, Intravenous, Once PRN, Al-Mondhiry, Vontae Court, MD    ondansetron (ZOFRAN) 8 mg in sodium chloride 0.9 % 50 mL IVPB (premix), 8 mg, Intravenous, Once PRN, Al-Mondhiry, Goebel Hellums, MD    ondansetron (ZOFRAN) tablet 8 mg, 8 mg, Oral, Once PRN, Al-Mondhiry, Yamen Castrogiovanni, MD    sodium chloride (PF) 0.9 % injection 5-20 mL, 5-20 mL, Intravenous, PRN, Al-Mondhiry, Rydge Texidor, MD    sodium chloride (PF) 0.9 % injection 5-20 mL, 5-20 mL, Intravenous, PRN, Al-Mondhiry, Marlane Hirschmann, MD    sodium chloride 0.9 % bolus 500 mL, 500 mL, Intravenous, Once PRN, Al-Mondhiry, [place] Mullett, MD, Stopped at 06/25/22 1307    sodium chloride 0.9 % bolus 500 mL, 500 mL, Intravenous, Once, Al-Mondhiry, Mihran Lebarron, MD    sodium chloride 0.9 % bolus 500 mL, 500 mL, Intravenous, Once PRN, Al-Mondhiry, Dayanara Sherrill, MD    Allergies   Allergen Reactions    Penicillins      Past Medical History:   Diagnosis Date    Hypercholesteremia     Hypertension     Seizures     Stroke      Past Surgical History:   Procedure Laterality Date    HAND, CLOSED REDUCTION  01/29/2019    right wrist     Family History   Problem Relation Age of Onset    Breast cancer Sister      Social History     Socioeconomic History    Marital status:  Widowed   Tobacco Use    Smoking status: Never    Smokeless tobacco: Never   Vaping Use    Vaping Use: Never used   Substance and Sexual Activity    Alcohol use: Not Currently    Drug use: Never    Sexual activity: Not Currently     PHYSICAL EXAM:  Vitals:    06/25/22 0842   BP: 145/62   Pulse: 88   Temp: 97.6 F (36.4 C)   SpO2: 100%     Wt Readings from Last 3 Encounters:   06/25/22 48.1 kg (106 lb)   06/08/22 48.3 kg (106 lb 6.4 oz)   06/08/22 48.3 kg (106 lb 6.4 oz)     ECOG PS: 1-2  General: NAD, seated in wheelchair, very slow in standing up or walking  Eyes: Anicteric, normal conjunctiva  ENT: No sores in mouth  CV: RRR  RESP: CTA B  GI: NT/ND, no HSM  Neuro: AAO x 3, slow/stuttering gait, R > L weakness  MS: no swelling in arms or legs  SKIN:   - Head including face - diffuse hyperpigmentation of forehead, thinning hair with receding hair line  - Right lower extremity -  multiple hyperpigmented indurated plaques on right shin, distal posterior calf and ankle, largest ~4-5cm on anterior mid shin, most 2-3cm on ankle and distal posterior calf (WORSENING, now tumor stage). Anterior mid shin and anterior/medial ankle plaques with large, deep erosions w/o surrounding erythema or tenderness (WORSENING); scattered hyperpigmented patches on right thigh  - Left lower extremity - scattered hyperpigmented patches on upper and lower leg; new erythematous patches on L anterior thigh  - Right upper extremity - single hyperpigmented patch on right forearm  - Abdomen - hyperpigmented plaque on RLQ with open erosion (WORSENING)   LYMPH: +multiple R subcutaneous (3cm + 5cm + 5.5cm) lymph node conglomerates and inguinal adenopathy (WORSENING); negative cervical suplarclavicular axillary     04/13/22            02/09/22  01/19/22            12/29/21  No significant interval change in MF  No sign of infection in right posterior ankle open wound    12/14/21              LABS:  Lab Results   Component Value Date    WBC 9.66 (H)  06/25/2022    HGB 11.6 06/25/2022    HCT 36.7 06/25/2022    MCV 80.7 06/25/2022    PLT 310 06/25/2022     Lab Results   Component Value Date    CREAT 1.2 (H) 06/25/2022    BUN 18.0 06/25/2022    NA 142 06/25/2022    K 3.6 06/25/2022    CL 100 06/25/2022    CO2 31 (H) 06/25/2022     Lab Results   Component Value Date    ALT 14 06/25/2022    AST 31 06/25/2022    ALKPHOS 83 06/25/2022    BILITOTAL 0.4 06/25/2022       CARDIAC PROCEDURES:  TTE 06/12/22  Summary    * The left ventricle is small. Sigmoid septum, mild basal septal  hypertrophy.    * There is mild concentric left ventricular hypertrophy.    * Left ventricular systolic function is normal with an ejection fraction by  Biplane Method of Discs of  66 %.    * Left ventricular segmental wall motion is normal.    * Left ventricular diastolic filling parameters are consistent with Grade I  diastolic dysfunction (impaired relaxation pattern).    * The right ventricular cavity size is normal in size.    * Normal right ventricular systolic function.    * The left atrium is normal in size.    * The right atrium is normal in size.    * The aortic valve is tricuspid. Non coronary cusp is fixed.    * There is mild aortic stenosis with a peak velocity of 2.1 m/s, mean  gradient of 10 mmHg, a dimensionless index of 0.46, and an aortic area of 1.6  cm2 by Continuity Equation.  The indexed area is 1.1 cm2/m2.    * There is mild aortic regurgitation.    * There is mild mitral regurgitation.    * There is mild tricuspid regurgitation.    * No pulmonary hypertension with estimated right ventricular systolic  pressure of  33 mmHg.    * The IVC is normal in size with > 50% respiratory variance consistent with  normal RA pressure of 3 mmHg.    * No prior study is available for comparison.      IMAGING:  PET CT WHOLE BODY 11/19/2021  FINDINGS:   Mediastinum: SUV max 2.4  Liver: SUV max 3.2     HEAD AND NECK: Normal FDG distribution within the nasopharynx, oropharynx,  larynx and small  sized thyroid gland. Limited evaluation of the brain  parenchyma demonstrates no discrete focus of increased FDG uptake. No FDG  avid cervical or supraclavicular lymph nodes. Minimal leftward deviation of  the nasal septum.     THORAX: No hypermetabolic foci within the lung parenchyma. Mildly FDG avid  subcentimeter bilateral axillary lymph nodes, likely inflammatory/reactive.  No other FDG avid thoracic lymph nodes. Coronary artery calcifications.  Aortic valvular calcifications. Small hiatal hernia. Bibasilar bandlike  linear atelectasis or scarring.     ABDOMEN AND PELVIS:   *  FDG avid right external iliac node (image 239): 0.4 x 0.7  cm with SUV  max 4.7  *  FDG avid right inguinal nodes, for example on image 243: up to 1.5 x  0.7 cm with SUV max up to 3.4     Mildly FDG avid subcentimeter left inguinal lymph nodes, likely  reactive/inflammatory.     Normal FDG distribution within the liver, spleen, mild atrophic pancreas,  and adrenal glands. Cholelithiasis. Non-FDG avid exophytic right renal  cyst, possibly containing a septation, measuring 6.0 x 4.9 cm (image 190).  Colonic diverticula, especially within the sigmoid colon, without evidence  of diverticulitis. Multiple pelvic phleboliths.     Below the knee misregistration somewhat limits evaluation.    MUSCULOSKELETAL/EXTREMITIES:   No hypermetabolic foci within the bone marrow. Multilevel spinal  degenerative changes. No aggressive lytic or sclerotic lesion. Right total  hip arthroplasty, with associated surrounding increased uptake, likely  inflammatory. Moderate right and mild left leg subcutaneous edema with mild  increased uptake, possibly inflammatory.     CUTANEOUS/SUBCUTANEOUS:   Tiny cutaneous foci of uptake in the lateral aspect of the right hemipelvis  (images 213-222), for example, on image 220: up to 0.2 cm in thickness with  SUV max up to 3.3     FDG avid subcutaneous nodules, with reference examples as follows:  *  Right inguinal/proximal right  thigh subcutaneous soft tissue nodule  (image 268): 1.9 x 2.2 cm with SUV max 6.6  *  Proximal right thigh anteromedial subcutaneous soft tissue nodule  (image 279): 2.7 x 3.6 cm with SUV max 14.7     Multiple cutaneous foci of uptake in the right lower extremity, below the  knee, with reference examples as follows:  *  Curvilinear cutaneous uptake along the anterolateral mid right leg  (image 427): 0.5 cm in thickness with SUV max 5.1  *  Cutaneous uptake within the anteromedial aspect of the mid right leg  (image 431): 0.2 cm in thickness with SUV max 5.2  *  Cutaneous uptake along the medial and posteromedial right leg (images  449-477): up to 0.4 cm in thickness with SUV max up to 7.7     Cutaneous/subcutaneous foci of uptake within the left lower extremity, as  follows:  *  Cutaneous focus of uptake within the posterior distal left thigh  (image 349): 0.3 cm in thickness with SUV max 3.5  *  At least 3 foci of cutaneous uptake lateral to the left knee (image  [phone]), for example on image 372: up to 0.4 cm in thickness with SUV max  up to 9.3  *  Cutaneous/subcutaneous focus of uptake within the mid left leg (image  425): 0.5 x 0.3 cm with SUV max 4.7     IMPRESSION:   1. FDG avid cutaneous foci of uptake within the bilateral lower  extremities, particularly at and below the knees, is consistent with  malignancy.  2. FDG avid subcutaneous nodules within the right inguinal/right proximal  thigh region is consistent with malignancy.  3. FDG avid right inguinal and right external iliac lymph nodes are  suspicious for nodal metastatic disease.    PATHOLOGY    07/14/18            Procedure Date & Time: 11/03/2021, 09:00                              FLOW CYTOMETRY REPORT          FLOW INTERPRETATION:  PERIPHERAL BLOOD: NO IMMUNOPHENOTYPIC ABNORMALITIES IDENTIFIED          COMMENT:     A population (12%) of lymphocytes is detected. About 53% of these cells     are CD3+ T cells and 30% are NK cells. Expressed  as a percentage of     total T cells, CD4+/CD7-T cells are 4.0%, and CD4+/CD26-T cells are     3.9%. The calculated CD4/CD8 ratio is about 5.7. 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 Correlation with     morphology, clinical findings and other laboratory studies is     recommended for a complete evaluation.       Assessment & Plan:   Kera Deacon is 88 y.o. FST5 female h/o HTN, HLD, CVA with chronic R-sided weakness, and Mycosis Fungoides since 2019. She recently developed new nodules and lymphadenopathy with biopsy proven advancement to tumor stage disease, Stage 2B CTCL, MF subtype. Given scattered CD30+ disease, we recommended patient to receive brentuximab, s/p 3 cycles with mixed response to date, now with clinically progressive plaques and lymph node conglomerates on the RLE.     After another long discussion about the pros/cons, particularly her difficult experience with recent chemotherapy (anorexia, nausea, weight loss, fatigue) and advanced age, will cautiously plan to restart chemotherapy given significant progression of disease, now with open ulcers all around her R ankle. We recommend treatment with Doxil.  We reviewed the dose (20mg /m3), schedule (every 3 weeks), route (IV), potential benefits and side effects (including but not limited to cytopenias, nausea/vomiting, anorexia/weight loss, cardiotoxicity). Given her advanced age and comorbidities, we will plan to re-evaluate frequently for tolerability and side effects, with a low threshold to stop for toxicity. I did explain that if/when that occurs, we would recommend transition to hospice at that point.    Stage 2B CTCL, MF subtype  - PET CT shows diffuse skin lesions and sub cutaneous masses near skin draining R inguinal nodes  - Flow, TCR, CBC without evidence of blood tumor burden; will recheck flow with the start of treatment today  - s/p Brentuximab 1.8mg /kg IV q3wks x3 doses, c/b fatigue, nausea, anorexia,  weight loss, limiting further treatment  - Systemic Treatment: Doxil 20mg /m2 IV q3wks    -- C1D1 = 06/25/22, labs reviewed, ok for C1 today    -- Nausea ppx: ondansetron 8mg  IV w/infusion; olanzapine 2.5mg  PO QHS    -- Nausea PRN: ondansetron 8mg  PO Q8hrs vs reglan 5mg  PO Q6hrs PRN    -- TTE (06/12/22, prior to tx): EF 66%, mild diastolic dysfunction  - Continue concurrent skin-directed therapy (NBUVB) once weekly with primary derm Dr. Chandra Batch  - Appreciate co-management with Dr. Bryson Ha    2. Deep erosion of plaques on right shin/ankle  - Continue wound care at [facility]  - Doxycycline 100mg  PO daily for antimicrobial ppx  - Continue topical therapies as directed by wound care + Dr. Bryson Ha    3. Anorexia, weight loss, severe protein-calorie malnutrition  - Likely 2/2 brentuximab and advanced age, improving since time off treatment  - Rx for olanzapine 2.5mg  PO QHS for appetite stimulation, never picked up d/t concerns about psychiatric effects; re-discussed today, and given that we are restarting chemotherapy they are now accepting  - Tried remeron once but found it too sedating  - Monitor for excess sedation  - Will refer to nutrition if worsening  - Advised ensure, boost, or blended drinks to improve calorie intake  4. Hypercalcemia of malignancy  - START Xgeva 120mg  SC q4wks, may hold next dose depending on response    5. Advanced Care Planning  - Given hypercalcemia, neutrophilia, low albumin, poor oral intake, AKI, we explained to patient/daughter that she is in a hyper-inflammatory state associated with a poor overall prognosis -- will monitor for improvements vs worsening in the coming weeks  - F/U on AD completion at next visit    RTC in 3wks        Lue [place] Al-Mondhiry, MD, MA  Medical Oncologist, Cutaneous Malignancies Program  Assistant Professor of Medical Education, [employer]  [facility]  [id number]  [place], [place] [address]  Phone: [phone]  Fax:  [phone]

## 2022-06-25 NOTE — Progress Notes (Signed)
ADULT INFUSION PROGRESS NOTE      Patient Name: [name] Wall,[name] Wall  Referring Physician: Dr. Darol Destine Al-Mondhiry    Assessment/Plan:   Infusion Reaction    88yo female presents to the infusion center for C1D1 Doxil. Less than 10 minutes into the infusion, she complained of throbbing low back pain. No chest pain or tightness, nausea/vomiting, rash, new/increased itching, throat constriction. BP normotensive, but lower than her baseline 135/60 >> 113/57, O2 96%. Doxil was stopped, saline infused and famotidine 20mg  IV administered. Symptoms resolved after 30 minutes and treatment resumed at half rate. Patient tolerated the remainder of infusion without issue.     Infusion Related Adverse Event Note      1.   Diagnosis: Mycosis Fungoides  Cycle: C1D1    Regimen: [facility]HS(O) - OP Adult - CTCL- Liposomal DOXOrubicin Q 21 Days  IHS(O) - OP Adult - Hypercalcemia of Malignancy - Denosumab (XGEVA)  Oncologist: Jafar Al-Mondhiry, MD    Pre-meds:  Antipyretics:  []   Acetaminophen 650 mg  Antihistamines  []   Diphenhydramine 50 mg  [x]   Diphenhydramine 25 mg  []   Famotidine 20 mg  Steroids  [x]   Dexamethasone 10 mg  []   Dexamethasone 20 mg  []   Methylprednisolone 125 mg    Infusion Adverse Event Symptoms: Sacral/tail-bone pain    Vital Sign/Physical Exam Findings: Blood pressure normotensive, but decreased from baseline 135/60 >> 113/57, O2 96%    Infusion Adverse Event Interventions: Infusion interrupted, Saline infused, and Antihistamines: Famotidine 20 mg    Type and Grade: include suspect anaphylaxis/anaphylactoid   CTCAE Grade: 2    Outcome  [x]   Symptoms fully resolved over a period of 30 minutes  [x]   Re-challenged at 1/2 infusion rate and patient tolerated remaining infusion without incident   []   Transferred to ED     Post- Adverse Event checklist   [x]   Oncology pharmacy referral placed   [x]   Message sent to MD triage board    Hypercalcemia  - Corrected serum Ca 13.7 (Alb 3.1, nl Alb 4.0)  - Dr. Dickey Gave informed,  Rivka Barbara ordered and administered     Mycosis Fungoides  - s/p Brentuximab x3 infusions (last 02/09/22)  - here today for C1D1 Doxorubicin      Subjective/HPI:   88yo female presents to the infusion center for C1D1 Doxil. Less than 10 minutes into the infusion, she complained of throbbing low back pain. No chest pain or tightness, nausea/vomiting, rash, new/increased itching, throat constriction.     Medications:     Current Outpatient Medications:     ALPRAZolam (XANAX) 0.25 MG tablet, Take 1 tablet (0.25 mg) by mouth daily as needed for Anxiety (Patient not taking: Reported on 06/25/2022), Disp: 30 tablet, Rfl: 0    amLODIPine (NORVASC) 10 MG tablet, Take 1 tablet (10 mg) by mouth daily, Disp: , Rfl:     aspirin 81 MG EC tablet, Take 1 tablet (81 mg) by mouth, Disp: , Rfl:     atorvastatin (LIPITOR) 40 MG tablet, Take 1 tablet (40 mg) by mouth every 24 hours, Disp: , Rfl:     doxycycline (VIBRA-TABS) 100 MG tablet, Take 1 tablet (100 mg) by mouth daily (Patient not taking: Reported on 06/25/2022), Disp: 30 tablet, Rfl: 1    ferrous gluconate (FERGON) 324 MG tablet, Take 1 tablet (324 mg) by mouth every morning with breakfast, Disp: 90 tablet, Rfl: 3    gentamicin (GARAMYCIN) 0.1 % ointment, APPLY UP TO TWICE A DAY ON CRUSTED AREAS UNTIL RESOLVED (Patient  not taking: Reported on 06/25/2022), Disp: , Rfl:     hydrALAZINE (APRESOLINE) 50 MG tablet, Take 1 tablet (50 mg) by mouth 3 (three) times daily with meals, Disp: , Rfl:     hydrOXYzine (ATARAX) 10 MG tablet, Take 1 tablet (10 mg) by mouth every 8 (eight) hours as needed for Itching, Disp: 30 tablet, Rfl: 1    imiquimod (ALDARA) 5 % cream, Apply nightly three times a week (Monday, Wednesday, Friday) to behind left knee and right ankle (Patient not taking: Reported on 06/25/2022), Disp: 96 each, Rfl: 2    latanoprost (XALATAN) 0.005 % ophthalmic solution, , Disp: , Rfl:     levETIRAcetam (KEPPRA) 500 MG tablet, Take 1 tablet (500 mg) by mouth 2 (two) times daily, Disp: ,  Rfl:     levothyroxine (SYNTHROID) 25 MCG tablet, Take 1 tablet (25 mcg) by mouth daily, Disp: , Rfl:     losartan (COZAAR) 50 MG tablet, Take 1 tablet (50 mg) by mouth daily, Disp: , Rfl:     metoclopramide (REGLAN) 5 MG tablet, Take 1 tablet (5 mg) by mouth 4 (four) times daily as needed (nausea or vomiting) (Patient not taking: Reported on 06/25/2022), Disp: 90 tablet, Rfl: 0    mirtazapine (REMERON) 7.5 MG tablet, Take 1 tablet (7.5 mg) by mouth nightly (Patient not taking: Reported on 06/25/2022), Disp: 30 tablet, Rfl: 11    OLANZapine (ZyPREXA) 2.5 MG tablet, Take 1 tablet (2.5 mg) by mouth nightly, Disp: 30 tablet, Rfl: 3    ondansetron (ZOFRAN) 8 MG tablet, Take 1 tablet (8 mg) by mouth every 8 hours as needed for nausea or vomiting. (Patient not taking: Reported on 06/25/2022), Disp: 30 tablet, Rfl: 1    pantoprazole (PROTONIX) 40 MG tablet, Take 1 tablet (40 mg) by mouth daily (Patient not taking: Reported on 06/25/2022), Disp: 90 tablet, Rfl: 1    SSD 1 % cream, , Disp: , Rfl:     tacrolimus (PROTOPIC) 0.1 % ointment, Apply topically (Patient not taking: Reported on 06/25/2022), Disp: , Rfl:     triamcinolone (KENALOG) 0.1 % cream, Apply topically (Patient not taking: Reported on 06/25/2022), Disp: , Rfl:     triamcinolone (KENALOG) 0.1 % ointment, as needed (Patient not taking: Reported on 06/25/2022), Disp: , Rfl:   No current facility-administered medications for this visit.    Facility-Administered Medications Ordered in Other Visits:     0.9% NaCl infusion, 250 mL, Intravenous, Continuous, Al-Mondhiry, Jafar, MD    0.9% NaCl infusion, 25 mL/hr, Intravenous, PRN, Al-Mondhiry, Jafar, MD    albuterol (PROVENTIL) (2.5 MG/3ML) 0.083% nebulizer solution 2.5 mg, 2.5 mg, Nebulization, Once PRN, Al-Mondhiry, Jafar, MD    dextrose 5 % infusion 250 mL, 250 mL, Intravenous, Continuous, Al-Mondhiry, Jafar, MD, Stopped at 06/25/22 1238    diphenhydrAMINE (BENADRYL) injection 25 mg, 25 mg, Intravenous, Once PRN,  Al-Mondhiry, Jafar, MD    diphenhydrAMINE (BENADRYL) injection 50 mg, 50 mg, Intravenous, Once PRN, Al-Mondhiry, Jafar, MD    DOXOrubicin LIPOSOME (DOXIL) 29 mg in dextrose 5 % 299.5 mL chemo infusion, 20 mg/m2 (Treatment Plan Recorded), Intravenous, Once, Al-Mondhiry, Jafar, MD, Paused at 06/25/22 1238    EPINEPHrine (ADRENALIN) 1 MG/ML adult anaphylaxis kit 0.3 mg, 0.3 mg, Intramuscular, Once PRN, Al-Mondhiry, Jafar, MD    famotidine (PEPCID) injection 20 mg, 20 mg, Intravenous, Once PRN, Al-Mondhiry, Jafar, MD, 20 mg at 06/25/22 1247    LORazepam (ATIVAN) injection 0.5 mg, 0.5 mg, Intravenous, Q30 Min PRN, Al-Mondhiry, Jafar, MD  LORazepam (ATIVAN) tablet 1 mg, 1 mg, Oral, Once PRN, Al-Mondhiry, Jafar, MD    methylPREDNISolone sodium succinate (Solu-MEDROL) injection 40 mg, 40 mg, Intravenous, Once PRN, Al-Mondhiry, Jafar, MD    sodium chloride (PF) 0.9 % injection 5-20 mL, 5-20 mL, Intravenous, PRN, Al-Mondhiry, Jafar, MD    sodium chloride 0.9 % bolus 500 mL, 500 mL, Intravenous, Once PRN, Al-Mondhiry, Jafar, MD, Last Rate: 500 mL/hr at 06/25/22 1243, 500 mL at 06/25/22 1243      Physical Exam:       Physical Exam  Vitals reviewed.   Constitutional:       Comments: Frail-appearing   HENT:      Head: Normocephalic.   Cardiovascular:      Rate and Rhythm: Normal rate.   Pulmonary:      Effort: Pulmonary effort is normal. No respiratory distress.      Breath sounds: No wheezing.   Abdominal:      General: Abdomen is flat.   Musculoskeletal:      Cervical back: Normal range of motion.   Skin:     General: Skin is warm and dry.   Neurological:      General: No focal deficit present.      Mental Status: She is alert and oriented to person, place, and time. Mental status is at baseline.       Labs:     Appointment on 06/25/2022   Component Date Value Ref Range Status    WBC 06/25/2022 9.66 (H)  3.10 - 9.50 x10 3/uL Final    Hgb 06/25/2022 11.6  11.4 - 14.8 g/dL Final    Hematocrit 16/10/960409/29/2023 36.7  34.7 - 43.7 %  Final    Platelets 06/25/2022 310  142 - 346 x10 3/uL Final    RBC 06/25/2022 4.55  3.90 - 5.10 x10 6/uL Final    MCV 06/25/2022 80.7  78.0 - 96.0 fL Final    MCH 06/25/2022 25.5  25.1 - 33.5 pg Final    MCHC 06/25/2022 31.6  31.5 - 35.8 g/dL Final    RDW 54/09/811909/29/2023 14  11 - 15 % Final    MPV 06/25/2022 9.5  8.9 - 12.5 fL Final    Instrument Absolute Neutrophil Cou* 06/25/2022 8.14 (H)  1.10 - 6.33 x10 3/uL Final    Neutrophils 06/25/2022 84.3  None % Final    Lymphocytes Automated 06/25/2022 5.8  None % Final    Monocytes 06/25/2022 8.0  None % Final    Eosinophils Automated 06/25/2022 1.3  None % Final    Basophils Automated 06/25/2022 0.3  None % Final    Immature Granulocytes 06/25/2022 0.3  None % Final    Nucleated RBC 06/25/2022 0.0  0.0 - 0.0 /100 WBC Final    Neutrophils Absolute 06/25/2022 8.14 (H)  1.10 - 6.33 x10 3/uL Final    Lymphocytes Absolute Automated 06/25/2022 0.56  0.42 - 3.22 x10 3/uL Final    Monocytes Absolute Automated 06/25/2022 0.77  0.21 - 0.85 x10 3/uL Final    Eosinophils Absolute Automated 06/25/2022 0.13  0.00 - 0.44 x10 3/uL Final    Basophils Absolute Automated 06/25/2022 0.03  0.00 - 0.08 x10 3/uL Final    Immature Granulocytes Absolute 06/25/2022 0.03  0.00 - 0.07 x10 3/uL Final    Absolute NRBC 06/25/2022 0.00  0.00 - 0.00 x10 3/uL Final    Glucose 06/25/2022 164 (H)  70 - 100 mg/dL Final    BUN 14/78/295609/29/2023 18.0  7.0 - 21.0 mg/dL  Final    Creatinine 06/25/2022 1.2 (H)  0.4 - 1.0 mg/dL Final    Sodium 22/97/9892 142  135 - 145 mEq/L Final    Potassium 06/25/2022 3.6  3.5 - 5.3 mEq/L Final    Chloride 06/25/2022 100  99 - 111 mEq/L Final    CO2 06/25/2022 31 (H)  17 - 29 mEq/L Final    Calcium 06/25/2022 13.0 (H)  7.9 - 10.2 mg/dL Final    Protein, Total 06/25/2022 8.6 (H)  6.0 - 8.3 g/dL Final    Albumin 11/94/1740 3.1 (L)  3.5 - 5.0 g/dL Final    AST (SGOT) 81/44/8185 31  5 - 41 U/L Final    ALT 06/25/2022 14  0 - 55 U/L Final    Alkaline Phosphatase 06/25/2022 83  37 - 117 U/L  Final    Bilirubin, Total 06/25/2022 0.4  0.2 - 1.2 mg/dL Final    Globulin 63/14/9702 5.5 (H)  2.0 - 3.6 g/dL Final    Albumin/Globulin Ratio 06/25/2022 0.6 (L)  0.9 - 2.2 Final    Anion Gap 06/25/2022 11.0  5.0 - 15.0 Final    eGFR 06/25/2022 41.5 (A)  >=60 mL/min/1.73 m2 Final    Flow Cytometry 06/25/2022 See Path Report   Final    Flow Cytometry Leukemia Lymphoma P* 06/25/2022 Unknown   Final    Flow Cytometry Clinical Hx 06/25/2022 DR DESIMONE CTC   Final    Hemolysis Index 06/25/2022 10  0 - 24 Index Final         Signed by: Tomasita Morrow, PA  Advanced Practice Provider      [facility]  [address]  [place] [address]

## 2022-06-25 NOTE — Progress Notes (Unsigned)
[name]   Visit Date: 06/25/2022        [name] Wall is a 88 y.o. female patient of Jafar Al-Mondhiry, MD    Since her last visit, she has been doing fair. Patient denies fever, chills, SOB. Labs: calcium: 13.0, Creatinine: 1.2, WBC: 9.66.  Dr. Dickey Gave notified. Patient cleared for treatment. LVEF: 66%    %Patient presents to the infusion center for Cycle 1, Day 1.   Overall, she states her poor, unable to carry out most of his/her usual activities. Patient is hard of hearing. She reports decreased appetite with an associated with no weight loss noted. She reports constipation d/t not drinking enough and subsequently feels dehydrated and doesn't as much. Lesions and swelling noted to right leg, abdomen and knee. Seh reoports itching to right leg, abdomen, back and arms.  There are no social work or other referral needs at this time.    Diagnosis:  Mycosis Fungoides fo lymph nodes.   Treatment on Clinical Trial: Not On Study (NOS)  IHS(O) - OP Adult - CTCL- Liposomal DOXOrubicin Q 21 Days  IHS(O) - OP Adult - Hypercalcemia of Malignancy - Denosumab (XGEVA)      IV Pre Hydration: Completed (NSS 1000 L)    Pre-Medications: dexamethasone (Decadron) 10 mg, diphenhydramine (Benadryl) 25 mg, and ondansetron (Zofran) 8 mg    Line Type: Peripheral IV:  Right Hand  Blood Return verified prior to administration: Yes, BBR      Cryotherapy: No        Vital Signs:  Patient Vitals for the past 12 hrs:   BP Temp Pulse Resp   06/25/22 1606 135/66 -- 90 --   06/25/22 1521 134/69 -- 89 --   06/25/22 1420 126/62 97.7 F (36.5 C) 85 --   06/25/22 1303 135/60 -- -- 20   06/25/22 1258 138/64 -- 91 18   06/25/22 1248 117/55 -- 85 --   06/25/22 1238 113/57 -- 85 --   06/25/22 1226 152/83 -- 85 --   06/25/22 0932 140/76 97.5 F (36.4 C) 83 --      There is no height or weight on file to calculate BSA.    '  Chemistry        Component Value Date/Time    NA 142 06/25/2022 0800    K 3.6  06/25/2022 0800    CL 100 06/25/2022 0800    CO2 31 (H) 06/25/2022 0800    BUN 18.0 06/25/2022 0800    CREAT 1.2 (H) 06/25/2022 0800    GLU 164 (H) 06/25/2022 0800        Component Value Date/Time    CA 13.0 (H) 06/25/2022 0800    ALKPHOS 83 06/25/2022 0800    AST 31 06/25/2022 0800    ALT 14 06/25/2022 0800    BILITOTAL 0.4 06/25/2022 0800            Lab Results   Component Value Date    WBC 9.66 (H) 06/25/2022    HGB 11.6 06/25/2022    PLT 310 06/25/2022    NEUTROABS 8.14 (H) 06/25/2022       Administrations This Visit       dexAMETHasone (DECADRON) 10 mg in sodium chloride 0.9 % 50 mL IVPB       Admin Date  06/25/2022  11:31 Action  New Bag Dose  10 mg Rate  200 mL/hr Route  Intravenous Ordering Provider  Al-Mondhiry, Darol Destine, MD  dextrose 5 % infusion 250 mL       Admin Date  06/25/2022  12:15 Action  New Bag Dose  250 mL Rate  20 mL/hr Route  Intravenous Ordering Provider  Al-Mondhiry, Jafar, MD              diphenhydrAMINE (BENADRYL) injection 25 mg       Admin Date  06/25/2022  11:25 Action  Given Dose  25 mg Route  Intravenous Ordering Provider  Al-Mondhiry, Jafar, MD              DOXOrubicin LIPOSOME (DOXIL) 29 mg in dextrose 5 % 299.5 mL chemo infusion       Admin Date  06/25/2022  12:27 Action  New Bag Dose  29 mg Rate  299.5 mL/hr Route  Intravenous Ordering Provider  Al-Mondhiry, Jafar, MD              famotidine (PEPCID) injection 20 mg       Admin Date  06/25/2022  12:47 Action  Given Dose  20 mg Route  Intravenous Ordering Provider  Al-Mondhiry, Jafar, MD              ondansetron [facility]) injection 8 mg       Admin Date  06/25/2022  11:20 Action  Given Dose  8 mg Route  Intravenous Ordering Provider  Al-Mondhiry, Jafar, MD              sodium chloride 0.9 % bolus 1,000 mL       Admin Date  06/25/2022  11:19 Action  New Bag Dose  1,000 mL Rate  999 mL/hr Route  Intravenous Ordering Provider  Al-Mondhiry, Jafar, MD              sodium chloride 0.9 % bolus 500 mL       Admin Date  06/25/2022   12:43 Action  New Bag Dose  500 mL Rate  500 mL/hr Route  Intravenous Ordering Provider  Al-Mondhiry, Jafar, MD                    Chemo Given within the past 12 hours:   "Chemo/Biotherapy Given" (last 12 hours)       Date/Time Action User Medication Dose Rate Dose Volume (mL) Rate    06/25/22 1607 Stopped    RL DOXOrubicin LIPOSOME (DOXIL) 29 mg in dextrose 5 % 299.5 mL chemo infusion  0 mL/hr *0 mg         06/25/22 1238      *0 mg         06/25/22 1227 New Bag    AT DOXOrubicin LIPOSOME (DOXIL) 29 mg in dextrose 5 % 299.5 mL chemo infusion 29 mg 299.5 mL/hr *29 mg               Xgeva given sub Q to left upper arm and education given.    Hypersensitivity Reaction Noted: Yes. Patient reported low back pain/spasm-Doxil paused Rocelle Ermitiano, PA at bedside to assess. NSS 500 ml and Pepcid 20 mg administered per order. VSS. Patient reports back pain/spsam resolved. Infusion re-started at decreased rate of 149.7 ml/hr ordered per Illa Level, PA    Treatment Outcome:Patient tolerated remainder of treatment well with not further reactions or complaints.  Blood return verified after administration: Yes   Central Line flushed and deacessed.    Education: Patient/Family educated regarding the expected outcomes/side effects possible interactions of treatments. Patient encouratge to report any new or worsening symptoms  to team.   Patient verbalizes understanding.    Follow-up Plan: Discharged in stable condition. Accompanied by, daughter. Instructed to contact physician if any complications or unmanageable symptoms occur.   Request for appointments for 07/16/2022, 11/102023 and 08/27/2022 sent to scheduling.      Johnell Comings, RN, BSN    06/25/2022  6:21 PM

## 2022-06-25 NOTE — Telephone Encounter (Signed)
Per Dan Humphreys, daughter was confused about patient's upcoming scheduled.  Met with daughter and patient at infusion bay.  Explained that C2 infusion was moved back to Tuesday's as both Dr. Dickey Gave and Dr. Bryson Ha have clinics on Tuesday but Dr. Bryson Ha does not have clinic on Friday's.  Will keep the 07/06/22 appointment with Dr. Bryson Ha as she has no availability on 07/20/22 but moving forward will coordinate dual visits with Q21 day infusions.  Daughter verbalized understanding of information provided.

## 2022-06-26 ENCOUNTER — Encounter: Payer: Self-pay | Admitting: Student in an Organized Health Care Education/Training Program

## 2022-06-27 ENCOUNTER — Encounter: Payer: Self-pay | Admitting: Student in an Organized Health Care Education/Training Program

## 2022-06-28 ENCOUNTER — Telehealth: Payer: Self-pay

## 2022-06-28 NOTE — Telephone Encounter (Signed)
Patient's daughter, [name], called. The patient was admitted to [facility] on Sunday d/t possible stroke. [name] also reports the patient was febrile, 101.4, and had red blotches/swelling all over her body.   Ruled out stroke but doing further testing and patient receiving antibiotics. Wondering if symptoms are disease related or infusion related.     [name] is concerned that the patient is not in [place]. She wants Dr. Dickey Gave and Dr. Bryson Ha to be on top of this and questioned if advocating for transfer was recommended.     Dr. Dickey Gave notified. [name] will discuss initiating transfer further with Hospitalist at [facility]. Gayle to call back with updates.

## 2022-06-29 LAB — LAB USE ONLY - HISTORICAL NON-GYN MEDICAL CYTOLOGY

## 2022-07-01 ENCOUNTER — Ambulatory Visit: Payer: Medicare Other

## 2022-07-02 ENCOUNTER — Telehealth: Payer: Self-pay | Admitting: Student in an Organized Health Care Education/Training Program

## 2022-07-02 DIAGNOSIS — C8405 Mycosis fungoides, lymph nodes of inguinal region and lower limb: Secondary | ICD-10-CM

## 2022-07-02 NOTE — Telephone Encounter (Signed)
Called and spoke with patient's daughter [name] regarding patients recent hospitalization at [facility] over the weekend for acute weakness, new rash, fever >101, leukocytosis and lip swelling, no localizing signs/symptoms of infection, so felt to be an acute drug reaction to the Doxil infusion 9/29. This occurred despite benadryl 25mg  + dex 10mg  IV premedications. Patient is recovering well, all abx stopped, rash improving, fever abated, but still weak, and thus being discharged to a SAR today.    Given acute, severe drug reaction to Doxil despite adequate premedication, we discussed that rechallenge at this time would be prohibitively dangerous. Indeed, we acknowledged that any amount of additional treatment may be prohibitively toxic for her given her age and frailty. We recommend patient to start gemcitabine. We reviewed the dose (1000mg /m2), schedule (once weekly for 2 weeks then one week break for 3-6 cycles), route (IV), potential benefits (60-70% response rate) and side effects (decreased blood counts including platelets, nausea, vomiting, fatigue, flu like symptoms, shortness of breath, hair loss, etc). We tentatively agreed to try gemcitabine at this lower dose and frequency than typical treatment, with low threshold to stop and refer to hospice if unacceptable toxicities develop. Consent signed today.    We agreed that if she is becoming weak and fatigued to the point that she is unable to walk and loses her independence (which happened in this case), this would be an intolerable cost that would force [place] to stop therapy. Such effects are often cumulative, so may occur later if not immediately.    Plan for dual visit and restarting systemic treatment with gemcitabine on 07/20/22. We discussed that we may have to delay treatment if she is still in SAR at that time, as chemotherapy treatments will not be covered while the patient is admitted to SAR. [name] expressed understanding and appreciation for the call, and  will keep [place] informed.    Total time:      Mazzie Brodrick Al-Mondhiry, MD, MA  Medical Oncologist, Cutaneous Malignancies Program  Assistant Professor of Medical Education, [employer]  [facility]  [id number]  [place], [place] [address]  Phone: [phone]  Fax: [phone]

## 2022-07-06 ENCOUNTER — Telehealth: Payer: Self-pay

## 2022-07-06 ENCOUNTER — Ambulatory Visit: Payer: Medicare Other | Admitting: Dermatology

## 2022-07-06 ENCOUNTER — Ambulatory Visit: Payer: Medicare Other | Admitting: Student in an Organized Health Care Education/Training Program

## 2022-07-06 NOTE — Telephone Encounter (Signed)
Daughter, [name], called into triage line stating that [facility] is requesting orders for Wound Care--to be done offsite.  [name] provided phone number of [phone].    RN attempted to call [phone].  There was no pick up and unable to leave voicemail.   RN attempted to call [employer] and assess nurses station through Designer, television/film set.  There was no pick up and unable to leave voicemail.   RN will re-attempt at a later time.

## 2022-07-07 NOTE — Telephone Encounter (Signed)
Called [employer], and successfully connected with staff.  Provided fax number of [phone].

## 2022-07-08 LAB — BASIC METABOLIC PANEL
Anion Gap: 9 (ref 5.0–15.0)
BUN: 12 mg/dL (ref 7.0–21.0)
CO2: 22 mEq/L (ref 17–29)
Calcium: 7.8 mg/dL — ABNORMAL LOW (ref 7.9–10.2)
Chloride: 103 mEq/L (ref 99–111)
Creatinine: 0.7 mg/dL (ref 0.4–1.0)
Glucose: 73 mg/dL (ref 70–100)
Potassium: 3.9 mEq/L (ref 3.5–5.3)
Sodium: 134 mEq/L — ABNORMAL LOW (ref 135–145)
eGFR: >60 mL/min/{1.73_m2} (ref >=60)

## 2022-07-08 LAB — CBC
Absolute NRBC: 0 10*3/uL (ref 0.00–0.00)
Hematocrit: 25.1 % — ABNORMAL LOW (ref 34.7–43.7)
Hgb: 8 g/dL — ABNORMAL LOW (ref 11.4–14.8)
MCH: 25.1 pg (ref 25.1–33.5)
MCHC: 31.9 g/dL (ref 31.5–35.8)
MCV: 78.7 fL (ref 78.0–96.0)
MPV: 10.2 fL (ref 8.9–12.5)
Nucleated RBC: 0 /100 WBC (ref 0.0–0.0)
Platelets: 303 10*3/uL (ref 142–346)
RBC: 3.19 10*6/uL — ABNORMAL LOW (ref 3.90–5.10)
RDW: 15 % (ref 11–15)
WBC: 5.65 10*3/uL (ref 3.10–9.50)

## 2022-07-08 LAB — PREALBUMIN: Prealbumin: 11.9 mg/dL — ABNORMAL LOW (ref 16.0–38.0)

## 2022-07-08 LAB — HEMOLYSIS INDEX(SOFT): Hemolysis Index: 0 Index (ref 0–24)

## 2022-07-09 NOTE — Progress Notes (Signed)
If xgeva is needed on 07/20/22 plan to infusion during Gemcitabine treatment. Staff message sent to infusion scheduling asking for added time to Gemcitabine infusion appointment to allow for xgeva.     Task completed

## 2022-07-15 ENCOUNTER — Telehealth: Payer: Self-pay

## 2022-07-15 LAB — CBC
Absolute NRBC: 0 10*3/uL (ref 0.00–0.00)
Hematocrit: 25.9 % — ABNORMAL LOW (ref 34.7–43.7)
Hgb: 8.5 g/dL — ABNORMAL LOW (ref 11.4–14.8)
MCH: 25.3 pg (ref 25.1–33.5)
MCHC: 32.8 g/dL (ref 31.5–35.8)
MCV: 77.1 fL — ABNORMAL LOW (ref 78.0–96.0)
MPV: 9.7 fL (ref 8.9–12.5)
Nucleated RBC: 0 /100 WBC (ref 0.0–0.0)
Platelets: 500 10*3/uL — ABNORMAL HIGH (ref 142–346)
RBC: 3.36 10*6/uL — ABNORMAL LOW (ref 3.90–5.10)
RDW: 15 % (ref 11–15)
WBC: 7.5 10*3/uL (ref 3.10–9.50)

## 2022-07-15 LAB — BASIC METABOLIC PANEL
Anion Gap: 6 (ref 5.0–15.0)
BUN: 15 mg/dL (ref 7.0–21.0)
CO2: 22 mEq/L (ref 17–29)
Calcium: 7.7 mg/dL — ABNORMAL LOW (ref 7.9–10.2)
Chloride: 102 mEq/L (ref 99–111)
Creatinine: 0.7 mg/dL (ref 0.4–1.0)
Glucose: 86 mg/dL (ref 70–100)
Sodium: 130 mEq/L — ABNORMAL LOW (ref 135–145)
eGFR: >60 mL/min/{1.73_m2} (ref >=60)

## 2022-07-15 LAB — HEMOLYSIS INDEX: Hemolysis Index: 191 Index — ABNORMAL HIGH (ref 0–24)

## 2022-07-15 NOTE — Telephone Encounter (Signed)
Patient is still in rehab, unable to walk or stand on her own. They are changing dressing tomorrow, will take photos during change to send for updates. Her daughter, [name], explains she and the patient wish to speak with Dr. Dickey Gave tomorrow via telephone visit to discuss plan of care. [name] asks, "if it were his mother in these conditions would the suggestion still be to continue treatment."     Dr. Dickey Gave is aware and advised scheduling telephone visit at 1 PM tomorrow 07/15/22. Research officer, political party to schedule.

## 2022-07-16 ENCOUNTER — Encounter: Payer: Self-pay | Admitting: Student in an Organized Health Care Education/Training Program

## 2022-07-16 ENCOUNTER — Other Ambulatory Visit: Payer: Medicare Other

## 2022-07-16 ENCOUNTER — Ambulatory Visit
Payer: Medicare Other | Attending: Student in an Organized Health Care Education/Training Program | Admitting: Student in an Organized Health Care Education/Training Program

## 2022-07-16 ENCOUNTER — Ambulatory Visit: Payer: Medicare Other

## 2022-07-16 DIAGNOSIS — C8405 Mycosis fungoides, lymph nodes of inguinal region and lower limb: Secondary | ICD-10-CM

## 2022-07-17 LAB — CBC
Absolute NRBC: 0 10*3/uL (ref 0.00–0.00)
Hematocrit: 23.7 % — ABNORMAL LOW (ref 34.7–43.7)
Hgb: 7.7 g/dL — ABNORMAL LOW (ref 11.4–14.8)
MCH: 25.3 pg (ref 25.1–33.5)
MCHC: 32.5 g/dL (ref 31.5–35.8)
MCV: 78 fL (ref 78.0–96.0)
MPV: 10.7 fL (ref 8.9–12.5)
Nucleated RBC: 0 /100 WBC (ref 0.0–0.0)
Platelets: 428 10*3/uL — ABNORMAL HIGH (ref 142–346)
RBC: 3.04 10*6/uL — ABNORMAL LOW (ref 3.90–5.10)
RDW: 15 % (ref 11–15)
WBC: 8.45 10*3/uL (ref 3.10–9.50)

## 2022-07-19 LAB — CBC
Absolute NRBC: 0 10*3/uL (ref 0.00–0.00)
Hematocrit: 26.6 % — ABNORMAL LOW (ref 34.7–43.7)
Hgb: 8.4 g/dL — ABNORMAL LOW (ref 11.4–14.8)
MCH: 25.1 pg (ref 25.1–33.5)
MCHC: 31.6 g/dL (ref 31.5–35.8)
MCV: 79.6 fL (ref 78.0–96.0)
MPV: 10 fL (ref 8.9–12.5)
Nucleated RBC: 0 /100 WBC (ref 0.0–0.0)
Platelets: 477 10*3/uL — ABNORMAL HIGH (ref 142–346)
RBC: 3.34 10*6/uL — ABNORMAL LOW (ref 3.90–5.10)
RDW: 15 % (ref 11–15)
WBC: 10.13 10*3/uL — ABNORMAL HIGH (ref 3.10–9.50)

## 2022-07-19 LAB — HEMOLYSIS INDEX: Hemolysis Index: 9 Index (ref 0–24)

## 2022-07-19 LAB — BASIC METABOLIC PANEL
Anion Gap: 8 (ref 5.0–15.0)
BUN: 13 mg/dL (ref 7.0–21.0)
CO2: 24 mEq/L (ref 17–29)
Calcium: 8.1 mg/dL (ref 7.9–10.2)
Chloride: 104 mEq/L (ref 99–111)
Creatinine: 0.6 mg/dL (ref 0.4–1.0)
Glucose: 82 mg/dL (ref 70–100)
Potassium: 4.6 mEq/L (ref 3.5–5.3)
Sodium: 136 mEq/L (ref 135–145)
eGFR: >60 mL/min/{1.73_m2} (ref >=60)

## 2022-07-20 ENCOUNTER — Telehealth: Payer: Self-pay

## 2022-07-20 ENCOUNTER — Other Ambulatory Visit: Payer: Medicare Other

## 2022-07-20 ENCOUNTER — Other Ambulatory Visit: Payer: Self-pay

## 2022-07-20 ENCOUNTER — Ambulatory Visit: Payer: Medicare Other

## 2022-07-20 ENCOUNTER — Ambulatory Visit (INDEPENDENT_AMBULATORY_CARE_PROVIDER_SITE_OTHER): Payer: Medicare Other | Admitting: Dermatology

## 2022-07-20 ENCOUNTER — Encounter: Payer: Self-pay | Admitting: Dermatology

## 2022-07-20 ENCOUNTER — Ambulatory Visit
Payer: Medicare Other | Attending: Student in an Organized Health Care Education/Training Program | Admitting: Student in an Organized Health Care Education/Training Program

## 2022-07-20 ENCOUNTER — Other Ambulatory Visit (FREE_STANDING_LABORATORY_FACILITY): Payer: Medicare Other

## 2022-07-20 VITALS — BP 96/58 | HR 104 | Temp 97.4°F | Resp 16 | Ht 60.0 in

## 2022-07-20 DIAGNOSIS — C8405 Mycosis fungoides, lymph nodes of inguinal region and lower limb: Secondary | ICD-10-CM

## 2022-07-20 DIAGNOSIS — E43 Unspecified severe protein-calorie malnutrition: Secondary | ICD-10-CM

## 2022-07-20 DIAGNOSIS — Z8673 Personal history of transient ischemic attack (TIA), and cerebral infarction without residual deficits: Secondary | ICD-10-CM | POA: Insufficient documentation

## 2022-07-20 DIAGNOSIS — S71101A Unspecified open wound, right thigh, initial encounter: Secondary | ICD-10-CM | POA: Insufficient documentation

## 2022-07-20 DIAGNOSIS — T148XXA Other injury of unspecified body region, initial encounter: Secondary | ICD-10-CM

## 2022-07-20 DIAGNOSIS — M6281 Muscle weakness (generalized): Secondary | ICD-10-CM | POA: Insufficient documentation

## 2022-07-20 DIAGNOSIS — I69354 Hemiplegia and hemiparesis following cerebral infarction affecting left non-dominant side: Secondary | ICD-10-CM

## 2022-07-20 DIAGNOSIS — R2689 Other abnormalities of gait and mobility: Secondary | ICD-10-CM

## 2022-07-20 DIAGNOSIS — S81801A Unspecified open wound, right lower leg, initial encounter: Secondary | ICD-10-CM

## 2022-07-20 DIAGNOSIS — I10 Essential (primary) hypertension: Secondary | ICD-10-CM | POA: Insufficient documentation

## 2022-07-20 DIAGNOSIS — Z7189 Other specified counseling: Secondary | ICD-10-CM | POA: Insufficient documentation

## 2022-07-20 LAB — COMPREHENSIVE METABOLIC PANEL
ALT: 40 U/L (ref 0–55)
AST (SGOT): 44 U/L — ABNORMAL HIGH (ref 5–41)
Albumin/Globulin Ratio: 0.5 — ABNORMAL LOW (ref 0.9–2.2)
Albumin: 2.4 g/dL — ABNORMAL LOW (ref 3.5–5.0)
Alkaline Phosphatase: 67 U/L (ref 37–117)
Anion Gap: 8 (ref 5.0–15.0)
BUN: 13 mg/dL (ref 7.0–21.0)
Bilirubin, Total: 0.4 mg/dL (ref 0.2–1.2)
CO2: 27 mEq/L (ref 17–29)
Calcium: 9 mg/dL (ref 7.9–10.2)
Chloride: 101 mEq/L (ref 99–111)
Creatinine: 0.8 mg/dL (ref 0.4–1.0)
Globulin: 5.3 g/dL — ABNORMAL HIGH (ref 2.0–3.6)
Glucose: 112 mg/dL — ABNORMAL HIGH (ref 70–100)
Potassium: 4.5 mEq/L (ref 3.5–5.3)
Protein, Total: 7.7 g/dL (ref 6.0–8.3)
Sodium: 136 mEq/L (ref 135–145)
eGFR: >60 mL/min/{1.73_m2} (ref >=60)

## 2022-07-20 LAB — CBC AND DIFFERENTIAL
Absolute NRBC: 0 10*3/uL (ref 0.00–0.00)
Basophils Absolute Automated: 0.06 10*3/uL (ref 0.00–0.08)
Basophils Automated: 0.4 %
Eosinophils Absolute Automated: 0.19 10*3/uL (ref 0.00–0.44)
Eosinophils Automated: 1.4 %
Hematocrit: 28.2 % — ABNORMAL LOW (ref 34.7–43.7)
Hgb: 9 g/dL — ABNORMAL LOW (ref 11.4–14.8)
Immature Granulocytes Absolute: 0.15 10*3/uL — ABNORMAL HIGH (ref 0.00–0.07)
Immature Granulocytes: 1.1 %
Instrument Absolute Neutrophil Count: 11.48 10*3/uL — ABNORMAL HIGH (ref 1.10–6.33)
Lymphocytes Absolute Automated: 0.79 10*3/uL (ref 0.42–3.22)
Lymphocytes Automated: 5.7 %
MCH: 24.8 pg — ABNORMAL LOW (ref 25.1–33.5)
MCHC: 31.9 g/dL (ref 31.5–35.8)
MCV: 77.7 fL — ABNORMAL LOW (ref 78.0–96.0)
MPV: 8.3 fL — ABNORMAL LOW (ref 8.9–12.5)
Monocytes Absolute Automated: 1.13 10*3/uL — ABNORMAL HIGH (ref 0.21–0.85)
Monocytes: 8.2 %
Neutrophils Absolute: 11.48 10*3/uL — ABNORMAL HIGH (ref 1.10–6.33)
Neutrophils: 83.2 %
Nucleated RBC: 0 /100 WBC (ref 0.0–0.0)
Platelets: 506 10*3/uL — ABNORMAL HIGH (ref 142–346)
RBC: 3.63 10*6/uL — ABNORMAL LOW (ref 3.90–5.10)
RDW: 15 % (ref 11–15)
WBC: 13.8 10*3/uL — ABNORMAL HIGH (ref 3.10–9.50)

## 2022-07-20 LAB — CBC
Absolute NRBC: 0 10*3/uL (ref 0.00–0.00)
Hematocrit: 26.4 % — ABNORMAL LOW (ref 34.7–43.7)
Hgb: 8.5 g/dL — ABNORMAL LOW (ref 11.4–14.8)
MCH: 25.4 pg (ref 25.1–33.5)
MCHC: 32.2 g/dL (ref 31.5–35.8)
MCV: 78.8 fL (ref 78.0–96.0)
MPV: 10 fL (ref 8.9–12.5)
Nucleated RBC: 0 /100 WBC (ref 0.0–0.0)
Platelets: 486 10*3/uL — ABNORMAL HIGH (ref 142–346)
RBC: 3.35 10*6/uL — ABNORMAL LOW (ref 3.90–5.10)
RDW: 15 % (ref 11–15)
WBC: 12.33 10*3/uL — ABNORMAL HIGH (ref 3.10–9.50)

## 2022-07-20 LAB — HEMOLYSIS INDEX: Hemolysis Index: 1 Index (ref 0–24)

## 2022-07-20 MED ORDER — DOXYCYCLINE HYCLATE 100 MG PO TABS
100.0000 mg | ORAL_TABLET | Freq: Every day | ORAL | 1 refills | Status: DC
Start: 2022-07-20 — End: 2022-07-20

## 2022-07-20 MED ORDER — MINOCYCLINE HCL 50 MG PO CAPS: 50.0000 mg | ORAL_CAPSULE | Freq: Two times a day (BID) | ORAL | 0 refills | Status: AC

## 2022-07-20 NOTE — Telephone Encounter (Signed)
[employer] Chief Technology Officer.   07/20/2022    Referral Reason: Hospice    Social worker received referral from Celesta Gentile, Charity fundraiser. Social worker placed call to patient's daughter to discuss. Per patient's daughter, patient is still at [facility] for skilled care. They are planning discharge for tomorrow but are unsure at this time. Social worker was speaking with patient's daughter and Child psychotherapist at the skilled nursing facility, and at this time the Child psychotherapist at the skilled nursing facility will assist with hospice. Patient's daughter still wanted this Clinical research associate to send her hospice agencies via email. Social worker sent hospice agencies per daughter's request. Social worker also provided daughter with this writer's phone number, in the event needs arise after discharge.Social worker notified Celesta Gentile, RN of deferment of referral.    Chauncy Lean, MSW  Social Work Case Production designer, theatre/television/film I  [employer]  [phone]

## 2022-07-20 NOTE — Progress Notes (Signed)
HISTORY OF PRESENT ILLNESS    [name] Wall is 88 y.o. 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  She was previously treated with NBUVB.  I met her in November 2022 when she relocated to [facility] and noted Stage 1B, limited thin patch disease.  The patient is post-CVA and in her 90's making phototherapy challenging but doable. Initially stable on TAC 0.1% prn and NBUVB twice weekly.     In early January 2023 developed tumor stage MF, no LCT, scattered CD 30+.  In Feb 2023, morphologic change to plaques/tumors on LEs and new inguinal LAN were noted. Flow and PET updated. Opted to pursue Brentuximab treatment.     Patient has had 3 infusions of Brentuximab (1.8 mg/kg IV q 3weeks, last 02/09/22) with  stable disease and poor tolerance.  In August 2023, POD.  Patient declined additional therapy.  In late September 2023, her Aes from brentuximab had cleared and she progressed to large bulky nodal and subcutaneous disease primarily on the right leg.  Received doxil 06/25/2022 with significant toxicity necessitating admission and discharge to SNF.      Today, she is weak and lethargic.  Daughters note poor appetite.  Open malodorous leg wounds.     No history of other skin cancers. Negative family history of skin cancer and lymphoma.       Current Outpatient Medications:     ALPRAZolam (XANAX) 0.25 MG tablet, Take 1 tablet (0.25 mg) by mouth daily as needed for Anxiety, Disp: 30 tablet, Rfl: 0    amLODIPine (NORVASC) 10 MG tablet, Take 1 tablet (10 mg) by mouth daily, Disp: , Rfl:     aspirin 81 MG EC tablet, Take 1 tablet (81 mg) by mouth, Disp: , Rfl:     atorvastatin (LIPITOR) 40 MG tablet, Take 1 tablet (40 mg) by mouth every 24 hours, Disp: , Rfl:     ferrous gluconate (FERGON) 324 MG tablet, Take 1 tablet (324 mg) by mouth every morning with breakfast, Disp: 90 tablet, Rfl: 3    gentamicin (GARAMYCIN) 0.1 % ointment, , Disp: , Rfl:     hydrALAZINE (APRESOLINE) 50 MG tablet, Take 1 tablet (50 mg) by mouth 3  (three) times daily with meals, Disp: , Rfl:     hydrOXYzine (ATARAX) 10 MG tablet, Take 1 tablet (10 mg) by mouth every 8 (eight) hours as needed for Itching, Disp: 30 tablet, Rfl: 1    imiquimod (ALDARA) 5 % cream, Apply nightly three times a week (Monday, Wednesday, Friday) to behind left knee and right ankle, Disp: 96 each, Rfl: 2    latanoprost (XALATAN) 0.005 % ophthalmic solution, , Disp: , Rfl:     levETIRAcetam (KEPPRA) 500 MG tablet, Take 1 tablet (500 mg) by mouth 2 (two) times daily, Disp: , Rfl:     levothyroxine (SYNTHROID) 25 MCG tablet, Take 1 tablet (25 mcg) by mouth daily, Disp: , Rfl:     losartan (COZAAR) 50 MG tablet, Take 1 tablet (50 mg) by mouth daily, Disp: , Rfl:     metoclopramide (REGLAN) 5 MG tablet, Take 1 tablet (5 mg) by mouth 4 (four) times daily as needed (nausea or vomiting), Disp: 90 tablet, Rfl: 0    mirtazapine (REMERON) 7.5 MG tablet, Take 1 tablet (7.5 mg) by mouth nightly, Disp: 30 tablet, Rfl: 11    OLANZapine (ZyPREXA) 2.5 MG tablet, Take 1 tablet (2.5 mg) by mouth nightly, Disp: 30 tablet, Rfl: 3    pantoprazole (PROTONIX) 40 MG tablet, Take 1  tablet (40 mg) by mouth daily, Disp: 90 tablet, Rfl: 1    SSD 1 % cream, , Disp: , Rfl:     tacrolimus (PROTOPIC) 0.1 % ointment, Apply topically, Disp: , Rfl:     triamcinolone (KENALOG) 0.1 % cream, Apply topically, Disp: , Rfl:     triamcinolone (KENALOG) 0.1 % ointment, as needed, Disp: , Rfl:     doxycycline (VIBRA-TABS) 100 MG tablet, Take 1 tablet (100 mg) by mouth daily, Disp: 30 tablet, Rfl: 1      Allergies   Allergen Reactions    Penicillins      Past Medical History:   Diagnosis Date    Hypercholesteremia     Hypertension     Seizures     Stroke      Past Surgical History:   Procedure Laterality Date    HAND, CLOSED REDUCTION  01/29/2019    right wrist     Family History   Problem Relation Age of Onset    Breast cancer Sister      Social History     Socioeconomic History    Marital status: Widowed   Tobacco Use    Smoking  status: Never    Smokeless tobacco: Never   Vaping Use    Vaping Use: Never used   Substance and Sexual Activity    Alcohol use: Not Currently    Drug use: Never    Sexual activity: Not Currently     PHYSICAL EXAM:  Vitals:    07/20/22 1103   BP: 96/58   Pulse: (!) 104   Resp: 16   Temp: 97.4 F (36.3 C)   SpO2: 100%       Note.General appearance: NAD, conversant.A total body skin examination is completed including palpation of scalp and inspection of hair of scalp, eyebrows, face, chest and extremities and inspection of eccrine and apocrine glands. The following anatomic skin sites are examined with findings recorded:  Head including face - diffuse hyperpigmentation of forehead  Neck - no lesions of concern  Right lower extremity -  multiple open necrotic oozing malodorous tumors  Left lower extremity - scattered hyperpigmented patches on upper and lower leg     There are  multiple 3cm and 2cm subcutaneous firm masses in the right inguinal nodal basin extending down the right thigh into popliteal fossa    Pathology reports     07/14/18        Procedure Date & Time: 11/03/2021, 09:00                              FLOW CYTOMETRY REPORT          FLOW INTERPRETATION:          PERIPHERAL BLOOD: NO IMMUNOPHENOTYPIC ABNORMALITIES IDENTIFIED          COMMENT:     A population (12%) of lymphocytes is detected. About 53% of these cells     are CD3+ T cells and 30% are NK cells. Expressed as a percentage of     total T cells, CD4+/CD7-T cells are 4.0%, and CD4+/CD26-T cells are     3.9%. The calculated CD4/CD8 ratio is about 5.7. 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 Correlation with     morphology, clinical findings and other laboratory studies is     recommended for a complete evaluation.        PET  CT WHOLE BODY     HISTORY: Mycosis fungoides, primarily cutaneous lymphoma (diagnosed in  2019). Post imiquimod, which has been held since 10/01/2021. Restaging.     TECHNIQUE:      Dose: 9.99   mCi F-18 fluorodeoxyglucose (F-18 FDG)  Serum Glucose: 104 mg/dL  Time from Z-61 FDG injection to scan: 62 min     The patient fasted prior to this examination for greater than six hours.  Positron emission tomography (PET) images were obtained from the convexity  of the calvarium through the feet. As part of this examination, a  nondiagnostic CT scan without intravenous contrast was obtained over the  same region utilizing a low tube current technique for anatomical  correlation and attenuation correction. Oral contrast was provided. Images  were reconstructed in the axial, sagittal and coronal planes.     CORRELATION: None available.     FINDINGS:      Mediastinum: SUV max 2.4  Liver: SUV max 3.2     HEAD AND NECK: Normal FDG distribution within the nasopharynx, oropharynx,  larynx and small sized thyroid gland. Limited evaluation of the brain  parenchyma demonstrates no discrete focus of increased FDG uptake. No FDG  avid cervical or supraclavicular lymph nodes. Minimal leftward deviation of  the nasal septum.     THORAX: No hypermetabolic foci within the lung parenchyma. Mildly FDG avid  subcentimeter bilateral axillary lymph nodes, likely inflammatory/reactive.  No other FDG avid thoracic lymph nodes. Coronary artery calcifications.  Aortic valvular calcifications. Small hiatal hernia. Bibasilar bandlike  linear atelectasis or scarring.     ABDOMEN AND PELVIS:   *  FDG avid right external iliac node (image 239): 0.4 x 0.7 cm with SUV  max 4.7  *  FDG avid right inguinal nodes, for example on image 243: up to 1.5 x  0.7 cm with SUV max up to 3.4     Mildly FDG avid subcentimeter left inguinal lymph nodes, likely  reactive/inflammatory.     Normal FDG distribution within the liver, spleen, mild atrophic pancreas,  and adrenal glands. Cholelithiasis. Non-FDG avid exophytic right renal  cyst, possibly containing a septation, measuring 6.0 x 4.9 cm (image 190).  Colonic diverticula, especially within the sigmoid  colon, without evidence  of diverticulitis. Multiple pelvic phleboliths.     Below the knee misregistration somewhat limits evaluation.  MUSCULOSKELETAL/EXTREMITIES:   No hypermetabolic foci within the bone marrow. Multilevel spinal  degenerative changes. No aggressive lytic or sclerotic lesion. Right total  hip arthroplasty, with associated surrounding increased uptake, likely  inflammatory. Moderate right and mild left leg subcutaneous edema with mild  increased uptake, possibly inflammatory.     CUTANEOUS/SUBCUTANEOUS:   Tiny cutaneous foci of uptake in the lateral aspect of the right hemipelvis  (images 213-222), for example, on image 220: up to 0.2 cm in thickness with  SUV max up to 3.3     FDG avid subcutaneous nodules, with reference examples as follows:  *  Right inguinal/proximal right thigh subcutaneous soft tissue nodule  (image 268): 1.9 x 2.2 cm with SUV max 6.6  *  Proximal right thigh anteromedial subcutaneous soft tissue nodule  (image 279): 2.7 x 3.6 cm with SUV max 14.7     Multiple cutaneous foci of uptake in the right lower extremity, below the  knee, with reference examples as follows:  *  Curvilinear cutaneous uptake along the anterolateral mid right leg  (image 427): 0.5 cm in thickness with SUV max 5.1  *  Cutaneous uptake within the anteromedial aspect of the mid right leg  (image 431): 0.2 cm in thickness with SUV max 5.2  *  Cutaneous uptake along the medial and posteromedial right leg (images  449-477): up to 0.4 cm in thickness with SUV max up to 7.7     Cutaneous/subcutaneous foci of uptake within the left lower extremity, as  follows:  *  Cutaneous focus of uptake within the posterior distal left thigh  (image 349): 0.3 cm in thickness with SUV max 3.5  *  At least 3 foci of cutaneous uptake lateral to the left knee (image  [phone]), for example on image 372: up to 0.4 cm in thickness with SUV max  up to 9.3  *  Cutaneous/subcutaneous focus of uptake within the mid left leg  (image  425): 0.5 x 0.3 cm with SUV max 4.7     IMPRESSION:      1. FDG avid cutaneous foci of uptake within the bilateral lower  extremities, particularly at and below the knees, is consistent with  malignancy.  2. FDG avid subcutaneous nodules within the right inguinal/right proximal  thigh region is consistent with malignancy.  3. FDG avid right inguinal and right external iliac lymph nodes are  suspicious for nodal metastatic disease.    Assessment & Plan:   Pt is a FST66 female with Stage 2B CTCL (T3, N2, M0, B0)  MF subtype; last seen 2 weeks ago.  Wound care provider directed the patient to RTC.    Stage 2B CTCL, MF subtype  - Morphologic changes and LAN emerged February 2023, now s/p Brentuximab x3 infusions (last 02/09/22), still receiving nbUVB 2x/week  - Biopsy shows CD30+ tumors  - PET CT Feb 2023 shows diffuse skin lesions and dermatopathic vs. Early malignancy in skin draining inguinal nodes  - Flow, TCR, CBC Feb 2023 without evidence of blood tumor burden  - Right groin with multiple bulky subcutaneous masses tracking down the nodal basin c/w nodal disease  - Acute decompensation s/p doxil x 1  - Transition to home hospice  - Family meeting today, counseling provided, hospice and wound care ordered     2. Tumor breakdown right LE  - Wound culture  - Minocycline 50mg  po bid      Ferne Reus, MD  Director, Cutaneous Lymphoma and   High Risk/Transplant Dermatology   [facility]  [facility]  [address]  T [phone]  F [phone]  [email or web address]

## 2022-07-20 NOTE — Telephone Encounter (Signed)
Forwarded minocycline order placed today by Dr. Bryson Ha to [facility] [address] [place] nursing station as requested by the patients daughter, [name]. [facility] confirmed receipt.     [name] has spoken with [facility] and its understood [facility] Team will facilitate hospice for discharge from rehab, planning discharge for Thursday. [name] understands [employer] Team has spoken with hospice and they are willing and able to call her to provide information and follow back up once discharged. Both agencies [employer] [facility]  and [facility] said that they would coordinate with the Child psychotherapist at [place] if needed.     At this time [name] had no further questions or concerns.

## 2022-07-21 LAB — URINALYSIS, REFLEX TO MICROSCOPIC EXAM IF INDICATED
Bilirubin, UA: NEGATIVE
Blood, UA: NEGATIVE
Glucose, UA: NEGATIVE
Ketones UA: NEGATIVE
Leukocyte Esterase, UA: NEGATIVE
Nitrite, UA: NEGATIVE
Specific Gravity UA: 1.016 (ref 1.001–1.035)
Urine pH: 6 (ref 5.0–8.0)
Urobilinogen, UA: NORMAL mg/dL

## 2022-07-22 ENCOUNTER — Telehealth: Payer: Self-pay

## 2022-07-22 ENCOUNTER — Encounter: Payer: Self-pay | Admitting: Student in an Organized Health Care Education/Training Program

## 2022-07-22 LAB — SPECIMEN STATUS REPORT

## 2022-07-22 NOTE — Telephone Encounter (Signed)
[facility] Social Work Case Manager  07/22/2022    Social worker placed call to patient's daughter to confirm discharge from skilled nursing and admission to hospice. Per daughter, patient is home with her and patient is on service with [facility]. Patient's daughter informed social worker that all equipment was delivered and there are no additional concerns at this time.    Chauncy Lean, MSW  Social Work Case Production designer, theatre/television/film I  [employer]  [phone]

## 2022-07-22 NOTE — Progress Notes (Signed)
[facility]       Date Time: July 22, 2022   Patient Name: [name] Wall,[name] Wall   Referring Provider: Referring, Not On File,*    Primary Dx: Mycosis Fungoides, stage 2B      ONCOLOGY CARE TEAM  Dermatology: Russella Dar, MD  Derm Oncology: Ferne Reus, MD  Radiation Oncology:  Quentin Mulling, MD      HISTORY OF PRESENT ILLNESS  [name] Wall is 88 y.o. female h/o HTN, HLD, CVA with chronic R-sided weakness, and Mycosis Fungoides since 2019. She was previously treated with NBUVB.  She established care here at [facility] in November 2022 when she relocated to [facility] for MF. She developed tumor stage MF in Jan 2023 despite NVUBV, therefore, consensus recommendation was to treat with brentuximab.     She's here follow s/p C1 palliative Doxil C1D1, in the company of her daughters and son by phone. Patient was recently hospitalized at [facility] immediately after this infusion for acute weakness, new rash, fever >101, leukocytosis and lip swelling, no localizing signs/symptoms of infection, so felt to be an acute drug reaction to the Doxil infusion 9/29. This occurred despite benadryl 25mg  + dex 10mg  IV premedications. Patient was recovering well, all abx stopped, rash improving, fever abated, but still weak, and thus was discharged to a SAR. She has continued to decline in that time and has worsening weakness, mentation, ambulation, and oral intake. The wounds on her LLE have also dramatically worsened with full thickness skin loss.    She has chronic left side weakness from CVA in 2022, and R hand/wrist weakness s/p broken wrist, now entirely wheelchair bound. Very minimally communicative today, mostly sleeping or wincing when examined.    No history of other skin cancers. Negative family history of skin cancer and lymphoma.     MEDICATIONS:    Current Outpatient Medications:     ALPRAZolam (XANAX) 0.25 MG tablet, Take 1 tablet (0.25 mg) by mouth daily as needed for Anxiety, Disp: 30 tablet,  Rfl: 0    amLODIPine (NORVASC) 10 MG tablet, Take 1 tablet (10 mg) by mouth daily, Disp: , Rfl:     aspirin 81 MG EC tablet, Take 1 tablet (81 mg) by mouth, Disp: , Rfl:     atorvastatin (LIPITOR) 40 MG tablet, Take 1 tablet (40 mg) by mouth every 24 hours, Disp: , Rfl:     ferrous gluconate (FERGON) 324 MG tablet, Take 1 tablet (324 mg) by mouth every morning with breakfast, Disp: 90 tablet, Rfl: 3    gentamicin (GARAMYCIN) 0.1 % ointment, , Disp: , Rfl:     hydrALAZINE (APRESOLINE) 50 MG tablet, Take 1 tablet (50 mg) by mouth 3 (three) times daily with meals, Disp: , Rfl:     hydrOXYzine (ATARAX) 10 MG tablet, Take 1 tablet (10 mg) by mouth every 8 (eight) hours as needed for Itching, Disp: 30 tablet, Rfl: 1    imiquimod (ALDARA) 5 % cream, Apply nightly three times a week (Monday, Wednesday, Friday) to behind left knee and right ankle, Disp: 96 each, Rfl: 2    latanoprost (XALATAN) 0.005 % ophthalmic solution, , Disp: , Rfl:     levETIRAcetam (KEPPRA) 500 MG tablet, Take 1 tablet (500 mg) by mouth 2 (two) times daily, Disp: , Rfl:     levothyroxine (SYNTHROID) 25 MCG tablet, Take 1 tablet (25 mcg) by mouth daily, Disp: , Rfl:     losartan (COZAAR) 50 MG tablet, Take 1 tablet (50 mg) by mouth  daily, Disp: , Rfl:     metoclopramide (REGLAN) 5 MG tablet, Take 1 tablet (5 mg) by mouth 4 (four) times daily as needed (nausea or vomiting), Disp: 90 tablet, Rfl: 0    minocycline (MINOCIN) 50 MG capsule, Take 1 capsule (50 mg) by mouth 2 (two) times daily for 14 days, Disp: 28 capsule, Rfl: 0    mirtazapine (REMERON) 7.5 MG tablet, Take 1 tablet (7.5 mg) by mouth nightly, Disp: 30 tablet, Rfl: 11    OLANZapine (ZyPREXA) 2.5 MG tablet, Take 1 tablet (2.5 mg) by mouth nightly, Disp: 30 tablet, Rfl: 3    pantoprazole (PROTONIX) 40 MG tablet, Take 1 tablet (40 mg) by mouth daily, Disp: 90 tablet, Rfl: 1    SSD 1 % cream, , Disp: , Rfl:     tacrolimus (PROTOPIC) 0.1 % ointment, Apply topically, Disp: , Rfl:     triamcinolone  (KENALOG) 0.1 % cream, Apply topically, Disp: , Rfl:     triamcinolone (KENALOG) 0.1 % ointment, as needed, Disp: , Rfl:     Allergies   Allergen Reactions    Penicillins      Past Medical History:   Diagnosis Date    Hypercholesteremia     Hypertension     Seizures     Stroke      Past Surgical History:   Procedure Laterality Date    HAND, CLOSED REDUCTION  01/29/2019    right wrist     Family History   Problem Relation Age of Onset    Breast cancer Sister      Social History     Socioeconomic History    Marital status: Widowed   Tobacco Use    Smoking status: Never    Smokeless tobacco: Never   Vaping Use    Vaping Use: Never used   Substance and Sexual Activity    Alcohol use: Not Currently    Drug use: Never    Sexual activity: Not Currently     PHYSICAL EXAM:  Vitals:    07/20/22 1104   BP: 96/58   Pulse: (!) 104   Resp: 16   Temp: 97.4 F (36.3 C)   SpO2: 100%     Wt Readings from Last 3 Encounters:   06/25/22 48.1 kg (106 lb)   06/08/22 48.3 kg (106 lb 6.4 oz)   06/08/22 48.3 kg (106 lb 6.4 oz)     ECOG PS: 1-2  General: NAD, seated in wheelchair  Eyes: Anicteric, normal conjunctiva  ENT: No sores in mouth  CV: RRR  RESP: CTA B  GI: NT/ND, no HSM  Neuro: AAO x 3, minimally communicative, mostly sleeping, occasionally wincing when examined  MS: no swelling in arms or legs  SKIN:   - Head including face - diffuse hyperpigmentation of forehead, thinning hair with receding hair line  - Right lower extremity -  multiple hyperpigmented open plaques on right shin, distal posterior calf and ankle (WORSENING, now tumor stage with chronic full thickness skin loss). Anterior mid shin and anterior/medial ankle plaques with large, deep erosions w/o surrounding erythema or tenderness (WORSENING); scattered hyperpigmented patches on right thigh  - R inner thigh with macerated, wrinkled skin (early skin breakdown)  - Right upper extremity - single hyperpigmented patch on right forearm  - Abdomen - hyperpigmented plaque on  RLQ with open erosion (WORSENING)   LYMPH: +multiple R subcutaneous (3cm + 5cm + 5.5cm) lymph node conglomerates and inguinal adenopathy (STABLE/SLIGHTLY IMPROVED); negative cervical suplarclavicular axillary  07/20/22            04/13/22            02/09/22        01/19/22            12/29/21  No significant interval change in MF  No sign of infection in right posterior ankle open wound    12/14/21              LABS:  Lab Results   Component Value Date    WBC 13.80 (H) 07/20/2022    HGB 9.0 (L) 07/20/2022    HCT 28.2 (L) 07/20/2022    MCV 77.7 (L) 07/20/2022    PLT 506 (H) 07/20/2022     Lab Results   Component Value Date    CREAT 0.8 07/20/2022    BUN 13.0 07/20/2022    NA 136 07/20/2022    K 4.5 07/20/2022    CL 101 07/20/2022    CO2 27 07/20/2022     Lab Results   Component Value Date    ALT 40 07/20/2022    AST 44 (H) 07/20/2022    ALKPHOS 67 07/20/2022    BILITOTAL 0.4 07/20/2022       CARDIAC PROCEDURES:  TTE 06/12/22  Summary    * The left ventricle is small. Sigmoid septum, mild basal septal  hypertrophy.    * There is mild concentric left ventricular hypertrophy.    * Left ventricular systolic function is normal with an ejection fraction by  Biplane Method of Discs of  66 %.    * Left ventricular segmental wall motion is normal.    * Left ventricular diastolic filling parameters are consistent with Grade I  diastolic dysfunction (impaired relaxation pattern).    * The right ventricular cavity size is normal in size.    * Normal right ventricular systolic function.    * The left atrium is normal in size.    * The right atrium is normal in size.    * The aortic valve is tricuspid. Non coronary cusp is fixed.    * There is mild aortic stenosis with a peak velocity of 2.1 m/s, mean  gradient of 10 mmHg, a dimensionless index of 0.46, and an aortic area of 1.6  cm2 by Continuity Equation.  The indexed area is 1.1 cm2/m2.    * There is mild aortic regurgitation.    * There is mild mitral regurgitation.    * There is  mild tricuspid regurgitation.    * No pulmonary hypertension with estimated right ventricular systolic  pressure of  33 mmHg.    * The IVC is normal in size with > 50% respiratory variance consistent with  normal RA pressure of 3 mmHg.    * No prior study is available for comparison.      IMAGING:  PET CT WHOLE BODY 11/19/2021  FINDINGS:   Mediastinum: SUV max 2.4  Liver: SUV max 3.2     HEAD AND NECK: Normal FDG distribution within the nasopharynx, oropharynx,  larynx and small sized thyroid gland. Limited evaluation of the brain  parenchyma demonstrates no discrete focus of increased FDG uptake. No FDG  avid cervical or supraclavicular lymph nodes. Minimal leftward deviation of  the nasal septum.     THORAX: No hypermetabolic foci within the lung parenchyma. Mildly FDG avid  subcentimeter bilateral axillary lymph nodes, likely inflammatory/reactive.  No other FDG avid thoracic lymph nodes. Coronary artery calcifications.  Aortic  valvular calcifications. Small hiatal hernia. Bibasilar bandlike  linear atelectasis or scarring.     ABDOMEN AND PELVIS:   *  FDG avid right external iliac node (image 239): 0.4 x 0.7 cm with SUV  max 4.7  *  FDG avid right inguinal nodes, for example on image 243: up to 1.5 x  0.7 cm with SUV max up to 3.4     Mildly FDG avid subcentimeter left inguinal lymph nodes, likely  reactive/inflammatory.     Normal FDG distribution within the liver, spleen, mild atrophic pancreas,  and adrenal glands. Cholelithiasis. Non-FDG avid exophytic right renal  cyst, possibly containing a septation, measuring 6.0 x 4.9 cm (image 190).  Colonic diverticula, especially within the sigmoid colon, without evidence  of diverticulitis. Multiple pelvic phleboliths.     Below the knee misregistration somewhat limits evaluation.    MUSCULOSKELETAL/EXTREMITIES:   No hypermetabolic foci within the bone marrow. Multilevel spinal  degenerative changes. No aggressive lytic or sclerotic lesion. Right total  hip  arthroplasty, with associated surrounding increased uptake, likely  inflammatory. Moderate right and mild left leg subcutaneous edema with mild  increased uptake, possibly inflammatory.     CUTANEOUS/SUBCUTANEOUS:   Tiny cutaneous foci of uptake in the lateral aspect of the right hemipelvis  (images 213-222), for example, on image 220: up to 0.2 cm in thickness with  SUV max up to 3.3     FDG avid subcutaneous nodules, with reference examples as follows:  *  Right inguinal/proximal right thigh subcutaneous soft tissue nodule  (image 268): 1.9 x 2.2 cm with SUV max 6.6  *  Proximal right thigh anteromedial subcutaneous soft tissue nodule  (image 279): 2.7 x 3.6 cm with SUV max 14.7     Multiple cutaneous foci of uptake in the right lower extremity, below the  knee, with reference examples as follows:  *  Curvilinear cutaneous uptake along the anterolateral mid right leg  (image 427): 0.5 cm in thickness with SUV max 5.1  *  Cutaneous uptake within the anteromedial aspect of the mid right leg  (image 431): 0.2 cm in thickness with SUV max 5.2  *  Cutaneous uptake along the medial and posteromedial right leg (images  449-477): up to 0.4 cm in thickness with SUV max up to 7.7     Cutaneous/subcutaneous foci of uptake within the left lower extremity, as  follows:  *  Cutaneous focus of uptake within the posterior distal left thigh  (image 349): 0.3 cm in thickness with SUV max 3.5  *  At least 3 foci of cutaneous uptake lateral to the left knee (image  [phone]), for example on image 372: up to 0.4 cm in thickness with SUV max  up to 9.3  *  Cutaneous/subcutaneous focus of uptake within the mid left leg (image  425): 0.5 x 0.3 cm with SUV max 4.7     IMPRESSION:   1. FDG avid cutaneous foci of uptake within the bilateral lower  extremities, particularly at and below the knees, is consistent with  malignancy.  2. FDG avid subcutaneous nodules within the right inguinal/right proximal  thigh region is consistent with  malignancy.  3. FDG avid right inguinal and right external iliac lymph nodes are  suspicious for nodal metastatic disease.    PATHOLOGY    07/14/18            Procedure Date & Time: 11/03/2021, 09:00  FLOW CYTOMETRY REPORT          FLOW INTERPRETATION:          PERIPHERAL BLOOD: NO IMMUNOPHENOTYPIC ABNORMALITIES IDENTIFIED          COMMENT:     A population (12%) of lymphocytes is detected. About 53% of these cells     are CD3+ T cells and 30% are NK cells. Expressed as a percentage of     total T cells, CD4+/CD7-T cells are 4.0%, and CD4+/CD26-T cells are     3.9%. The calculated CD4/CD8 ratio is about 5.7. 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 Correlation with     morphology, clinical findings and other laboratory studies is     recommended for a complete evaluation.       Assessment & Plan:   [name] Wall is 88 y.o. FST5 female h/o HTN, HLD, CVA with chronic R-sided weakness, and Mycosis Fungoides since 2019. She recently developed new nodules and lymphadenopathy with biopsy proven advancement to tumor stage disease, Stage 2B CTCL, MF subtype. Given scattered CD30+ disease, we recommended patient to receive brentuximab, s/p 3 cycles with mixed response to date, now with clinically progressive plaques and lymph node conglomerates on the RLE. Attempted Doxil 20mg /m2 x1 infusion, complicated by hypersensitivity reaction, hospitalization, acute decline in PS, mentation, oral intake, and overall health.    [name] is mostly concerned that the wounds on the lower extremity continue to worsen with new hyperpigmented lesions on the R foot (see photos), which I described is attributable to her CTCL. We discussed that the patient is in the very unfavorable position of having aggressive disease and a very poor constitution to tolerate systemic treatments. There are no easy solution in such a situation.      We acknowledged that she is in the final days and  weeks of her life, and that our decisions should be animated by this stark fact, asking ourselves how to optimize the remaining time we have with [facility]. The most important thing we can do is not worsen her already fragile QOL and accelerate her death through aggressive treatments. Overall, the patient and her daughter want her home and out of SAR, acknowledging that she is unable to improve to her prior state of functioning. With that in mind, I recommended that our best option at this point is home hospice, accepting that her skin disease will worsen, with resulting pain, infection, sepsis and death. While systemic treatments may delay some of this progression, her history of poor tolerance of chemotherapy to date suggests it will cause more harm than good.      Stage 2B CTCL, MF subtype  - PET CT shows diffuse skin lesions and sub cutaneous masses near skin draining R inguinal nodes  - Flow, TCR, CBC without evidence of blood tumor burden; will recheck flow with the start of treatment today  - s/p Brentuximab 1.8mg /kg IV q3wks x3 doses, c/b fatigue, nausea, anorexia, weight loss, limiting further treatment  - s/p Doxil 20mg /m2 x1 infusion, complicated by hypersensitivity reaction, hospitalization, acute decline in PS, mentation, oral intake, and overall health  - Defer any additional systemic therapies; we will help arrange contact with a hospice agency to help transition the patient out of SAR and into home hospice care  - Recommend oral morphing 5mg  PO q3-4hrs PRN and minocycline 100mg  PO BID ongoing to try and control pain and infection, respectively  - Appreciate co-management with Dr. Bryson Ha  RTC PRN        Ervine Witucki Al-Mondhiry, MD, MA  Medical Oncologist, Cutaneous Malignancies Program  Assistant Professor of Medical Education, [employer]  [facility]  [address], [id number]  [place], [place] [address]  Phone: [phone]  Fax: [phone]

## 2022-07-24 LAB — AEROBIC BACTERIAL CULTURE

## 2022-07-26 LAB — AEROBIC BACTERIAL CULTURE

## 2022-07-27 ENCOUNTER — Other Ambulatory Visit: Payer: Self-pay

## 2022-07-27 ENCOUNTER — Other Ambulatory Visit: Payer: Medicare Other

## 2022-07-27 ENCOUNTER — Telehealth: Payer: Self-pay

## 2022-07-27 ENCOUNTER — Ambulatory Visit: Payer: Medicare Other

## 2022-07-27 DIAGNOSIS — L089 Local infection of the skin and subcutaneous tissue, unspecified: Secondary | ICD-10-CM

## 2022-07-27 DIAGNOSIS — C8405 Mycosis fungoides, lymph nodes of inguinal region and lower limb: Secondary | ICD-10-CM

## 2022-07-27 MED ORDER — CEPHALEXIN 500 MG PO CAPS: 500.0000 mg | ORAL_CAPSULE | Freq: Two times a day (BID) | ORAL | 0 refills | Status: AC

## 2022-07-27 NOTE — Telephone Encounter (Signed)
The result of her wound culture was reviewed and signed off by Dr Bryson Ha. Reviewed with daughter [name]. Dr Bryson Ha advised to have daughter hold off on giving her minocycline (resistant organism) instead eRx Keflex 500 mg po bid for 10 days. Daughter said that patient is now under hospice care because of her wounds. She said that it is looking better. She did report that patient has been constipated for at least 4 days now. She was advised to mentioned that to [facility] hospice nurse. But wants to get order from a doctor. Dr Bryson Ha made aware. She advised to reach out to hospice for management of her constipation.

## 2022-08-06 ENCOUNTER — Ambulatory Visit: Payer: Medicare Other

## 2022-08-06 ENCOUNTER — Other Ambulatory Visit: Payer: Medicare Other

## 2022-08-25 ENCOUNTER — Telehealth: Payer: Self-pay

## 2022-08-25 NOTE — Telephone Encounter (Signed)
Called [facility] for status update on patients current admission to [facility]. Vitas explained patient is still actively admitted to [facility]osp[facility] at this time.   Notified Clinic RN, LWC, and SW.

## 2022-08-27 ENCOUNTER — Ambulatory Visit: Payer: Medicare Other

## 2022-08-27 ENCOUNTER — Other Ambulatory Visit: Payer: Medicare Other

## 2022-09-09 ENCOUNTER — Telehealth: Payer: Self-pay

## 2022-09-09 NOTE — Telephone Encounter (Signed)
[facility] Social Work Case Manager  09/09/2022    Social worker received incoming call from [place] with [facility], informing that patient passed away on Sep 07, 2022. Social worker notified care team.    Chauncy Lean, MSW  Social Work Case Manager I  Customer service manager [facility]  [phone]

## 2022-09-27 DEATH — deceased
